# Patient Record
Sex: Female | Born: 1960 | Race: White | Hispanic: No | Marital: Single | Smoking: Former smoker
Health system: Southern US, Community
[De-identification: ages and names within clinical notes are randomized; demographics above are authoritative.]

## PROBLEM LIST (undated history)

## (undated) DIAGNOSIS — I10 Essential (primary) hypertension: Secondary | ICD-10-CM

## (undated) DIAGNOSIS — I509 Heart failure, unspecified: Secondary | ICD-10-CM

## (undated) DIAGNOSIS — J449 Chronic obstructive pulmonary disease, unspecified: Secondary | ICD-10-CM

## (undated) DIAGNOSIS — I2510 Atherosclerotic heart disease of native coronary artery without angina pectoris: Secondary | ICD-10-CM

## (undated) HISTORY — DX: Heart failure, unspecified: I50.9

## (undated) HISTORY — PX: ABDOMINAL HYSTERECTOMY: SHX81

---

## 2014-04-05 ENCOUNTER — Emergency Department: Payer: Self-pay | Admitting: Emergency Medicine

## 2014-04-05 LAB — TROPONIN I: Troponin-I: 0.76 ng/mL — ABNORMAL HIGH

## 2014-04-05 LAB — BASIC METABOLIC PANEL
Anion Gap: 5 — ABNORMAL LOW (ref 7–16)
BUN: 15 mg/dL (ref 7–18)
Calcium, Total: 8.2 mg/dL — ABNORMAL LOW (ref 8.5–10.1)
Chloride: 107 mmol/L (ref 98–107)
Co2: 29 mmol/L (ref 21–32)
Creatinine: 0.68 mg/dL (ref 0.60–1.30)
Glucose: 89 mg/dL (ref 65–99)
Osmolality: 282 (ref 275–301)
Potassium: 4.6 mmol/L (ref 3.5–5.1)
Sodium: 141 mmol/L (ref 136–145)

## 2014-04-05 LAB — CBC
HCT: 41.8 % (ref 35.0–47.0)
HGB: 13.8 g/dL (ref 12.0–16.0)
MCH: 29.4 pg (ref 26.0–34.0)
MCHC: 33 g/dL (ref 32.0–36.0)
MCV: 89 fL (ref 80–100)
Platelet: 200 10*3/uL (ref 150–440)
RBC: 4.69 10*6/uL (ref 3.80–5.20)
RDW: 13.6 % (ref 11.5–14.5)
WBC: 7.8 10*3/uL (ref 3.6–11.0)

## 2014-04-05 LAB — CK-MB: CK-MB: 5.2 ng/mL — ABNORMAL HIGH (ref 0.5–3.6)

## 2016-01-29 ENCOUNTER — Ambulatory Visit
Admission: RE | Admit: 2016-01-29 | Discharge: 2016-01-29 | Disposition: A | Discharge status: Home / Self Care | Payer: Self-pay | Source: Ambulatory Visit | Attending: Internal Medicine | Admitting: Internal Medicine

## 2016-01-29 ENCOUNTER — Ambulatory Visit: Payer: Self-pay | Attending: Internal Medicine

## 2016-01-29 VITALS — BP 113/78 | HR 87 | Temp 98.4°F | Ht 65.35 in | Wt 198.0 lb

## 2016-01-29 DIAGNOSIS — Z0000 Encounter for general adult medical examination without abnormal findings: Secondary | ICD-10-CM

## 2016-01-29 NOTE — Progress Notes (Signed)
Subjective:     Patient ID: Virginia Norman, female   DOB: 1961/03/04, 55 y.o.   MRN: [id number]  HPI   Review of Systems     Objective:   Physical Exam  Pulmonary/Chest: Right breast exhibits no inverted nipple, no mass, no nipple discharge, no skin change and no tenderness. Left breast exhibits no inverted nipple, no mass, no nipple discharge, no skin change and no tenderness. Breasts are symmetrical.       Assessment:     54 year oldpatient presents for BCCCP clinic visitPatient screened, and meets BCCCP eligibility.  Patient does not have insurance, Medicare or Medicaid.  Handout given on Affordable Care Act.  Instructed patient on breast self-exam using teach back method. CBE unremarkable.  No mass or lump palpated.  Patient moved her from [place] in 2001.  States she had mammogram at [facility] about 2005.  She had a total hysterectomy in 2012.    Plan:     Sent for bilateral screening mammogram.

## 2016-02-19 NOTE — Progress Notes (Signed)
Letter mailed from [facility] to notify of normal mammogram results.  Patient to return in one year for annual screening.  Copy to [facility]IS. 

## 2017-09-15 ENCOUNTER — Encounter (INDEPENDENT_AMBULATORY_CARE_PROVIDER_SITE_OTHER): Payer: Self-pay

## 2017-09-15 ENCOUNTER — Ambulatory Visit: Payer: Self-pay | Attending: Oncology | Admitting: *Deleted

## 2017-09-15 ENCOUNTER — Ambulatory Visit
Admission: RE | Admit: 2017-09-15 | Discharge: 2017-09-15 | Disposition: A | Discharge status: Home / Self Care | Payer: Self-pay | Source: Ambulatory Visit | Attending: Oncology | Admitting: Oncology

## 2017-09-15 VITALS — BP 155/94 | HR 80 | Temp 98.6°F | Ht 66.0 in | Wt 197.0 lb

## 2017-09-15 DIAGNOSIS — Z0000 Encounter for general adult medical examination without abnormal findings: Secondary | ICD-10-CM

## 2017-09-15 NOTE — Progress Notes (Signed)
Subjective:     Patient ID: Virginia Norman, female   DOB: 28-Jun-1961, 57 y.o.   MRN: [id number]  HPI   Review of Systems     Objective:   Physical Exam  Pulmonary/Chest: Right breast exhibits no inverted nipple, no mass, no nipple discharge, no skin change and no tenderness. Left breast exhibits no inverted nipple, no mass, no nipple discharge, no skin change and no tenderness. Breasts are asymmetrical.  Right breast larger than the left       Assessment:     [age] year White [facility]  Clinical breast exam unremarkable.  Taught self breast awareness.  Patient has a history of hysterectomy for a prolapsed uterus.  Pap smear deferred per protocol.  Patient has been screened for eligibility.  She does not have any insurance, Medicare or Medicaid.  She also meets financial eligibility.  Hand-out given on the Affordable Care Act.    Plan:     Screening mammogram ordered.  Will follow-up per BCCCP protocol.

## 2017-09-15 NOTE — Patient Instructions (Signed)
Gave patient hand-out, Women Staying Healthy, Active and Well from BCCCP, with education on breast health, pap smears, heart and colon health. 

## 2017-09-16 ENCOUNTER — Encounter: Payer: Self-pay | Admitting: *Deleted

## 2017-09-16 NOTE — Progress Notes (Signed)
Letter mailed from [facility] to inform patient of her normal mammogram results.  Patient is to follow-up with annual screening in one year.  HSIS to [place]y. 

## 2018-04-25 ENCOUNTER — Encounter: Payer: Self-pay | Admitting: *Deleted

## 2018-04-25 ENCOUNTER — Telehealth: Payer: Self-pay | Admitting: *Deleted

## 2018-04-25 DIAGNOSIS — Z122 Encounter for screening for malignant neoplasm of respiratory organs: Secondary | ICD-10-CM

## 2018-04-25 NOTE — Telephone Encounter (Signed)
Received a referral for initial lung cancer screening scan.  Contacted the patient and obtained their smoking history, current smoker trying to quit smoking 0.25 ppd at present with 33 pkyr history   as well as answering questions related to screening process.  Patient denies signs of lung cancer such as weight loss or hemoptysis at this time.  Patient denies comorbidity that would prevent curative treatment if lung cancer were found.  Patient is scheduled for the Shared Decision Making Visit and CT scan on 05-24-18@1345 .

## 2018-05-24 ENCOUNTER — Inpatient Hospital Stay: Payer: Self-pay | Attending: Nurse Practitioner | Admitting: Nurse Practitioner

## 2018-05-24 ENCOUNTER — Ambulatory Visit
Admission: RE | Admit: 2018-05-24 | Discharge: 2018-05-24 | Disposition: A | Discharge status: Home / Self Care | Payer: Self-pay | Source: Ambulatory Visit | Attending: Oncology | Admitting: Oncology

## 2018-05-24 DIAGNOSIS — Z87891 Personal history of nicotine dependence: Secondary | ICD-10-CM

## 2018-05-24 DIAGNOSIS — J438 Other emphysema: Secondary | ICD-10-CM | POA: Insufficient documentation

## 2018-05-24 DIAGNOSIS — I700 Atherosclerosis of aorta: Secondary | ICD-10-CM | POA: Insufficient documentation

## 2018-05-24 DIAGNOSIS — Z122 Encounter for screening for malignant neoplasm of respiratory organs: Secondary | ICD-10-CM

## 2018-05-24 DIAGNOSIS — I358 Other nonrheumatic aortic valve disorders: Secondary | ICD-10-CM | POA: Insufficient documentation

## 2018-05-24 DIAGNOSIS — F17210 Nicotine dependence, cigarettes, uncomplicated: Secondary | ICD-10-CM | POA: Insufficient documentation

## 2018-05-24 DIAGNOSIS — I2510 Atherosclerotic heart disease of native coronary artery without angina pectoris: Secondary | ICD-10-CM | POA: Insufficient documentation

## 2018-05-24 DIAGNOSIS — J432 Centrilobular emphysema: Secondary | ICD-10-CM | POA: Insufficient documentation

## 2018-05-24 NOTE — Progress Notes (Signed)
In accordance with CMS guidelines, patient has met eligibility criteria including age, absence of signs or symptoms of lung cancer.  Social History   Tobacco Use  . Smoking status: Current Every Day Smoker    Packs/day: 0.75    Years: 44.00    Pack years: 33.00    Types: Cigarettes  Substance Use Topics  . Alcohol use: Not on file  . Drug use: Not on file      A shared decision-making session was conducted prior to the performance of CT scan. This includes one or more decision aids, includes benefits and harms of screening, follow-up diagnostic testing, over-diagnosis, false positive rate, and total radiation exposure.   Counseling on the importance of adherence to annual lung cancer LDCT screening, impact of co-morbidities, and ability or willingness to undergo diagnosis and treatment is imperative for compliance of the program.   Counseling on the importance of continued smoking cessation for former smokers; the importance of smoking cessation for current smokers, and information about tobacco cessation interventions have been given to patient including [place] [facility] and 1800 quit [place] programs.   Written order for lung cancer screening with LDCT has been given to the patient and any and all questions have been answered to the best of my abilities.    Yearly follow up will be coordinated by Shawn Perkins, Thoracic Navigator.   , DNP, AGNP-C [facility] [phone] (work cell) [phone] (office) 05/24/18 3:40 PM   

## 2018-05-27 ENCOUNTER — Encounter: Payer: Self-pay | Admitting: *Deleted

## 2019-05-17 ENCOUNTER — Telehealth: Payer: Self-pay

## 2019-05-17 DIAGNOSIS — Z122 Encounter for screening for malignant neoplasm of respiratory organs: Secondary | ICD-10-CM

## 2019-05-17 DIAGNOSIS — Z87891 Personal history of nicotine dependence: Secondary | ICD-10-CM

## 2019-05-17 NOTE — Telephone Encounter (Signed)
Patient has been notified that lung cancer screening CT scan is due currently or will be in near future. Confirmed that patient is within the appropriate age range, and asymptomatic, (no signs or symptoms of lung cancer). Patient denies illness that would prevent curative treatment for lung cancer if found. Verified smoking history (currently 1/2 ppd) Patient is agreeable for CT scan being scheduled. She prefers after 3:00pm if possible, any day.

## 2019-05-25 ENCOUNTER — Telehealth: Payer: Self-pay | Admitting: *Deleted

## 2019-05-25 NOTE — Addendum Note (Signed)
Addended by: Lieutenant Diego on: 05/25/2019 12:20 PM   Modules accepted: Orders

## 2019-05-25 NOTE — Telephone Encounter (Signed)
Smoking hx: 33.5 pack year

## 2019-05-25 NOTE — Telephone Encounter (Signed)
Left voicemail in attempt to give apt for lung screening scan.

## 2019-05-25 NOTE — Telephone Encounter (Signed)
error 

## 2019-05-29 ENCOUNTER — Other Ambulatory Visit: Payer: Self-pay

## 2019-05-29 ENCOUNTER — Ambulatory Visit
Admission: RE | Admit: 2019-05-29 | Discharge: 2019-05-29 | Disposition: A | Discharge status: Home / Self Care | Payer: Self-pay | Source: Ambulatory Visit | Attending: Nurse Practitioner | Admitting: Nurse Practitioner

## 2019-05-29 DIAGNOSIS — Z122 Encounter for screening for malignant neoplasm of respiratory organs: Secondary | ICD-10-CM | POA: Insufficient documentation

## 2019-05-29 DIAGNOSIS — Z87891 Personal history of nicotine dependence: Secondary | ICD-10-CM | POA: Insufficient documentation

## 2019-06-05 ENCOUNTER — Encounter: Payer: Self-pay | Admitting: *Deleted

## 2019-10-02 ENCOUNTER — Emergency Department: Payer: Self-pay

## 2019-10-02 ENCOUNTER — Inpatient Hospital Stay
Admission: EM | Admit: 2019-10-02 | Discharge: 2019-10-09 | DRG: 190 | Disposition: A | Discharge status: Home / Self Care | Payer: Self-pay | Attending: Internal Medicine | Admitting: Internal Medicine

## 2019-10-02 ENCOUNTER — Other Ambulatory Visit: Payer: Self-pay

## 2019-10-02 ENCOUNTER — Encounter: Payer: Self-pay | Admitting: Emergency Medicine

## 2019-10-02 DIAGNOSIS — D72829 Elevated white blood cell count, unspecified: Secondary | ICD-10-CM | POA: Diagnosis not present

## 2019-10-02 DIAGNOSIS — I2510 Atherosclerotic heart disease of native coronary artery without angina pectoris: Secondary | ICD-10-CM | POA: Diagnosis present

## 2019-10-02 DIAGNOSIS — J441 Chronic obstructive pulmonary disease with (acute) exacerbation: Principal | ICD-10-CM | POA: Diagnosis present

## 2019-10-02 DIAGNOSIS — R0902 Hypoxemia: Secondary | ICD-10-CM

## 2019-10-02 DIAGNOSIS — R0602 Shortness of breath: Secondary | ICD-10-CM

## 2019-10-02 DIAGNOSIS — I248 Other forms of acute ischemic heart disease: Secondary | ICD-10-CM | POA: Diagnosis present

## 2019-10-02 DIAGNOSIS — J9601 Acute respiratory failure with hypoxia: Secondary | ICD-10-CM | POA: Diagnosis present

## 2019-10-02 DIAGNOSIS — T380X5A Adverse effect of glucocorticoids and synthetic analogues, initial encounter: Secondary | ICD-10-CM | POA: Diagnosis not present

## 2019-10-02 DIAGNOSIS — Z20822 Contact with and (suspected) exposure to covid-19: Secondary | ICD-10-CM | POA: Diagnosis present

## 2019-10-02 DIAGNOSIS — I5031 Acute diastolic (congestive) heart failure: Secondary | ICD-10-CM

## 2019-10-02 DIAGNOSIS — J9621 Acute and chronic respiratory failure with hypoxia: Secondary | ICD-10-CM

## 2019-10-02 DIAGNOSIS — I10 Essential (primary) hypertension: Secondary | ICD-10-CM | POA: Diagnosis present

## 2019-10-02 DIAGNOSIS — R7303 Prediabetes: Secondary | ICD-10-CM | POA: Diagnosis present

## 2019-10-02 DIAGNOSIS — F17210 Nicotine dependence, cigarettes, uncomplicated: Secondary | ICD-10-CM | POA: Diagnosis present

## 2019-10-02 DIAGNOSIS — I110 Hypertensive heart disease with heart failure: Secondary | ICD-10-CM | POA: Diagnosis present

## 2019-10-02 HISTORY — DX: Chronic obstructive pulmonary disease, unspecified: J44.9

## 2019-10-02 HISTORY — DX: Essential (primary) hypertension: I10

## 2019-10-02 HISTORY — DX: Atherosclerotic heart disease of native coronary artery without angina pectoris: I25.10

## 2019-10-02 LAB — RESPIRATORY PANEL BY RT PCR (FLU A&B, COVID)
Influenza A by PCR: NEGATIVE
Influenza B by PCR: NEGATIVE
SARS Coronavirus 2 by RT PCR: NEGATIVE

## 2019-10-02 LAB — CBC
HCT: 52.2 % — ABNORMAL HIGH (ref 36.0–46.0)
Hemoglobin: 16.1 g/dL — ABNORMAL HIGH (ref 12.0–15.0)
MCH: 29.2 pg (ref 26.0–34.0)
MCHC: 30.8 g/dL (ref 30.0–36.0)
MCV: 94.7 fL (ref 80.0–100.0)
Platelets: 212 10*3/uL (ref 150–400)
RBC: 5.51 MIL/uL — ABNORMAL HIGH (ref 3.87–5.11)
RDW: 14.6 % (ref 11.5–15.5)
WBC: 8.9 10*3/uL (ref 4.0–10.5)
nRBC: 0 % (ref 0.0–0.2)

## 2019-10-02 LAB — BASIC METABOLIC PANEL
Anion gap: 8 (ref 5–15)
BUN: 17 mg/dL (ref 6–20)
CO2: 36 mmol/L — ABNORMAL HIGH (ref 22–32)
Calcium: 8.1 mg/dL — ABNORMAL LOW (ref 8.9–10.3)
Chloride: 92 mmol/L — ABNORMAL LOW (ref 98–111)
Creatinine, Ser: 0.44 mg/dL (ref 0.44–1.00)
GFR calc Af Amer: >60 mL/min (ref >60)
GFR calc non Af Amer: >60 mL/min (ref >60)
Glucose, Bld: 119 mg/dL — ABNORMAL HIGH (ref 70–99)
Potassium: 4.1 mmol/L (ref 3.5–5.1)
Sodium: 136 mmol/L (ref 135–145)

## 2019-10-02 LAB — BRAIN NATRIURETIC PEPTIDE: B Natriuretic Peptide: 454 pg/mL — ABNORMAL HIGH (ref 0.0–100.0)

## 2019-10-02 LAB — TROPONIN I (HIGH SENSITIVITY)
Troponin I (High Sensitivity): 44 ng/L — ABNORMAL HIGH (ref <18)
Troponin I (High Sensitivity): 39 ng/L — ABNORMAL HIGH (ref <18)

## 2019-10-02 LAB — MAGNESIUM: Magnesium: 2.4 mg/dL (ref 1.7–2.4)

## 2019-10-02 MED ORDER — SODIUM CHLORIDE 0.9 % IV SOLN
1.0000 g | INTRAVENOUS | Status: DC
  Administered 2019-10-02: 1 g via INTRAVENOUS
  Filled 2019-10-02 (×2): qty 10

## 2019-10-02 MED ORDER — SODIUM CHLORIDE 0.9 % IV SOLN: INTRAVENOUS | Status: DC

## 2019-10-02 MED ORDER — IPRATROPIUM-ALBUTEROL 0.5-2.5 (3) MG/3ML IN SOLN
3.0000 mL | Freq: Once | RESPIRATORY_TRACT | Status: AC
  Administered 2019-10-02: 3 mL via RESPIRATORY_TRACT
  Filled 2019-10-02: qty 3

## 2019-10-02 MED ORDER — ENOXAPARIN SODIUM 40 MG/0.4ML ~~LOC~~ SOLN
40.0000 mg | SUBCUTANEOUS | Status: DC
  Administered 2019-10-02 – 2019-10-08 (×7): 40 mg via SUBCUTANEOUS
  Filled 2019-10-02 (×7): qty 0.4

## 2019-10-02 MED ORDER — HYDROCHLOROTHIAZIDE 25 MG PO TABS
25.0000 mg | ORAL_TABLET | Freq: Every day | ORAL | Status: DC
  Administered 2019-10-03 – 2019-10-09 (×7): 25 mg via ORAL
  Filled 2019-10-02 (×7): qty 1

## 2019-10-02 MED ORDER — IPRATROPIUM-ALBUTEROL 0.5-2.5 (3) MG/3ML IN SOLN
3.0000 mL | Freq: Four times a day (QID) | RESPIRATORY_TRACT | Status: DC
  Administered 2019-10-03 – 2019-10-04 (×6): 3 mL via RESPIRATORY_TRACT
  Filled 2019-10-02 (×7): qty 3

## 2019-10-02 MED ORDER — IPRATROPIUM-ALBUTEROL 0.5-2.5 (3) MG/3ML IN SOLN
3.0000 mL | Freq: Once | RESPIRATORY_TRACT | Status: AC
Start: 2019-10-02 — End: 2019-10-02
  Administered 2019-10-02: 3 mL via RESPIRATORY_TRACT
  Filled 2019-10-02: qty 3

## 2019-10-02 MED ORDER — PREDNISONE 20 MG PO TABS: 40.0000 mg | ORAL_TABLET | Freq: Every day | ORAL | Status: DC

## 2019-10-02 MED ORDER — PRAVASTATIN SODIUM 20 MG PO TABS
20.0000 mg | ORAL_TABLET | Freq: Every day | ORAL | Status: DC
  Administered 2019-10-03 – 2019-10-08 (×6): 20 mg via ORAL
  Filled 2019-10-02 (×6): qty 1

## 2019-10-02 MED ORDER — METHYLPREDNISOLONE SODIUM SUCC 125 MG IJ SOLR
125.0000 mg | Freq: Once | INTRAMUSCULAR | Status: AC
  Administered 2019-10-02: 125 mg via INTRAVENOUS
  Filled 2019-10-02: qty 2

## 2019-10-02 MED ORDER — LISINOPRIL 20 MG PO TABS
20.0000 mg | ORAL_TABLET | Freq: Every day | ORAL | Status: DC
  Administered 2019-10-03 – 2019-10-05 (×3): 20 mg via ORAL
  Filled 2019-10-02 (×3): qty 1

## 2019-10-02 MED ORDER — METHYLPREDNISOLONE SODIUM SUCC 125 MG IJ SOLR
60.0000 mg | Freq: Four times a day (QID) | INTRAMUSCULAR | Status: DC
  Administered 2019-10-02 – 2019-10-03 (×2): 60 mg via INTRAVENOUS
  Filled 2019-10-02 (×2): qty 2

## 2019-10-02 NOTE — H&P (Signed)
History and Physical   Virginia Norman INO:[id number] DOB: [birth year] DOA: 10/02/2019  Referring MD/NP/PA: Dr. Derrill Kay  PCP: Martie Round, NP   Outpatient Specialists: None  Patient coming from: Home  Chief Complaint: Shortness  HPI: Virginia Norman is a 59 y.o. female with medical history significant of COPD, hypertension, coronary artery disease who presented with progressive shortness of breath cough and wheezing for the last 3 days.  Patient continues to smoke until 3 days ago.  She has not smoked and feels like she is done smoking.  She was seen in the ER with significant use of accessory muscles of respiration.  She was tachypneic.  Initiated on IV steroids with nebulizer treatment and oxygen.  Patient has done much better.  She is now stable on 2 L.  No fever or chills no nausea vomiting or diarrhea.  She denied any PND orthopnea and no evidence of fluid overload.  Patient is being admitted with acute exacerbation of COPD.  ED Course: Temperature 98.3 blood pressure of 41/87 pulse 92 respirate 34 oxygen sat 61% on room air and currently 96% on 2 L.  White count is 8.9 hemoglobin 16.9 platelets 212.  Sodium 136 potassium 4.1 chloride 92 CO2 of 36 BUN 17 creatinine 0.41 calcium 8.1.  Glucose is 119.  COVID-19 screen is negative.  Chest x-ray showed no infiltrates but some bilateral small pleural effusions.  Patient being admitted with acute respiratory failure secondary to COPD exacerbation  Review of Systems: As per HPI otherwise 10 point review of systems negative.    Past Medical History:  Diagnosis Date  . COPD (chronic obstructive pulmonary disease) (HCC)   . Coronary artery disease   . Hypertension     Past Surgical History:  Procedure Laterality Date  . ABDOMINAL HYSTERECTOMY       reports that she has been smoking cigarettes. She has a 33.00 pack-year smoking history. She has never used smokeless tobacco. She reports that she does not drink alcohol or use drugs.  No  Known Allergies  No family history on file.   Prior to Admission medications   Not on File    Physical Exam: Vitals:   10/02/19 1637 10/02/19 1715 10/02/19 1730 10/02/19 1747  BP: (!) 178/85  (!) 164/99   Pulse: 78 80 78 77  Resp: (!) 25 (!) 22 16 (!) 30  Temp:      TempSrc:      SpO2: 95% 96% 92% 93%  Weight:      Height:          Constitutional: Anxious, in respiratory distress Vitals:   10/02/19 1637 10/02/19 1715 10/02/19 1730 10/02/19 1747  BP: (!) 178/85  (!) 164/99   Pulse: 78 80 78 77  Resp: (!) 25 (!) 22 16 (!) 30  Temp:      TempSrc:      SpO2: 95% 96% 92% 93%  Weight:      Height:       Eyes: PERRL, lids and conjunctivae normal ENMT: Mucous membranes are moist. Posterior pharynx clear of any exudate or lesions.Normal dentition.  Neck: normal, supple, no masses, no thyromegaly Respiratory: Decreased air entry bilaterally with marked expiratory wheezing, no crackles, use of accessory muscles of respiration Cardiovascular: Regular rate and rhythm, no murmurs / rubs / gallops. No extremity edema. 2+ pedal pulses. No carotid bruits.  Abdomen: no tenderness, no masses palpated. No hepatosplenomegaly. Bowel sounds positive.  Musculoskeletal: no clubbing / cyanosis. No joint deformity upper and lower extremities. Good  ROM, no contractures. Normal muscle tone.  Skin: no rashes, lesions, ulcers. No induration Neurologic: CN 2-12 grossly intact. Sensation intact, DTR normal. Strength 5/5 in all 4.  Psychiatric: Normal judgment and insight. Alert and oriented x 3.  Anxious mood.     Labs on Admission: I have personally reviewed following labs and imaging studies  CBC: Recent Labs  Lab 10/02/19 1551  WBC 8.9  HGB 16.1*  HCT 52.2*  MCV 94.7  PLT 212   Basic Metabolic Panel: Recent Labs  Lab 10/02/19 1551  NA 136  K 4.1  CL 92*  CO2 36*  GLUCOSE 119*  BUN 17  CREATININE 0.44  CALCIUM 8.1*   GFR: Estimated Creatinine Clearance: 87.6 mL/min (by C-G  formula based on SCr of 0.44 mg/dL). Liver Function Tests: No results for input(s): AST, ALT, ALKPHOS, BILITOT, PROT, ALBUMIN in the last 168 hours. No results for input(s): LIPASE, AMYLASE in the last 168 hours. No results for input(s): AMMONIA in the last 168 hours. Coagulation Profile: No results for input(s): INR, PROTIME in the last 168 hours. Cardiac Enzymes: No results for input(s): CKTOTAL, CKMB, CKMBINDEX, TROPONINI in the last 168 hours. BNP (last 3 results) No results for input(s): PROBNP in the last 8760 hours. HbA1C: No results for input(s): HGBA1C in the last 72 hours. CBG: No results for input(s): GLUCAP in the last 168 hours. Lipid Profile: No results for input(s): CHOL, HDL, LDLCALC, TRIG, CHOLHDL, LDLDIRECT in the last 72 hours. Thyroid Function Tests: No results for input(s): TSH, T4TOTAL, FREET4, T3FREE, THYROIDAB in the last 72 hours. Anemia Panel: No results for input(s): VITAMINB12, FOLATE, FERRITIN, TIBC, IRON, RETICCTPCT in the last 72 hours. Urine analysis: No results found for: COLORURINE, APPEARANCEUR, LABSPEC, PHURINE, GLUCOSEU, HGBUR, BILIRUBINUR, KETONESUR, PROTEINUR, UROBILINOGEN, NITRITE, LEUKOCYTESUR Sepsis Labs: @LABRCNTIP (procalcitonin:4,lacticidven:4) ) Recent Results (from the past 240 hour(s))  Respiratory Panel by RT PCR (Flu A&B, Covid) - Nasopharyngeal Swab     Status: None   Collection Time: 10/02/19  4:53 PM   Specimen: Nasopharyngeal Swab  Result Value Ref Range Status   SARS Coronavirus 2 by RT PCR NEGATIVE NEGATIVE Final    Comment: (NOTE) SARS-CoV-2 target nucleic acids are NOT DETECTED. The SARS-CoV-2 RNA is generally detectable in upper respiratoy specimens during the acute phase of infection. The lowest concentration of SARS-CoV-2 viral copies this assay can detect is 131 copies/mL. A negative result does not preclude SARS-Cov-2 infection and should not be used as the sole basis for treatment or other patient management  decisions. A negative result may occur with  improper specimen collection/handling, submission of specimen other than nasopharyngeal swab, presence of viral mutation(s) within the areas targeted by this assay, and inadequate number of viral copies (<131 copies/mL). A negative result must be combined with clinical observations, patient history, and epidemiological information. The expected result is Negative. Fact Sheet for Patients:  10/04/19 Fact Sheet for Healthcare Providers:  https://www.moore.com/ This test is not yet ap proved or cleared by the https://www.young.biz/ FDA and  has been authorized for detection and/or diagnosis of SARS-CoV-2 by FDA under an Emergency Use Authorization (EUA). This EUA will remain  in effect (meaning this test can be used) for the duration of the COVID-19 declaration under Section 564(b)(1) of the Act, 21 U.S.C. section 360bbb-3(b)(1), unless the authorization is terminated or revoked sooner.    Influenza A by PCR NEGATIVE NEGATIVE Final   Influenza B by PCR NEGATIVE NEGATIVE Final    Comment: (NOTE) The Xpert Xpress SARS-CoV-2/FLU/RSV assay  is intended as an aid in  the diagnosis of influenza from Nasopharyngeal swab specimens and  should not be used as a sole basis for treatment. Nasal washings and  aspirates are unacceptable for Xpert Xpress SARS-CoV-2/FLU/RSV  testing. Fact Sheet for Patients: PinkCheek.be Fact Sheet for Healthcare Providers: GravelBags.it This test is not yet approved or cleared by the [place] FDA and  has been authorized for detection and/or diagnosis of SARS-CoV-2 by  FDA under an Emergency Use Authorization (EUA). This EUA will remain  in effect (meaning this test can be used) for the duration of the  Covid-19 declaration under Section 564(b)(1) of the Act, 21  U.S.C. section 360bbb-3(b)(1), unless the authorization  is  terminated or revoked. Performed at [facility], [address], [place], [place] [address]      Radiological Exams on Admission: DG Chest 2 View  Result Date: 10/02/2019 CLINICAL DATA:  Hypoxia EXAM: CHEST - 2 VIEW COMPARISON:  04/05/2014 FINDINGS: Small bilateral pleural effusions. Enlarged cardiomediastinal silhouette with mild vascular congestion. No consolidation or pneumothorax. Aortic atherosclerosis. IMPRESSION: Cardiomegaly with central vascular congestion and small bilateral pleural effusions. Electronically Signed   By: Donavan Foil M.D.   On: 10/02/2019 16:30    EKG: Independently reviewed.  Normal sinus rhythm with nonspecific ST changes  Assessment/Plan Principal Problem:   COPD exacerbation (HCC) Active Problems:   Benign essential HTN   CAD (coronary artery disease)     #1 acute respiratory failure secondary to COPD exacerbation: Patient will be admitted and initiated on IV steroids nebulizer treatment and antibiotics.  Oxygen as well as mobilization with PT.  #2 tobacco abuse: Patient has decided to quit smoking.  Has not smoked in 3 days.  Offered nicotine patch but at this point declined.  Continue monitoring  #3 hypertension: Confirm and resume home regimen  #4 coronary artery disease: Appears to be stable at bedside.  No evidence of decompensation.  Her troponin is 44 and BNP 454.  Continue on telemetric   DVT prophylaxis: Lovenox Code Status: Full code Family Communication: No family at bedside Disposition Plan: Home Consults called: None Admission status: Observation  Severity of Illness: The appropriate patient status for this patient is OBSERVATION. Observation status is judged to be reasonable and necessary in order to provide the required intensity of service to ensure the patient's safety. The patient's presenting symptoms, physical exam findings, and initial radiographic and laboratory data in the context of their medical condition  is felt to place them at decreased risk for further clinical deterioration. Furthermore, it is anticipated that the patient will be medically stable for discharge from the hospital within 2 midnights of admission. The following factors support the patient status of observation.   " The patient's presenting symptoms include shortness of breath and wheeze. " The physical exam findings include hypoxemia on room air. " The initial radiographic and laboratory data are no significant finding on chest x-ray.     Barbette Merino MD [facility] Pager [phone]  If 7PM-7AM, please contact night-coverage [email or web address] Password [facility]  10/02/2019, 6:15 PM

## 2019-10-02 NOTE — Progress Notes (Addendum)
Called from nursing with concern regarding 2-3+ pitting edema on bilateral lower extremities with IV fluid order.  Also reports hypertension.  Additionally had to increase supplemental oxygen from 4 L to 5 L to obtain sats above 90%.  Bedside exam patient without shortness of breath breathing easily in upright position.  S1-S2.  Type II systolic murmur.   Patient reports called physician office today regarding edema in bilateral lower extremities that did not improve with lower extremity elevation as normally at home.  Normally she reports dependent edema throughout the days she is have a job of standing for 10 to 12 hours.  She reports edema usually subsides by morning.  She says it has not subsided all weekend.  Patient denies shortness of breath.  She denies productive cough.  She does report history of hypertension that has been treated with medications for the past 5 years.  She also reports chest pain work-up about 5 or 6 years ago in which she had a stress test done in [place] that was according to her fine.  She denies any frequent problems with chest pain.  She also reports she has a heart murmur that is not new.  (Echocardiogram: From 2015 - echo LVEF>55% and mild MR and TR  She denies any recent changes in medications.  She does admit to not smoking for the last 3 days.  She also reports low-dose CT scan for cancer screening done last year.  Review of labs troponin elevated 44 initially.  BNP elevated in the 400s.  Cardiomegaly on chest x-ray with central pulmonary vascular congestion and small bilateral pleural effusions.  Low-dose CT scan noted last year with pertinent positive of 3 vessel cardiac disease.  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 Patient reports recent work-up at primary care office with complete lab works and she is already on statin therapy.  Will not change intensity.     Patient was given a dose of IV Lasix.  Also started daily  aspirin.  An echo has been ordered.  Will continue to monitor on telemetry and continuous pulse ox.  Also will need to have cardiology consult if indicative by echo.  Continue treatments for mild COPD. -Procalcitonin added to a.m. labs  Also noted elevated blood sugars on presentation.  Will check hemoglobin A1c.  Will start sliding scale insulin as blood sugars are expected to be elevated now with steroid therapy.

## 2019-10-02 NOTE — ED Provider Notes (Signed)
[facility] Emergency Department Provider Note   ____________________________________________   I have reviewed the triage vital signs and the nursing notes.   HISTORY  Chief Complaint Shortness of Breath   History limited by: Not Limited   HPI Virginia Norman is a 59 y.o. female who presents to the emergency department today because of concern for shortness of breath. Patient states she has a history of COPD. Has been out of her spiriva for roughly 1 week. Had only been using it when needed. The patient states that starting 3 days ago she started feeling more shortness of breath. This has been associated with some discomfort in her left upper chest. The patient has noticed that she has had to sleep in her recliner and that the shortness of breath is worse with exertion. Denies any fevers.   Records reviewed. Per medical record review patient has a history of COPD, CAD, HTN.   Past Medical History:  Diagnosis Date  . COPD (chronic obstructive pulmonary disease) (HCC)   . Coronary artery disease   . Hypertension     There are no problems to display for this patient.   Past Surgical History:  Procedure Laterality Date  . ABDOMINAL HYSTERECTOMY      Prior to Admission medications   Not on File    Allergies Patient has no known allergies.  No family history on file.  Social History Social History   Tobacco Use  . Smoking status: Current Every Day Smoker    Packs/day: 0.75    Years: 44.00    Pack years: 33.00    Types: Cigarettes  . Smokeless tobacco: Never Used  Substance Use Topics  . Alcohol use: Never  . Drug use: Never    Review of Systems Constitutional: No fever/chills Eyes: No visual changes. ENT: No sore throat. Cardiovascular: Positive for chest pain. Respiratory: Positive for shortness of breath. Gastrointestinal: No abdominal pain.  No nausea, no vomiting.  No diarrhea.   Genitourinary: Negative for  dysuria. Musculoskeletal: Negative for back pain. Skin: Negative for rash. Neurological: Negative for headaches, focal weakness or numbness.  ____________________________________________   PHYSICAL EXAM:  VITAL SIGNS: ED Triage Vitals  Enc Vitals Group     BP 10/02/19 1540 (!) 207/107     Pulse Rate 10/02/19 1540 80     Resp 10/02/19 1540 19     Temp 10/02/19 1540 98.3 F (36.8 C)     Temp Source 10/02/19 1540 Oral     SpO2 10/02/19 1540 (!) 77 %     Weight 10/02/19 1541 205 lb (93 kg)     Height 10/02/19 1541 5\' 7"  (1.702 m)     Head Circumference --      Peak Flow --      Pain Score 10/02/19 1540 0   Constitutional: Alert and oriented.  Eyes: Conjunctivae are normal.  ENT      Head: Normocephalic and atraumatic.      Nose: No congestion/rhinnorhea.      Mouth/Throat: Mucous membranes are moist.      Neck: No stridor. Hematological/Lymphatic/Immunilogical: No cervical lymphadenopathy. Cardiovascular: Normal rate, regular rhythm.  No murmurs, rubs, or gallops. Respiratory: Slightly increased work of breathing. Diffuse expiratory wheezing with poor air movement.  Gastrointestinal: Soft and non tender. No rebound. No guarding.  Genitourinary: Deferred Musculoskeletal: Normal range of motion in all extremities. 1+ bilateral lower extremity edema.  Neurologic:  Normal speech and language. No gross focal neurologic deficits are appreciated.  Skin:  Skin  is warm, dry and intact. No rash noted. Psychiatric: Mood and affect are normal. Speech and behavior are normal. Patient exhibits appropriate insight and judgment.  ____________________________________________    LABS (pertinent positives/negatives)  CBC wbc 8.9, hgb 16.1, plt 212 BMP na 136, k 4.1, glu 119, cr 0.44, ca 8.1 BNP 454.0 Trop hs 44 COVID neg ____________________________________________   EKG  I, Nance Pear, attending physician, personally viewed and interpreted this EKG  EKG Time: 1601 Rate:  79 Rhythm: sinus rhythm Axis: normal Intervals: qtc 445 QRS: narrow, q waves II, III, aVF ST changes: no st elevation Impression: abnormal ekg  ____________________________________________    RADIOLOGY  CXR Cardiomegaly with central venous congestion, bilateral small pleural effusion.  ____________________________________________   PROCEDURES  Procedures  ____________________________________________   INITIAL IMPRESSION / ASSESSMENT AND PLAN / ED COURSE  Pertinent labs & imaging results that were available during my care of the patient were reviewed by me and considered in my medical decision making (see chart for details).   Patient presented to the emergency department today because of concern for shortness of breath. On exam patient does have increased work of breathing and diffuse wheezing with decreased air movement. Patient was hypoxic on room air. X-ray without pna or ptx. At this time do think likely COPD exacerbation. Doubt PE. Will give steroids and duoneb treatments. Will plan on admission.   ___________________________________________   FINAL CLINICAL IMPRESSION(S) / ED DIAGNOSES  Final diagnoses:  COPD exacerbation ([place])  Shortness of breath  Hypoxia     Note: This dictation was prepared with Dragon dictation. Any transcriptional errors that result from this process are unintentional     Nance Pear, MD 10/02/19 1759

## 2019-10-02 NOTE — ED Triage Notes (Signed)
Pt in via POV, reports worsening shortness of breath over the weekend; reports being newly dx COPD, being out of Spiriva x approximately 1 week.  Pt ambulatory to triage; hypoxic on room air in 70's.  Pt placed on 2L nasal cannula, no increased work of breathing noted at this time.    Pt also complains of new edema to BLE's.    NAD noted at this time.

## 2019-10-03 ENCOUNTER — Observation Stay
Admit: 2019-10-03 | Discharge: 2019-10-03 | Disposition: A | Discharge status: Home / Self Care | Payer: Self-pay | Attending: Acute Care | Admitting: Acute Care

## 2019-10-03 DIAGNOSIS — I2510 Atherosclerotic heart disease of native coronary artery without angina pectoris: Secondary | ICD-10-CM

## 2019-10-03 LAB — CBC
HCT: 53.3 % — ABNORMAL HIGH (ref 36.0–46.0)
Hemoglobin: 16.5 g/dL — ABNORMAL HIGH (ref 12.0–15.0)
MCH: 29.3 pg (ref 26.0–34.0)
MCHC: 31 g/dL (ref 30.0–36.0)
MCV: 94.7 fL (ref 80.0–100.0)
Platelets: 220 10*3/uL (ref 150–400)
RBC: 5.63 MIL/uL — ABNORMAL HIGH (ref 3.87–5.11)
RDW: 14.6 % (ref 11.5–15.5)
WBC: 9.5 10*3/uL (ref 4.0–10.5)
nRBC: 0.2 % (ref 0.0–0.2)

## 2019-10-03 LAB — COMPREHENSIVE METABOLIC PANEL
ALT: 27 U/L (ref 0–44)
AST: 28 U/L (ref 15–41)
Albumin: 3.5 g/dL (ref 3.5–5.0)
Alkaline Phosphatase: 95 U/L (ref 38–126)
Anion gap: 12 (ref 5–15)
BUN: 15 mg/dL (ref 6–20)
CO2: 36 mmol/L — ABNORMAL HIGH (ref 22–32)
Calcium: 8.4 mg/dL — ABNORMAL LOW (ref 8.9–10.3)
Chloride: 90 mmol/L — ABNORMAL LOW (ref 98–111)
Creatinine, Ser: 0.64 mg/dL (ref 0.44–1.00)
GFR calc Af Amer: >60 mL/min (ref >60)
GFR calc non Af Amer: >60 mL/min (ref >60)
Glucose, Bld: 201 mg/dL — ABNORMAL HIGH (ref 70–99)
Potassium: 4 mmol/L (ref 3.5–5.1)
Sodium: 138 mmol/L (ref 135–145)
Total Bilirubin: 0.5 mg/dL (ref 0.3–1.2)
Total Protein: 6.5 g/dL (ref 6.5–8.1)

## 2019-10-03 LAB — PROCALCITONIN: Procalcitonin: <0.1 ng/mL

## 2019-10-03 LAB — BASIC METABOLIC PANEL
Anion gap: 10 (ref 5–15)
BUN: 15 mg/dL (ref 6–20)
CO2: 39 mmol/L — ABNORMAL HIGH (ref 22–32)
Calcium: 8.4 mg/dL — ABNORMAL LOW (ref 8.9–10.3)
Chloride: 90 mmol/L — ABNORMAL LOW (ref 98–111)
Creatinine, Ser: 0.6 mg/dL (ref 0.44–1.00)
GFR calc Af Amer: >60 mL/min (ref >60)
GFR calc non Af Amer: >60 mL/min (ref >60)
Glucose, Bld: 129 mg/dL — ABNORMAL HIGH (ref 70–99)
Potassium: 4.1 mmol/L (ref 3.5–5.1)
Sodium: 139 mmol/L (ref 135–145)

## 2019-10-03 LAB — HEMOGLOBIN A1C
Hgb A1c MFr Bld: 6.3 % — ABNORMAL HIGH (ref 4.8–5.6)
Mean Plasma Glucose: 134.11 mg/dL

## 2019-10-03 LAB — HIV ANTIBODY (ROUTINE TESTING W REFLEX): HIV Screen 4th Generation wRfx: NONREACTIVE

## 2019-10-03 LAB — TROPONIN I (HIGH SENSITIVITY): Troponin I (High Sensitivity): 32 ng/L — ABNORMAL HIGH (ref <18)

## 2019-10-03 MED ORDER — METHYLPREDNISOLONE SODIUM SUCC 40 MG IJ SOLR
40.0000 mg | Freq: Four times a day (QID) | INTRAMUSCULAR | Status: DC
  Administered 2019-10-03 – 2019-10-06 (×12): 40 mg via INTRAVENOUS
  Filled 2019-10-03 (×12): qty 1

## 2019-10-03 MED ORDER — FUROSEMIDE 10 MG/ML IJ SOLN
40.0000 mg | Freq: Once | INTRAMUSCULAR | Status: AC
  Administered 2019-10-03: 40 mg via INTRAVENOUS
  Filled 2019-10-03: qty 4

## 2019-10-03 MED ORDER — AZITHROMYCIN 250 MG PO TABS
500.0000 mg | ORAL_TABLET | Freq: Every day | ORAL | Status: AC
  Administered 2019-10-03 – 2019-10-07 (×5): 500 mg via ORAL
  Filled 2019-10-03 (×5): qty 2

## 2019-10-03 MED ORDER — METOPROLOL TARTRATE 5 MG/5ML IV SOLN
5.0000 mg | Freq: Once | INTRAVENOUS | Status: AC
  Administered 2019-10-03: 5 mg via INTRAVENOUS
  Filled 2019-10-03: qty 5

## 2019-10-03 MED ORDER — ASPIRIN EC 81 MG PO TBEC
81.0000 mg | DELAYED_RELEASE_TABLET | Freq: Every day | ORAL | Status: DC
  Administered 2019-10-03 – 2019-10-09 (×7): 81 mg via ORAL
  Filled 2019-10-03 (×7): qty 1

## 2019-10-03 MED ORDER — SODIUM CHLORIDE 0.9 % IV SOLN
500.0000 mg | INTRAVENOUS | Status: DC
  Filled 2019-10-03: qty 500

## 2019-10-03 MED ORDER — PERFLUTREN LIPID MICROSPHERE
1.0000 mL | INTRAVENOUS | Status: AC | PRN
  Administered 2019-10-03: 2 mL via INTRAVENOUS
  Filled 2019-10-03: qty 10

## 2019-10-03 NOTE — Evaluation (Addendum)
Physical Therapy Evaluation Patient Details Name: Virginia Norman MRN: [id number] DOB: 05/20/1961 Today's Date: 10/03/2019   History of Present Illness  Virginia Norman is a 13yoF who comes to [facility] on 10/02/19 c LEE, SOB, cough. Pt admitted with CHF exacerbation. PMH: COPD, HTN, CAD. 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  Clinical Impression  Pt admitted with above diagnosis. Pt currently with functional limitations due to the deficits listed below (see "PT Problem List"). Upon entry, pt in bed, awake and agreeable to participate. The pt is alert and oriented x4, pleasant, conversational, and generally a good historian. Bed mobility and transfers performed with independence, but author assists with AMB due to lines/leads and O2 tank. Pt able to AMB on 3L and maintain sats, but on 2L AMB drops to 84%, whereas pt is not on supplemental O2 at baseline. Pt has not clear balance or strength deficits, which she confirms. Pt has minimal SOB with desaturation. Patient is near her baseline level of independence for basic mobility, all education completed, and time is given to address all questions/concerns. No additional skilled PT services needed at this time, PT signing off. PT recommends daily ambulation ad lib or with nursing staff as needed to prevent deconditioning.      Follow Up Recommendations No PT follow up    Equipment Recommendations  None recommended by PT    Recommendations for Other Services       Precautions / Restrictions Precautions Precautions: None Precaution Comments: author accidentally grabbed yellow socks by habit. Restrictions Weight Bearing Restrictions: No      Mobility  Bed Mobility Overal bed mobility: Independent                Transfers Overall transfer level: Independent Equipment used: None                Ambulation/Gait Ambulation/Gait assistance:  Supervision Gait Distance (Feet): 580 Feet Assistive device: None Gait Pattern/deviations: WFL(Within Functional Limits) Gait velocity: 0.48m/s   General Gait Details: author assists with O2 canister; 84% on 2L AMB; 93% AMB on 3L/min  Stairs            Wheelchair Mobility    Modified Rankin (Stroke Patients Only)       Balance Overall balance assessment: Independent;No apparent balance deficits (not formally assessed)                                           Pertinent Vitals/Pain Pain Assessment: No/denies pain    Home Living Family/patient expects to be discharged to:: Private residence Living Arrangements: Spouse/significant other Available Help at Discharge: Family Type of Home: Mobile home Home Access: Stairs to enter;Ramped entrance     Home Layout: One level Home Equipment: None      Prior Function Level of Independence: Independent         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        Extremity/Trunk Assessment   Upper Extremity Assessment Upper Extremity Assessment: Overall WFL for tasks assessed    Lower Extremity Assessment Lower Extremity Assessment: Overall WFL for tasks assessed    Cervical / Trunk Assessment Cervical / Trunk Assessment: Normal  Communication   Communication: No difficulties  Cognition Arousal/Alertness: Awake/alert Behavior During Therapy: WFL for tasks assessed/performed  Overall Cognitive Status: Within Functional Limits for tasks assessed                                        General Comments      Exercises     Assessment/Plan    PT Assessment Patent does not need any further PT services  PT Problem List         PT Treatment Interventions      PT Goals (Current goals can be found in the Care Plan section)  Acute Rehab PT Goals PT Goal Formulation: All assessment and education complete, DC therapy Time For Goal  Achievement: 10/17/19    Frequency     Barriers to discharge        Co-evaluation               AM-PAC PT "6 Clicks" Mobility  Outcome Measure Help needed turning from your back to your side while in a flat bed without using bedrails?: None Help needed moving from lying on your back to sitting on the side of a flat bed without using bedrails?: None Help needed moving to and from a bed to a chair (including a wheelchair)?: None Help needed standing up from a chair using your arms (e.g., wheelchair or bedside chair)?: None Help needed to walk in hospital room?: None Help needed climbing 3-5 steps with a railing? : A Little 6 Click Score: 23    End of Session Equipment Utilized During Treatment: Oxygen Activity Tolerance: Patient tolerated treatment well;No increased pain Patient left: in bed;with call bell/phone within reach Nurse Communication: Mobility status PT Visit Diagnosis: Difficulty in walking, not elsewhere classified (R26.2);Other abnormalities of gait and mobility (R26.89)    Time: 3009-2330 PT Time Calculation (min) (ACUTE ONLY): 27 min   Charges:   PT Evaluation $PT Eval Low Complexity: 1 Low PT Treatments $Therapeutic Exercise: 8-22 mins        10:21 AM, 10/03/19 Rosamaria Lints, PT, DPT Physical Therapist - [facility]  [phone] (ASCOM)    Salwa Bai C 10/03/2019, 10:19 AM

## 2019-10-03 NOTE — Progress Notes (Signed)
PHARMACIST - PHYSICIAN COMMUNICATION  CONCERNING: Antibiotic IV to Oral Route Change Policy  RECOMMENDATION: This patient is receiving azithromycin by the intravenous route.  Based on criteria approved by the Pharmacy and Therapeutics Committee, the antibiotic(s) is/are being converted to the equivalent oral dose form(s).   DESCRIPTION: These criteria include:  Patient being treated for a respiratory tract infection, urinary tract infection, cellulitis or clostridium difficile associated diarrhea if on metronidazole  The patient is not neutropenic and does not exhibit a GI malabsorption state  The patient is eating (either orally or via tube) and/or has been taking other orally administered medications for a least 24 hours  The patient is improving clinically and has a Tmax < 100.5  If you have questions about this conversion, please contact the Pharmacy Department   

## 2019-10-03 NOTE — Progress Notes (Signed)
*  PRELIMINARY RESULTS* Echocardiogram 2D Echocardiogram has been performed.  Virginia Norman 10/03/2019, 11:09 AM

## 2019-10-03 NOTE — Progress Notes (Addendum)
PROGRESS NOTE    Virginia Norman  EYC:[id number] DOB: 1960/08/02 DOA: 10/02/2019 PCP: Martie Round, NP      Assessment & Plan:   Principal Problem:   COPD exacerbation (HCC) Active Problems:   Benign essential HTN   CAD (coronary artery disease)   COPD exacerbation: continue on IV steroids, IV azithromycin, & bronchodilators. Encourage incentive spirometry. Continue on supplemental oxygen and wean as tolerated.  Acute hypoxic respiratory failure: secondary to above. Continue on supplemental oxygen and wean as tolerated. Does not use supplemental oxygen at home. Management as stated above   Tobacco abuse: not smoked in 3 days & wants to quit smoking for good.  Offered nicotine patch but pt declined.    Hypertension: continue on HCTZ, lisinopril  CAD: stable.  No evidence of decompensation.   Pre-DM: HbA1c 6.3, will discuss with pt.   Elevated troponins: likely secondary to demand ischemia. No chest pain. Trending down   DVT prophylaxis: lovenox Code Status: full  Family Communication: Disposition Plan: will likely d/c back home when medically stable   Consultants:      Procedures:   Antimicrobials: azithromycin    Subjective: Pt c/o shortness of breath   Objective: Vitals:   10/03/19 0132 10/03/19 0505 10/03/19 0720 10/03/19 0735  BP:  139/84  136/82  Pulse:  77  73  Resp:  19  18  Temp:  98.1 F (36.7 C)  98.3 F (36.8 C)  TempSrc:  Oral    SpO2: 91% 92% 90% 91%  Weight:  92.9 kg    Height:        Intake/Output Summary (Last 24 hours) at 10/03/2019 0808 Last data filed at 10/03/2019 0558 Gross per 24 hour  Intake 340 ml  Output 1550 ml  Net -1210 ml   Filed Weights   10/02/19 1541 10/02/19 2108 10/03/19 0505  Weight: 93 kg 92.9 kg 92.9 kg    Examination:  General exam: Appears calm and comfortable  Respiratory system: course breath sounds b/l Cardiovascular system: S1 & S2 +. No rubs, gallops or clicks. B/l LE  edema Gastrointestinal system: Abdomen is obese, soft and nontender.  Normal bowel sounds heard. Central nervous system: Alert and oriented. Moves all 4 extremities  Psychiatry: Judgement and insight appear normal. Mood & affect appropriate.     Data Reviewed: I have personally reviewed following labs and imaging studies  CBC: Recent Labs  Lab 10/02/19 1551 10/03/19 0013  WBC 8.9 9.5  HGB 16.1* 16.5*  HCT 52.2* 53.3*  MCV 94.7 94.7  PLT 212 220   Basic Metabolic Panel: Recent Labs  Lab 10/02/19 1551 10/02/19 2151 10/03/19 0013 10/03/19 0445  NA 136  --  138 139  K 4.1  --  4.0 4.1  CL 92*  --  90* 90*  CO2 36*  --  36* 39*  GLUCOSE 119*  --  201* 129*  BUN 17  --  15 15  CREATININE 0.44  --  0.64 0.60  CALCIUM 8.1*  --  8.4* 8.4*  MG  --  2.4  --   --    GFR: Estimated Creatinine Clearance: 87.5 mL/min (by C-G formula based on SCr of 0.6 mg/dL). Liver Function Tests: Recent Labs  Lab 10/03/19 0013  AST 28  ALT 27  ALKPHOS 95  BILITOT 0.5  PROT 6.5  ALBUMIN 3.5   No results for input(s): LIPASE, AMYLASE in the last 168 hours. No results for input(s): AMMONIA in the last 168 hours. Coagulation Profile: No results  for input(s): INR, PROTIME in the last 168 hours. Cardiac Enzymes: No results for input(s): CKTOTAL, CKMB, CKMBINDEX, TROPONINI in the last 168 hours. BNP (last 3 results) No results for input(s): PROBNP in the last 8760 hours. HbA1C: No results for input(s): HGBA1C in the last 72 hours. CBG: No results for input(s): GLUCAP in the last 168 hours. Lipid Profile: No results for input(s): CHOL, HDL, LDLCALC, TRIG, CHOLHDL, LDLDIRECT in the last 72 hours. Thyroid Function Tests: No results for input(s): TSH, T4TOTAL, FREET4, T3FREE, THYROIDAB in the last 72 hours. Anemia Panel: No results for input(s): VITAMINB12, FOLATE, FERRITIN, TIBC, IRON, RETICCTPCT in the last 72 hours. Sepsis Labs: Recent Labs  Lab 10/03/19 0445  PROCALCITON <0.10     Recent Results (from the past 240 hour(s))  Respiratory Panel by RT PCR (Flu A&B, Covid) - Nasopharyngeal Swab     Status: None   Collection Time: 10/02/19  4:53 PM   Specimen: Nasopharyngeal Swab  Result Value Ref Range Status   SARS Coronavirus 2 by RT PCR NEGATIVE NEGATIVE Final    Comment: (NOTE) SARS-CoV-2 target nucleic acids are NOT DETECTED. The SARS-CoV-2 RNA is generally detectable in upper respiratoy specimens during the acute phase of infection. The lowest concentration of SARS-CoV-2 viral copies this assay can detect is 131 copies/mL. A negative result does not preclude SARS-Cov-2 infection and should not be used as the sole basis for treatment or other patient management decisions. A negative result may occur with  improper specimen collection/handling, submission of specimen other than nasopharyngeal swab, presence of viral mutation(s) within the areas targeted by this assay, and inadequate number of viral copies (<131 copies/mL). A negative result must be combined with clinical observations, patient history, and epidemiological information. The expected result is Negative. Fact Sheet for Patients:  https://www.moore.com/ Fact Sheet for Healthcare Providers:  https://www.young.biz/ This test is not yet ap proved or cleared by the [place] FDA and  has been authorized for detection and/or diagnosis of SARS-CoV-2 by FDA under an Emergency Use Authorization (EUA). This EUA will remain  in effect (meaning this test can be used) for the duration of the COVID-19 declaration under Section 564(b)(1) of the Act, 21 U.S.C. section 360bbb-3(b)(1), unless the authorization is terminated or revoked sooner.    Influenza A by PCR NEGATIVE NEGATIVE Final   Influenza B by PCR NEGATIVE NEGATIVE Final    Comment: (NOTE) The Xpert Xpress SARS-CoV-2/FLU/RSV assay is intended as an aid in  the diagnosis of influenza from Nasopharyngeal  swab specimens and  should not be used as a sole basis for treatment. Nasal washings and  aspirates are unacceptable for Xpert Xpress SARS-CoV-2/FLU/RSV  testing. Fact Sheet for Patients: https://www.moore.com/ Fact Sheet for Healthcare Providers: https://www.young.biz/ This test is not yet approved or cleared by the [place] FDA and  has been authorized for detection and/or diagnosis of SARS-CoV-2 by  FDA under an Emergency Use Authorization (EUA). This EUA will remain  in effect (meaning this test can be used) for the duration of the  Covid-19 declaration under Section 564(b)(1) of the Act, 21  U.S.C. section 360bbb-3(b)(1), unless the authorization is  terminated or revoked. Performed at [facility], [address], [place], [place] [address]          Radiology Studies: DG Chest 2 View  Result Date: 10/02/2019 CLINICAL DATA:  Hypoxia EXAM: CHEST - 2 VIEW COMPARISON:  04/05/2014 FINDINGS: Small bilateral pleural effusions. Enlarged cardiomediastinal silhouette with mild vascular congestion. No consolidation or pneumothorax. Aortic  atherosclerosis. IMPRESSION: Cardiomegaly with central vascular congestion and small bilateral pleural effusions. Electronically Signed   By: Donavan Foil M.D.   On: 10/02/2019 16:30        Scheduled Meds: . aspirin EC  81 mg Oral Daily  . enoxaparin (LOVENOX) injection  40 mg Subcutaneous Q24H  . hydrochlorothiazide  25 mg Oral Daily  . ipratropium-albuterol  3 mL Nebulization Q6H  . lisinopril  20 mg Oral Daily  . methylPREDNISolone (SOLU-MEDROL) injection  60 mg Intravenous Q6H   Followed by  . [START ON 10/04/2019] predniSONE  40 mg Oral Q breakfast  . pravastatin  20 mg Oral q1800   Continuous Infusions: . cefTRIAXone (ROCEPHIN)  IV Stopped (10/02/19 2302)     LOS: 0 days    Time spent: 30 mins    Wyvonnia Dusky, MD [facility] Pager [phone]  If 7PM-7AM,  please contact night-coverage [email or web address] 10/03/2019, 8:08 AM

## 2019-10-03 NOTE — Evaluation (Signed)
Occupational Therapy Evaluation Patient Details Name: Virginia Norman MRN: [id number] DOB: 11-25-1960 Today's Date: 10/03/2019    History of Present Illness Virginia Norman is a 55yoF who [facility] on [facility]19 c LEE, SOB, cough. Pt admitted with CHF exacerbation. PMH: COPD, HTN, CAD. 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   Clinical Impression   Patient presents to hospital with CHF exacerbation and B LE swelling/edema.  Previously, patient I with all (B/I)ADLs and works full time.  Patient now on 3L supplemental 02 with activity.  Patient demonstrates good ability to utilize energy conservation techniques and compensatory techniques during functional tasks.  Patient has good insight in to abilities/deficits and demonstrates good safety awareness and self pacing during functional tasks.  Patient is able to complete toileting and dressing at this time with MOD I.  Recommending SPV for bathing.  All education completed at time of evaluation and patient with no further questions or concerns.  Based on today's performance, recommending no OT follow up at this time.  Signing off.  Please reconsult if needs change.  Thank you.    Follow Up Recommendations  No OT follow up    Equipment Recommendations  None recommended by OT    Recommendations for Other Services       Precautions / Restrictions Precautions Precautions: None Precaution Comments: 3L supplemental 02 at this time Restrictions Weight Bearing Restrictions: No      Mobility Bed Mobility Overal bed mobility: Independent                Transfers Overall transfer level: Independent Equipment used: None             General transfer comment: sit<>stand and SPT without use of AD.    Balance Overall balance assessment: No apparent balance deficits (not formally assessed)                                          ADL either performed or assessed with clinical judgement   ADL Overall ADL's : Needs assistance/impaired                                       General ADL Comments: Patient reports she feels close to baseline.  However recommending SPV for bathing and long distance functional mobility at this time.     Vision Patient Visual Report: No change from baseline       Perception     Praxis      Pertinent Vitals/Pain Pain Assessment: No/denies pain     Hand Dominance Right   Extremity/Trunk Assessment Upper Extremity Assessment Upper Extremity Assessment: Overall WFL for tasks assessed   Lower Extremity Assessment Lower Extremity Assessment: Defer to PT evaluation   Cervical / Trunk Assessment Cervical / Trunk Assessment: Normal   Communication Communication Communication: No difficulties   Cognition Arousal/Alertness: Awake/alert Behavior During Therapy: WFL for tasks assessed/performed Overall Cognitive Status: Within Functional Limits for tasks assessed                                     General Comments  Demonstrates good safety awareness, self pacing and use of body mechanics.  Exercises Other Exercises Other Exercises: Provided education on goals and role of OT in acute care Other Exercises: Provided education on general safety (use of telephone, call light, bed controls, etc) Other Exercises: Provided general education on energy consrevation techniques to safely perform BADLs Other Exercises: Completed functional transfers with overall I and no AD   Shoulder Instructions      Home Living Family/patient expects to be discharged to:: Private residence Living Arrangements: Spouse/significant other Available Help at Discharge: Family Type of Home: Mobile home Home Access: Stairs to enter;Ramped entrance     Home Layout: One level     Bathroom Shower/Tub: Tub/shower unit         Home Equipment: None           Prior Functioning/Environment Level of Independence: Independent        Comments: Patient works full time and is I with (B/I)ADLs and is driving.  Does not report using any AD for functional mobility.        OT Problem List:        OT Treatment/Interventions:      OT Goals(Current goals can be found in the care plan section)    OT Frequency:     Barriers to D/C:            Co-evaluation              AM-PAC OT "6 Clicks" Daily Activity     Outcome Measure Help from another person eating meals?: None Help from another person taking care of personal grooming?: None Help from another person toileting, which includes using toliet, bedpan, or urinal?: None Help from another person bathing (including washing, rinsing, drying)?: A Little Help from another person to put on and taking off regular upper body clothing?: None Help from another person to put on and taking off regular lower body clothing?: None 6 Click Score: 23   End of Session Equipment Utilized During Treatment: Oxygen  Activity Tolerance: Patient tolerated treatment well Patient left: in bed;with call bell/phone within reach;with family/visitor present                   Time: 1448-1856 OT Time Calculation (min): 23 min Charges:  OT General Charges $OT Visit: 1 Visit OT Evaluation $OT Eval Low Complexity: 1 Low OT Treatments $Therapeutic Activity: 23-37 mins  Louanne Belton, MS, OTR/L 10/03/19, 2:10 PM

## 2019-10-03 NOTE — Progress Notes (Signed)
Nutrition Brief Note  Consult received for pt with PMH of COPD, HTN, smoker, and CAD who is now admitted for acute exacerbation of COPD.  Noted elevated hgb A1C with no hx of DM.  No changes in weight or appetite recently. Pt on IV steroids, expect elevated blood sugars.   Wt Readings from Last 15 Encounters:  10/03/19 92.9 kg  05/29/19 88.5 kg  05/24/18 88.5 kg  09/15/17 89.4 kg  01/29/16 89.8 kg    Body mass index is 32.08 kg/m. Patient meets criteria for obesity class I based on current BMI.   Current diet order is Heart Healthy, patient is consuming approximately 100% of meals at this time. Labs and medications reviewed.  Lab Results  Component Value Date   HGBA1C 6.3 (H) 10/03/2019    Recommend outpatient referral after discharge for education once over acute illness.    Cammy Copa., RD, LDN, CNSC See AMiON for contact information

## 2019-10-04 LAB — BASIC METABOLIC PANEL
Anion gap: 7 (ref 5–15)
BUN: 30 mg/dL — ABNORMAL HIGH (ref 6–20)
CO2: 40 mmol/L — ABNORMAL HIGH (ref 22–32)
Calcium: 8.4 mg/dL — ABNORMAL LOW (ref 8.9–10.3)
Chloride: 91 mmol/L — ABNORMAL LOW (ref 98–111)
Creatinine, Ser: 0.69 mg/dL (ref 0.44–1.00)
GFR calc Af Amer: >60 mL/min (ref >60)
GFR calc non Af Amer: >60 mL/min (ref >60)
Glucose, Bld: 130 mg/dL — ABNORMAL HIGH (ref 70–99)
Potassium: 4.8 mmol/L (ref 3.5–5.1)
Sodium: 138 mmol/L (ref 135–145)

## 2019-10-04 LAB — CBC
HCT: 50.9 % — ABNORMAL HIGH (ref 36.0–46.0)
Hemoglobin: 15.9 g/dL — ABNORMAL HIGH (ref 12.0–15.0)
MCH: 29.1 pg (ref 26.0–34.0)
MCHC: 31.2 g/dL (ref 30.0–36.0)
MCV: 93.2 fL (ref 80.0–100.0)
Platelets: 231 10*3/uL (ref 150–400)
RBC: 5.46 MIL/uL — ABNORMAL HIGH (ref 3.87–5.11)
RDW: 14.5 % (ref 11.5–15.5)
WBC: 14.6 10*3/uL — ABNORMAL HIGH (ref 4.0–10.5)
nRBC: 0 % (ref 0.0–0.2)

## 2019-10-04 LAB — ECHOCARDIOGRAM COMPLETE
Height: 67 in
Weight: 3276.92 oz

## 2019-10-04 LAB — PROCALCITONIN: Procalcitonin: <0.1 ng/mL

## 2019-10-04 MED ORDER — IPRATROPIUM-ALBUTEROL 0.5-2.5 (3) MG/3ML IN SOLN
3.0000 mL | Freq: Three times a day (TID) | RESPIRATORY_TRACT | Status: DC
  Administered 2019-10-04 – 2019-10-09 (×15): 3 mL via RESPIRATORY_TRACT
  Filled 2019-10-04 (×15): qty 3

## 2019-10-04 MED ORDER — TIOTROPIUM BROMIDE MONOHYDRATE 18 MCG IN CAPS
18.0000 ug | ORAL_CAPSULE | Freq: Every day | RESPIRATORY_TRACT | Status: DC
  Administered 2019-10-04 – 2019-10-06 (×3): 18 ug via RESPIRATORY_TRACT
  Filled 2019-10-04: qty 5

## 2019-10-04 NOTE — Progress Notes (Signed)
PROGRESS NOTE    Virginia Norman  WUJ:[id number] DOB: [birth year] DOA: 10/02/2019 PCP: Martie RoundSpencer, Nicole, NP      Assessment & Plan:   Principal Problem:   COPD exacerbation (HCC) Active Problems:   Benign essential HTN   CAD (coronary artery disease)   COPD exacerbation: continue on IV steroids,  azithromycin, & bronchodilators. Encourage incentive spirometry. Continue on supplemental oxygen and wean as tolerated. Does not use supplemental oxygen at home.  Acute hypoxic respiratory failure: secondary to above. Continue on supplemental oxygen and wean as tolerated. Does not use supplemental oxygen at home. Management as stated above   Leukocytosis: likely secondary to steroid use. Will continue to monitor   Tobacco abuse: not smoked in 3 days & wants to quit smoking for good.  Offered nicotine patch but pt declined.    Hypertension: continue on HCTZ, lisinopril  CAD: stable.  No evidence of decompensation.   Pre-DM: HbA1c 6.3. Diet and exercise counseling. Dietitian consulted   Elevated troponins: likely secondary to demand ischemia. No chest pain. Trending down   DVT prophylaxis: lovenox Code Status: full  Family Communication: Disposition Plan: will likely d/c back home when medically stable   Consultants:      Procedures:   Antimicrobials: azithromycin    Subjective: Pt c/o shortness of breath still   Objective: Vitals:   10/03/19 2009 10/04/19 0453 10/04/19 0709 10/04/19 0817  BP: 111/72 130/77  111/70  Pulse: 87 73  68  Resp:  15  17  Temp:  98 F (36.7 C)  98.1 F (36.7 C)  TempSrc:  Oral  Oral  SpO2: 93% 91% 90% 94%  Weight:  93.6 kg    Height:        Intake/Output Summary (Last 24 hours) at 10/04/2019 0819 Last data filed at 10/03/2019 2135 Gross per 24 hour  Intake 480 ml  Output 600 ml  Net -120 ml   Filed Weights   10/02/19 2108 10/03/19 0505 10/04/19 0453  Weight: 92.9 kg 92.9 kg 93.6 kg    Examination:  General exam:  Appears calm and comfortable  Respiratory system: diminished breath sounds b/l. Scattered wheezes Cardiovascular system: S1 & S2 +. No rubs, gallops or clicks. B/l LE edema Gastrointestinal system: Abdomen is obese, soft and nontender.  Normal bowel sounds heard. Central nervous system: Alert and oriented. Moves all 4 extremities  Psychiatry: Judgement and insight appear normal. Flat mood and affect.     Data Reviewed: I have personally reviewed following labs and imaging studies  CBC: Recent Labs  Lab 10/02/19 1551 10/03/19 0013 10/04/19 0446  WBC 8.9 9.5 14.6*  HGB 16.1* 16.5* 15.9*  HCT 52.2* 53.3* 50.9*  MCV 94.7 94.7 93.2  PLT 212 220 231   Basic Metabolic Panel: Recent Labs  Lab 10/02/19 1551 10/02/19 2151 10/03/19 0013 10/03/19 0445 10/04/19 0446  NA 136  --  138 139 138  K 4.1  --  4.0 4.1 4.8  CL 92*  --  90* 90* 91*  CO2 36*  --  36* 39* 40*  GLUCOSE 119*  --  201* 129* 130*  BUN 17  --  15 15 30*  CREATININE 0.44  --  0.64 0.60 0.69  CALCIUM 8.1*  --  8.4* 8.4* 8.4*  MG  --  2.4  --   --   --    GFR: Estimated Creatinine Clearance: 87.8 mL/min (by C-G formula based on SCr of 0.69 mg/dL). Liver Function Tests: Recent Labs  Lab 10/03/19 0013  AST 28  ALT 27  ALKPHOS 95  BILITOT 0.5  PROT 6.5  ALBUMIN 3.5   No results for input(s): LIPASE, AMYLASE in the last 168 hours. No results for input(s): AMMONIA in the last 168 hours. Coagulation Profile: No results for input(s): INR, PROTIME in the last 168 hours. Cardiac Enzymes: No results for input(s): CKTOTAL, CKMB, CKMBINDEX, TROPONINI in the last 168 hours. BNP (last 3 results) No results for input(s): PROBNP in the last 8760 hours. HbA1C: Recent Labs    10/03/19 0445  HGBA1C 6.3*   CBG: No results for input(s): GLUCAP in the last 168 hours. Lipid Profile: No results for input(s): CHOL, HDL, LDLCALC, TRIG, CHOLHDL, LDLDIRECT in the last 72 hours. Thyroid Function Tests: No results for  input(s): TSH, T4TOTAL, FREET4, T3FREE, THYROIDAB in the last 72 hours. Anemia Panel: No results for input(s): VITAMINB12, FOLATE, FERRITIN, TIBC, IRON, RETICCTPCT in the last 72 hours. Sepsis Labs: Recent Labs  Lab 10/03/19 0445 10/04/19 0446  PROCALCITON <0.10 <0.10    Recent Results (from the past 240 hour(s))  Respiratory Panel by RT PCR (Flu A&B, Covid) - Nasopharyngeal Swab     Status: None   Collection Time: 10/02/19  4:53 PM   Specimen: Nasopharyngeal Swab  Result Value Ref Range Status   SARS Coronavirus 2 by RT PCR NEGATIVE NEGATIVE Final    Comment: (NOTE) SARS-CoV-2 target nucleic acids are NOT DETECTED. The SARS-CoV-2 RNA is generally detectable in upper respiratoy specimens during the acute phase of infection. The lowest concentration of SARS-CoV-2 viral copies this assay can detect is 131 copies/mL. A negative result does not preclude SARS-Cov-2 infection and should not be used as the sole basis for treatment or other patient management decisions. A negative result may occur with  improper specimen collection/handling, submission of specimen other than nasopharyngeal swab, presence of viral mutation(s) within the areas targeted by this assay, and inadequate number of viral copies (<131 copies/mL). A negative result must be combined with clinical observations, patient history, and epidemiological information. The expected result is Negative. Fact Sheet for Patients:  https://www.moore.com/ Fact Sheet for Healthcare Providers:  https://www.young.biz/ This test is not yet ap proved or cleared by the [place] FDA and  has been authorized for detection and/or diagnosis of SARS-CoV-2 by FDA under an Emergency Use Authorization (EUA). This EUA will remain  in effect (meaning this test can be used) for the duration of the COVID-19 declaration under Section 564(b)(1) of the Act, 21 U.S.C. section 360bbb-3(b)(1), unless the  authorization is terminated or revoked sooner.    Influenza A by PCR NEGATIVE NEGATIVE Final   Influenza B by PCR NEGATIVE NEGATIVE Final    Comment: (NOTE) The Xpert Xpress SARS-CoV-2/FLU/RSV assay is intended as an aid in  the diagnosis of influenza from Nasopharyngeal swab specimens and  should not be used as a sole basis for treatment. Nasal washings and  aspirates are unacceptable for Xpert Xpress SARS-CoV-2/FLU/RSV  testing. Fact Sheet for Patients: https://www.moore.com/ Fact Sheet for Healthcare Providers: https://www.young.biz/ This test is not yet approved or cleared by the [place] FDA and  has been authorized for detection and/or diagnosis of SARS-CoV-2 by  FDA under an Emergency Use Authorization (EUA). This EUA will remain  in effect (meaning this test can be used) for the duration of the  Covid-19 declaration under Section 564(b)(1) of the Act, 21  U.S.C. section 360bbb-3(b)(1), unless the authorization is  terminated or revoked. Performed at [facility], [address] [place], [place]  [address]          Radiology Studies: DG Chest 2 View  Result Date: 10/02/2019 CLINICAL DATA:  Hypoxia EXAM: CHEST - 2 VIEW COMPARISON:  04/05/2014 FINDINGS: Small bilateral pleural effusions. Enlarged cardiomediastinal silhouette with mild vascular congestion. No consolidation or pneumothorax. Aortic atherosclerosis. IMPRESSION: Cardiomegaly with central vascular congestion and small bilateral pleural effusions. Electronically Signed   By: Jasmine Pang M.D.   On: 10/02/2019 16:30   ECHOCARDIOGRAM COMPLETE  Result Date: 10/04/2019    ECHOCARDIOGRAM REPORT   Patient Name:   Lameisha Mcmichael Date of Exam: 10/03/2019 Medical Rec #:  [id number]      Height:       67.0 in Accession #:    [id number]     Weight:       204.8 lb Date of Birth:  07-23-1960     BSA:          2.043 m Patient Age:    58 years       BP:           136/82 mmHg  Patient Gender: F              HR:           80 bpm. Exam Location:  [facility] Procedure: 2D Echo, Color Doppler, Cardiac Doppler and Intracardiac            Opacification Agent Indications:     I25.10 CAD Native Vessel  History:         Patient has no prior history of Echocardiogram examinations.                  CAD, COPD, Signs/Symptoms:Edema; Risk Factors:Hypertension and                  Current Smoker.  Sonographer:     Humphrey Rolls RDCS (AE) Referring Phys:  [id number] BRENDA MORRISON Diagnosing Phys: Alwyn Pea MD  Sonographer Comments: Technically difficult study due to poor echo windows. Image acquisition challenging due to COPD. IMPRESSIONS  1. Left ventricular ejection fraction, by estimation, is 60 to 65%. The left ventricle has normal function. The left ventricle has no regional wall motion abnormalities. Left ventricular diastolic parameters are consistent with Grade I diastolic dysfunction (impaired relaxation).  2. Right ventricular systolic function is normal. The right ventricular size is normal.  3. The mitral valve is normal in structure. Mild mitral valve regurgitation.  4. The aortic valve is normal in structure. Aortic valve regurgitation is not visualized. FINDINGS  Left Ventricle: Left ventricular ejection fraction, by estimation, is 60 to 65%. The left ventricle has normal function. The left ventricle has no regional wall motion abnormalities. Definity contrast agent was given IV to delineate the left ventricular  endocardial borders. The left ventricular internal cavity size was normal in size. There is no left ventricular hypertrophy. Left ventricular diastolic parameters are consistent with Grade I diastolic dysfunction (impaired relaxation). Right Ventricle: The right ventricular size is normal. No increase in right ventricular wall thickness. Right ventricular systolic function is normal. Left Atrium: Left atrial size was normal in size. Right Atrium: Right atrial size was normal in  size. Pericardium: There is no evidence of pericardial effusion. Mitral Valve: The mitral valve is normal in structure. Mild mitral valve regurgitation. MV peak gradient, 6.1 mmHg. The mean mitral valve gradient is 3.0 mmHg. Tricuspid Valve: The tricuspid valve is normal in structure. Tricuspid valve regurgitation is trivial. Aortic Valve: The aortic valve is normal in  structure. Aortic valve regurgitation is not visualized. Aortic valve mean gradient measures 12.5 mmHg. Aortic valve peak gradient measures 22.1 mmHg. Aortic valve area, by VTI measures 3.40 cm. Pulmonic Valve: The pulmonic valve was normal in structure. Pulmonic valve regurgitation is not visualized. Aorta: The aortic root is normal in size and structure. IAS/Shunts: No atrial level shunt detected by color flow Doppler.  LEFT VENTRICLE PLAX 2D LVIDd:         4.37 cm  Diastology LVIDs:         2.87 cm  LV e' lateral:   6.64 cm/s LV PW:         1.08 cm  LV E/e' lateral: 14.5 LV IVS:        1.09 cm  LV e' medial:    4.24 cm/s LVOT diam:     2.20 cm  LV E/e' medial:  22.6 LV SV:         166 LV SV Index:   82 LVOT Area:     3.80 cm  LEFT ATRIUM            Index LA diam:      5.70 cm  2.79 cm/m LA Vol (A4C): 117.0 ml 57.28 ml/m  AORTIC VALVE                    PULMONIC VALVE AV Area (Vmax):    3.04 cm     PV Vmax:       1.19 m/s AV Area (Vmean):   3.07 cm     PV Vmean:      69.200 cm/s AV Area (VTI):     3.40 cm     PV VTI:        0.204 m AV Vmax:           235.00 cm/s  PV Peak grad:  5.7 mmHg AV Vmean:          168.500 cm/s PV Mean grad:  2.0 mmHg AV VTI:            0.490 m AV Peak Grad:      22.1 mmHg AV Mean Grad:      12.5 mmHg LVOT Vmax:         188.00 cm/s LVOT Vmean:        136.000 cm/s LVOT VTI:          0.438 m LVOT/AV VTI ratio: 0.89  AORTA Ao Root diam: 2.90 cm MITRAL VALVE MV Area (PHT): 3.12 cm     SHUNTS MV Peak grad:  6.1 mmHg     Systemic VTI:  0.44 m MV Mean grad:  3.0 mmHg     Systemic Diam: 2.20 cm MV Vmax:       1.23 m/s MV  Vmean:      81.6 cm/s MV Decel Time: 243 msec MV E velocity: 96.00 cm/s MV A velocity: 116.00 cm/s MV E/A ratio:  0.83 Dwayne D Callwood MD Electronically signed by Alwyn Pea MD Signature Date/Time: 10/04/2019/8:08:23 AM    Final         Scheduled Meds: . aspirin EC  81 mg Oral Daily  . azithromycin  500 mg Oral Daily  . enoxaparin (LOVENOX) injection  40 mg Subcutaneous Q24H  . hydrochlorothiazide  25 mg Oral Daily  . ipratropium-albuterol  3 mL Nebulization Q6H  . lisinopril  20 mg Oral Daily  . methylPREDNISolone (SOLU-MEDROL) injection  40 mg Intravenous Q6H  . pravastatin  20 mg Oral  q1800   Continuous Infusions:    LOS: 1 day    Time spent: 30 mins    Wyvonnia Dusky, MD [facility] Pager [phone]  If 7PM-7AM, please contact night-coverage [email or web address] 10/04/2019, 8:19 AM

## 2019-10-04 NOTE — TOC Progression Note (Signed)
Transition of Care [facility]) - Progression Note    Patient Details  Name: Virginia Norman MRN: [id number] Date of Birth: [birth year]  Transition of [facility]) CM/SW Contact  Shawn Route, RN Phone Number: 10/04/2019, 9:08 AM  Clinical Narrative:     Monitoring for oxygen need.  Patient is placed on oxygen to maintain oxygen level.  Team in unable to wean oxygen off.  Decreased to 4LNC over night.  Patient does have a PCP listed in chart.         Expected Discharge Plan and Services                                                 Social Determinants of Health (SDOH) Interventions    Readmission Risk Interventions No flowsheet data found.

## 2019-10-05 LAB — CBC
HCT: 53.9 % — ABNORMAL HIGH (ref 36.0–46.0)
Hemoglobin: 16.4 g/dL — ABNORMAL HIGH (ref 12.0–15.0)
MCH: 28.7 pg (ref 26.0–34.0)
MCHC: 30.4 g/dL (ref 30.0–36.0)
MCV: 94.4 fL (ref 80.0–100.0)
Platelets: 247 10*3/uL (ref 150–400)
RBC: 5.71 MIL/uL — ABNORMAL HIGH (ref 3.87–5.11)
RDW: 14.5 % (ref 11.5–15.5)
WBC: 12.2 10*3/uL — ABNORMAL HIGH (ref 4.0–10.5)
nRBC: 0 % (ref 0.0–0.2)

## 2019-10-05 LAB — BASIC METABOLIC PANEL
Anion gap: 9 (ref 5–15)
BUN: 23 mg/dL — ABNORMAL HIGH (ref 6–20)
CO2: 41 mmol/L — ABNORMAL HIGH (ref 22–32)
Calcium: 8.6 mg/dL — ABNORMAL LOW (ref 8.9–10.3)
Chloride: 88 mmol/L — ABNORMAL LOW (ref 98–111)
Creatinine, Ser: 0.62 mg/dL (ref 0.44–1.00)
GFR calc Af Amer: >60 mL/min (ref >60)
GFR calc non Af Amer: >60 mL/min (ref >60)
Glucose, Bld: 117 mg/dL — ABNORMAL HIGH (ref 70–99)
Potassium: 4.8 mmol/L (ref 3.5–5.1)
Sodium: 138 mmol/L (ref 135–145)

## 2019-10-05 LAB — GLUCOSE, CAPILLARY: Glucose-Capillary: 134 mg/dL — ABNORMAL HIGH (ref 70–99)

## 2019-10-05 MED ORDER — SODIUM CHLORIDE 0.9% FLUSH
3.0000 mL | Freq: Two times a day (BID) | INTRAVENOUS | Status: DC
  Administered 2019-10-05 – 2019-10-09 (×11): 3 mL via INTRAVENOUS

## 2019-10-05 NOTE — Plan of Care (Signed)
Nutrition Education Note   RD consulted for nutrition education regarding diabetes.   59 y/o F who comes to [facility] on 10/02/19 c/o LEE, SOB, cough. Pt admitted with CHF exacerbation. PMH: COPD, HTN, CAD.  Lab Results  Component Value Date   HGBA1C 6.3 (H) 10/03/2019    RD provided "Nutrition and Type II Diabetes" handout from the Academy of Nutrition and Dietetics. Discussed different food groups and their effects on blood sugar, emphasizing carbohydrate-containing foods. Provided list of carbohydrates and recommended serving sizes of common foods.  Discussed importance of controlled and consistent carbohydrate intake throughout the day. Provided examples of ways to balance meals/snacks and encouraged intake of high-fiber, whole grain complex carbohydrates.   RD also provided low sodium diet education. Provided examples on ways to decrease sodium intake in diet. Discouraged intake of processed foods and use of salt shaker. Encouraged fresh fruits and vegetables as well as whole grain sources of carbohydrates to maximize fiber intake.   RD discussed why it is important for patient to adhere to diet recommendations, and emphasized the role of fluids, foods to avoid, and importance of weighing self daily.   Teach back method used.  Expect good compliance.  Body mass index is 32.15 kg/m. Pt meets criteria for obesity based on current BMI.  Current diet order is HH, patient is consuming approximately 100% of meals at this time. Labs and medications reviewed. No further nutrition interventions warranted at this time. RD contact information provided. If additional nutrition issues arise, please re-consult RD.   Wenda Low MS, RD, LDN Please refer to [facility] for RD and/or RD on-call/weekend/after hours pager

## 2019-10-05 NOTE — Progress Notes (Signed)
PROGRESS NOTE    Virginia Norman  KGM:[id number] DOB: 21-Dec-1960 DOA: 10/02/2019 PCP: Martie Round, NP      Assessment & Plan:   Principal Problem:   COPD exacerbation (HCC) Active Problems:   Benign essential HTN   CAD (coronary artery disease)   COPD exacerbation: continue on IV steroids,  azithromycin x 5 days, & bronchodilators. Consider steroid taper if pt's oxygen demand decreases. Encourage incentive spirometry. Continue on supplemental oxygen and wean as tolerated. Does not use supplemental oxygen at home.  Acute hypoxic respiratory failure: secondary to above. Continue on supplemental oxygen and wean as tolerated. Does not use supplemental oxygen at home. Pt also has no insurance to possibly pay for oxygen if needed at d/c. Management as stated above   Leukocytosis: likely secondary to steroid use. Will continue to monitor   Tobacco abuse: not smoked in 3 days & wants to quit smoking for good.  Offered nicotine patch but pt declined.    Hypertension: continue on HCTZ, lisinopril  CAD: stable.  No evidence of decompensation.   Pre-DM: HbA1c 6.3. Diet and exercise counseling. Dietitian consulted   Elevated troponins: likely secondary to demand ischemia. No chest pain. Trending down   DVT prophylaxis: lovenox Code Status: full  Family Communication: Disposition Plan: will likely d/c back home when medically stable   Consultants:      Procedures:   Antimicrobials: azithromycin    Subjective: Pt c/o shortness of breath.  Objective: Vitals:   10/05/19 0058 10/05/19 0419 10/05/19 0722 10/05/19 0809  BP:  130/78 132/78   Pulse:  71 72   Resp: 20  16   Temp:  98.1 F (36.7 C) 97.8 F (36.6 C)   TempSrc:  Oral Oral   SpO2:  98% 99% 97%  Weight:  93.1 kg    Height:        Intake/Output Summary (Last 24 hours) at 10/05/2019 0838 Last data filed at 10/05/2019 0421 Gross per 24 hour  Intake 720 ml  Output 2100 ml  Net -1380 ml   Filed Weights     10/03/19 0505 10/04/19 0453 10/05/19 0419  Weight: 92.9 kg 93.6 kg 93.1 kg    Examination:  General exam: Appears calm and comfortable  Respiratory system: decreased breath sounds b/l. Intermittent wheezes, no rales Cardiovascular system: S1 & S2 +. No rubs, gallops or clicks. B/l LE edema Gastrointestinal system: Abdomen is obese, soft and nontender.  Hypoactive bowel sounds heard. Central nervous system: Alert and oriented. Moves all 4 extremities  Psychiatry: Judgement and insight appear normal. Flat mood and affect.     Data Reviewed: I have personally reviewed following labs and imaging studies  CBC: Recent Labs  Lab 10/02/19 1551 10/03/19 0013 10/04/19 0446 10/05/19 0514  WBC 8.9 9.5 14.6* 12.2*  HGB 16.1* 16.5* 15.9* 16.4*  HCT 52.2* 53.3* 50.9* 53.9*  MCV 94.7 94.7 93.2 94.4  PLT 212 220 231 247   Basic Metabolic Panel: Recent Labs  Lab 10/02/19 1551 10/02/19 2151 10/03/19 0013 10/03/19 0445 10/04/19 0446 10/05/19 0514  NA 136  --  138 139 138 138  K 4.1  --  4.0 4.1 4.8 4.8  CL 92*  --  90* 90* 91* 88*  CO2 36*  --  36* 39* 40* 41*  GLUCOSE 119*  --  201* 129* 130* 117*  BUN 17  --  15 15 30* 23*  CREATININE 0.44  --  0.64 0.60 0.69 0.62  CALCIUM 8.1*  --  8.4* 8.4* 8.4*  8.6*  MG  --  2.4  --   --   --   --    GFR: Estimated Creatinine Clearance: 87.6 mL/min (by C-G formula based on SCr of 0.62 mg/dL). Liver Function Tests: Recent Labs  Lab 10/03/19 0013  AST 28  ALT 27  ALKPHOS 95  BILITOT 0.5  PROT 6.5  ALBUMIN 3.5   No results for input(s): LIPASE, AMYLASE in the last 168 hours. No results for input(s): AMMONIA in the last 168 hours. Coagulation Profile: No results for input(s): INR, PROTIME in the last 168 hours. Cardiac Enzymes: No results for input(s): CKTOTAL, CKMB, CKMBINDEX, TROPONINI in the last 168 hours. BNP (last 3 results) No results for input(s): PROBNP in the last 8760 hours. HbA1C: Recent Labs    10/03/19 0445   HGBA1C 6.3*   CBG: No results for input(s): GLUCAP in the last 168 hours. Lipid Profile: No results for input(s): CHOL, HDL, LDLCALC, TRIG, CHOLHDL, LDLDIRECT in the last 72 hours. Thyroid Function Tests: No results for input(s): TSH, T4TOTAL, FREET4, T3FREE, THYROIDAB in the last 72 hours. Anemia Panel: No results for input(s): VITAMINB12, FOLATE, FERRITIN, TIBC, IRON, RETICCTPCT in the last 72 hours. Sepsis Labs: Recent Labs  Lab 10/03/19 0445 10/04/19 0446  PROCALCITON <0.10 <0.10    Recent Results (from the past 240 hour(s))  Respiratory Panel by RT PCR (Flu A&B, Covid) - Nasopharyngeal Swab     Status: None   Collection Time: 10/02/19  4:53 PM   Specimen: Nasopharyngeal Swab  Result Value Ref Range Status   SARS Coronavirus 2 by RT PCR NEGATIVE NEGATIVE Final    Comment: (NOTE) SARS-CoV-2 target nucleic acids are NOT DETECTED. The SARS-CoV-2 RNA is generally detectable in upper respiratoy specimens during the acute phase of infection. The lowest concentration of SARS-CoV-2 viral copies this assay can detect is 131 copies/mL. A negative result does not preclude SARS-Cov-2 infection and should not be used as the sole basis for treatment or other patient management decisions. A negative result may occur with  improper specimen collection/handling, submission of specimen other than nasopharyngeal swab, presence of viral mutation(s) within the areas targeted by this assay, and inadequate number of viral copies (<131 copies/mL). A negative result must be combined with clinical observations, patient history, and epidemiological information. The expected result is Negative. Fact Sheet for Patients:  https://www.moore.com/ Fact Sheet for Healthcare Providers:  https://www.young.biz/ This test is not yet ap proved or cleared by the [place] FDA and  has been authorized for detection and/or diagnosis of SARS-CoV-2 by FDA under an  Emergency Use Authorization (EUA). This EUA will remain  in effect (meaning this test can be used) for the duration of the COVID-19 declaration under Section 564(b)(1) of the Act, 21 U.S.C. section 360bbb-3(b)(1), unless the authorization is terminated or revoked sooner.    Influenza A by PCR NEGATIVE NEGATIVE Final   Influenza B by PCR NEGATIVE NEGATIVE Final    Comment: (NOTE) The Xpert Xpress SARS-CoV-2/FLU/RSV assay is intended as an aid in  the diagnosis of influenza from Nasopharyngeal swab specimens and  should not be used as a sole basis for treatment. Nasal washings and  aspirates are unacceptable for Xpert Xpress SARS-CoV-2/FLU/RSV  testing. Fact Sheet for Patients: https://www.moore.com/ Fact Sheet for Healthcare Providers: https://www.young.biz/ This test is not yet approved or cleared by the [place] FDA and  has been authorized for detection and/or diagnosis of SARS-CoV-2 by  FDA under an Emergency Use Authorization (EUA). This EUA will remain  in effect (meaning this test can be used) for the duration of the  Covid-19 declaration under Section 564(b)(1) of the Act, 21  U.S.C. section 360bbb-3(b)(1), unless the authorization is  terminated or revoked. Performed at [facility], [address] [place], [place] [address]          Radiology Studies: ECHOCARDIOGRAM COMPLETE  Result Date: 10/04/2019    ECHOCARDIOGRAM REPORT   Patient Name:   SYDNY SCHNITZLER Date of Exam: 10/03/2019 Medical Rec #:  [id number]      Height:       67.0 in Accession #:    [id number]     Weight:       204.8 lb Date of Birth:  August 01, 1960     BSA:          2.043 m Patient Age:    58 years       BP:           136/82 mmHg Patient Gender: F              HR:           80 bpm. Exam Location:  [facility] Procedure: 2D Echo, Color Doppler, Cardiac Doppler and Intracardiac            Opacification Agent Indications:     I25.10 CAD Native Vessel  History:          Patient has no prior history of Echocardiogram examinations.                  CAD, COPD, Signs/Symptoms:Edema; Risk Factors:Hypertension and                  Current Smoker.  Sonographer:     Humphrey Rolls RDCS (AE) Referring Phys:  [id number] BRENDA MORRISON Diagnosing Phys: Alwyn Pea MD  Sonographer Comments: Technically difficult study due to poor echo windows. Image acquisition challenging due to COPD. IMPRESSIONS  1. Left ventricular ejection fraction, by estimation, is 60 to 65%. The left ventricle has normal function. The left ventricle has no regional wall motion abnormalities. Left ventricular diastolic parameters are consistent with Grade I diastolic dysfunction (impaired relaxation).  2. Right ventricular systolic function is normal. The right ventricular size is normal.  3. The mitral valve is normal in structure. Mild mitral valve regurgitation.  4. The aortic valve is normal in structure. Aortic valve regurgitation is not visualized. FINDINGS  Left Ventricle: Left ventricular ejection fraction, by estimation, is 60 to 65%. The left ventricle has normal function. The left ventricle has no regional wall motion abnormalities. Definity contrast agent was given IV to delineate the left ventricular  endocardial borders. The left ventricular internal cavity size was normal in size. There is no left ventricular hypertrophy. Left ventricular diastolic parameters are consistent with Grade I diastolic dysfunction (impaired relaxation). Right Ventricle: The right ventricular size is normal. No increase in right ventricular wall thickness. Right ventricular systolic function is normal. Left Atrium: Left atrial size was normal in size. Right Atrium: Right atrial size was normal in size. Pericardium: There is no evidence of pericardial effusion. Mitral Valve: The mitral valve is normal in structure. Mild mitral valve regurgitation. MV peak gradient, 6.1 mmHg. The mean mitral valve gradient is 3.0 mmHg.  Tricuspid Valve: The tricuspid valve is normal in structure. Tricuspid valve regurgitation is trivial. Aortic Valve: The aortic valve is normal in structure. Aortic valve regurgitation is not visualized. Aortic valve mean gradient measures 12.5 mmHg. Aortic valve peak gradient measures  22.1 mmHg. Aortic valve area, by VTI measures 3.40 cm. Pulmonic Valve: The pulmonic valve was normal in structure. Pulmonic valve regurgitation is not visualized. Aorta: The aortic root is normal in size and structure. IAS/Shunts: No atrial level shunt detected by color flow Doppler.  LEFT VENTRICLE PLAX 2D LVIDd:         4.37 cm  Diastology LVIDs:         2.87 cm  LV e' lateral:   6.64 cm/s LV PW:         1.08 cm  LV E/e' lateral: 14.5 LV IVS:        1.09 cm  LV e' medial:    4.24 cm/s LVOT diam:     2.20 cm  LV E/e' medial:  22.6 LV SV:         166 LV SV Index:   82 LVOT Area:     3.80 cm  LEFT ATRIUM            Index LA diam:      5.70 cm  2.79 cm/m LA Vol (A4C): 117.0 ml 57.28 ml/m  AORTIC VALVE                    PULMONIC VALVE AV Area (Vmax):    3.04 cm     PV Vmax:       1.19 m/s AV Area (Vmean):   3.07 cm     PV Vmean:      69.200 cm/s AV Area (VTI):     3.40 cm     PV VTI:        0.204 m AV Vmax:           235.00 cm/s  PV Peak grad:  5.7 mmHg AV Vmean:          168.500 cm/s PV Mean grad:  2.0 mmHg AV VTI:            0.490 m AV Peak Grad:      22.1 mmHg AV Mean Grad:      12.5 mmHg LVOT Vmax:         188.00 cm/s LVOT Vmean:        136.000 cm/s LVOT VTI:          0.438 m LVOT/AV VTI ratio: 0.89  AORTA Ao Root diam: 2.90 cm MITRAL VALVE MV Area (PHT): 3.12 cm     SHUNTS MV Peak grad:  6.1 mmHg     Systemic VTI:  0.44 m MV Mean grad:  3.0 mmHg     Systemic Diam: 2.20 cm MV Vmax:       1.23 m/s MV Vmean:      81.6 cm/s MV Decel Time: 243 msec MV E velocity: 96.00 cm/s MV A velocity: 116.00 cm/s MV E/A ratio:  0.83 Dwayne D Callwood MD Electronically signed by Yolonda Kida MD Signature Date/Time: 10/04/2019/8:08:23 AM     Final         Scheduled Meds: . aspirin EC  81 mg Oral Daily  . azithromycin  500 mg Oral Daily  . enoxaparin (LOVENOX) injection  40 mg Subcutaneous Q24H  . hydrochlorothiazide  25 mg Oral Daily  . ipratropium-albuterol  3 mL Nebulization TID  . lisinopril  20 mg Oral Daily  . methylPREDNISolone (SOLU-MEDROL) injection  40 mg Intravenous Q6H  . pravastatin  20 mg Oral q1800  . tiotropium  18 mcg Inhalation Daily   Continuous Infusions:    LOS: 2 days  Time spent: 30 mins    Charise KillianJamiese M Albert Devaul, MD [facility] Pager [phone]  If 7PM-7AM, please contact night-coverage [email or web address] 10/05/2019, 8:38 AM

## 2019-10-06 LAB — BASIC METABOLIC PANEL
Anion gap: 10 (ref 5–15)
BUN: 21 mg/dL — ABNORMAL HIGH (ref 6–20)
CO2: 43 mmol/L — ABNORMAL HIGH (ref 22–32)
Calcium: 8.5 mg/dL — ABNORMAL LOW (ref 8.9–10.3)
Chloride: 88 mmol/L — ABNORMAL LOW (ref 98–111)
Creatinine, Ser: 0.54 mg/dL (ref 0.44–1.00)
GFR calc Af Amer: >60 mL/min (ref >60)
GFR calc non Af Amer: >60 mL/min (ref >60)
Glucose, Bld: 125 mg/dL — ABNORMAL HIGH (ref 70–99)
Potassium: 5.1 mmol/L (ref 3.5–5.1)
Sodium: 141 mmol/L (ref 135–145)

## 2019-10-06 LAB — CBC
HCT: 53.2 % — ABNORMAL HIGH (ref 36.0–46.0)
Hemoglobin: 16.1 g/dL — ABNORMAL HIGH (ref 12.0–15.0)
MCH: 28.9 pg (ref 26.0–34.0)
MCHC: 30.3 g/dL (ref 30.0–36.0)
MCV: 95.5 fL (ref 80.0–100.0)
Platelets: 226 10*3/uL (ref 150–400)
RBC: 5.57 MIL/uL — ABNORMAL HIGH (ref 3.87–5.11)
RDW: 14.4 % (ref 11.5–15.5)
WBC: 8.8 10*3/uL (ref 4.0–10.5)
nRBC: 0 % (ref 0.0–0.2)

## 2019-10-06 LAB — GLUCOSE, CAPILLARY: Glucose-Capillary: 105 mg/dL — ABNORMAL HIGH (ref 70–99)

## 2019-10-06 MED ORDER — METHYLPREDNISOLONE SODIUM SUCC 40 MG IJ SOLR
40.0000 mg | Freq: Two times a day (BID) | INTRAMUSCULAR | Status: DC
  Administered 2019-10-06 – 2019-10-08 (×4): 40 mg via INTRAVENOUS
  Filled 2019-10-06 (×4): qty 1

## 2019-10-06 MED ORDER — BUDESONIDE 0.25 MG/2ML IN SUSP
0.2500 mg | Freq: Two times a day (BID) | RESPIRATORY_TRACT | Status: DC
  Administered 2019-10-06 – 2019-10-09 (×6): 0.25 mg via RESPIRATORY_TRACT
  Filled 2019-10-06 (×7): qty 2

## 2019-10-06 MED ORDER — ARFORMOTEROL TARTRATE 15 MCG/2ML IN NEBU
15.0000 ug | INHALATION_SOLUTION | Freq: Two times a day (BID) | RESPIRATORY_TRACT | Status: DC
  Administered 2019-10-06 – 2019-10-09 (×6): 15 ug via RESPIRATORY_TRACT
  Filled 2019-10-06 (×8): qty 2

## 2019-10-06 NOTE — Progress Notes (Signed)
SATURATION QUALIFICATIONS: (This note is used to comply with regulatory documentation for home oxygen)  Patient Saturations on Room Air at Rest = 86%  Patient Saturations on Room Air while Ambulating = n/a%  Patient Saturations on 4 Liters of oxygen while Ambulating = 90%  Please briefly explain why patient needs home oxygen:  Currently desating on room air to 86%.

## 2019-10-06 NOTE — Plan of Care (Signed)
Oxygen saturation decreases to 80s with ambulation to bathroom.

## 2019-10-06 NOTE — Progress Notes (Signed)
PROGRESS NOTE    Virginia Norman  OXB:[id number] DOB: Jan 20, 1961 DOA: 10/02/2019 PCP: Elisabeth Cara, NP      Assessment & Plan:   Principal Problem:   COPD exacerbation ([place]) Active Problems:   Benign essential HTN   CAD (coronary artery disease)   COPD exacerbation:  Improving over interval Air movement still poor but wheezing improved Plan: Decrease IV steroids, 40 mg every 12, plan to continue taper aggressively if oxygen status improved Stress incentive spirometry use DuoNeb scheduled Add schedule Brovana Add scheduled Pulmicort Ambulate, continue to taper oxygen as tolerated   Acute hypoxic respiratory failure:  secondary to above.  Continue on supplemental oxygen and wean as tolerated.  Does not use supplemental oxygen at home.  Pt also has no insurance to possibly pay for oxygen if needed at d/c.  Management as stated above  Home oxygen ordered however given the fact the patient has no insurance this may be difficult.  We will make efforts to liberate from supplemental oxygen entirely prior to discharge  Leukocytosis:  likely secondary to steroid use.  Will continue to monitor   Tobacco abuse:  not smoked in 3 days & wants to quit smoking for good.   Offered nicotine patch but pt declined.    Hypertension: continue on HCTZ, lisinopril  CAD: stable.  No evidence of decompensation.   Pre-DM: HbA1c 6.3. Diet and exercise counseling. Dietitian consulted   Elevated troponins: likely secondary to demand ischemia. No chest pain. Trending down   DVT prophylaxis: lovenox Code Status: full  Family Communication: Spouse Silvano Rusk  [phone] via phone on 10/06/2019 Disposition Plan: Return back to previous home environment.  Patient still with some supplemental oxygen requirement though improving over interval.  Will attempt to liberate entirely from supplemental oxygen prior to discharge.  Home oxygen is ordered in case we are not able to wean off  oxygen entirely however patient is uninsured so requiring home oxygen in a difficult   Consultants:   None   Procedures: None   Antimicrobials: azithromycin    Subjective: Shortness of breath improving Patient titrated down to 3 L  Objective: Vitals:   10/06/19 0806 10/06/19 1015 10/06/19 1156 10/06/19 1335  BP: 125/64  (!) 142/81   Pulse: 71  85   Resp: 18  18   Temp: 97.6 F (36.4 C)  97.8 F (36.6 C)   TempSrc: Oral  Oral   SpO2: 97% 93% 94% 95%  Weight:      Height:        Intake/Output Summary (Last 24 hours) at 10/06/2019 1415 Last data filed at 10/06/2019 1354 Gross per 24 hour  Intake 1080 ml  Output 2600 ml  Net -1520 ml   Filed Weights   10/04/19 0453 10/05/19 0419 10/06/19 0500  Weight: 93.6 kg 93.1 kg 92.9 kg    Examination:  General exam: Appears calm and comfortable  Respiratory system: decreased breath sounds b/l. Intermittent wheezes, no rales Cardiovascular system: S1 & S2 +. No rubs, gallops or clicks. B/l LE edema Gastrointestinal system: Abdomen is obese, soft and nontender.  Hypoactive bowel sounds heard. Central nervous system: Alert and oriented. Moves all 4 extremities  Psychiatry: Judgement and insight appear normal. Flat mood and affect.     Data Reviewed: I have personally reviewed following labs and imaging studies  CBC: Recent Labs  Lab 10/02/19 1551 10/03/19 0013 10/04/19 0446 10/05/19 0514 10/06/19 0428  WBC 8.9 9.5 14.6* 12.2* 8.8  HGB 16.1* 16.5* 15.9* 16.4* 16.1*  HCT 52.2* 53.3* 50.9* 53.9* 53.2*  MCV 94.7 94.7 93.2 94.4 95.5  PLT 212 220 231 247 226   Basic Metabolic Panel: Recent Labs  Lab 10/02/19 1551 10/02/19 2151 10/03/19 0013 10/03/19 0445 10/04/19 0446 10/05/19 0514 10/06/19 0428  NA   < >  --  138 139 138 138 141  K   < >  --  4.0 4.1 4.8 4.8 5.1  CL   < >  --  90* 90* 91* 88* 88*  CO2   < >  --  36* 39* 40* 41* 43*  GLUCOSE   < >  --  201* 129* 130* 117* 125*  BUN   < >  --  15 15 30* 23*  21*  CREATININE   < >  --  0.64 0.60 0.69 0.62 0.54  CALCIUM   < >  --  8.4* 8.4* 8.4* 8.6* 8.5*  MG  --  2.4  --   --   --   --   --    < > = values in this interval not displayed.   GFR: Estimated Creatinine Clearance: 87.5 mL/min (by C-G formula based on SCr of 0.54 mg/dL). Liver Function Tests: Recent Labs  Lab 10/03/19 0013  AST 28  ALT 27  ALKPHOS 95  BILITOT 0.5  PROT 6.5  ALBUMIN 3.5   No results for input(s): LIPASE, AMYLASE in the last 168 hours. No results for input(s): AMMONIA in the last 168 hours. Coagulation Profile: No results for input(s): INR, PROTIME in the last 168 hours. Cardiac Enzymes: No results for input(s): CKTOTAL, CKMB, CKMBINDEX, TROPONINI in the last 168 hours. BNP (last 3 results) No results for input(s): PROBNP in the last 8760 hours. HbA1C: No results for input(s): HGBA1C in the last 72 hours. CBG: Recent Labs  Lab 10/05/19 2106  GLUCAP 134*   Lipid Profile: No results for input(s): CHOL, HDL, LDLCALC, TRIG, CHOLHDL, LDLDIRECT in the last 72 hours. Thyroid Function Tests: No results for input(s): TSH, T4TOTAL, FREET4, T3FREE, THYROIDAB in the last 72 hours. Anemia Panel: No results for input(s): VITAMINB12, FOLATE, FERRITIN, TIBC, IRON, RETICCTPCT in the last 72 hours. Sepsis Labs: Recent Labs  Lab 10/03/19 0445 10/04/19 0446  PROCALCITON <0.10 <0.10    Recent Results (from the past 240 hour(s))  Respiratory Panel by RT PCR (Flu A&B, Covid) - Nasopharyngeal Swab     Status: None   Collection Time: 10/02/19  4:53 PM   Specimen: Nasopharyngeal Swab  Result Value Ref Range Status   SARS Coronavirus 2 by RT PCR NEGATIVE NEGATIVE Final    Comment: (NOTE) SARS-CoV-2 target nucleic acids are NOT DETECTED. The SARS-CoV-2 RNA is generally detectable in upper respiratoy specimens during the acute phase of infection. The lowest concentration of SARS-CoV-2 viral copies this assay can detect is 131 copies/mL. A negative result does not  preclude SARS-Cov-2 infection and should not be used as the sole basis for treatment or other patient management decisions. A negative result may occur with  improper specimen collection/handling, submission of specimen other than nasopharyngeal swab, presence of viral mutation(s) within the areas targeted by this assay, and inadequate number of viral copies (<131 copies/mL). A negative result must be combined with clinical observations, patient history, and epidemiological information. The expected result is Negative. Fact Sheet for Patients:  https://www.moore.com/ Fact Sheet for Healthcare Providers:  https://www.young.biz/ This test is not yet ap proved or cleared by the [place] FDA and  has been authorized for detection and/or  diagnosis of SARS-CoV-2 by FDA under an Emergency Use Authorization (EUA). This EUA will remain  in effect (meaning this test can be used) for the duration of the COVID-19 declaration under Section 564(b)(1) of the Act, 21 U.S.C. section 360bbb-3(b)(1), unless the authorization is terminated or revoked sooner.    Influenza A by PCR NEGATIVE NEGATIVE Final   Influenza B by PCR NEGATIVE NEGATIVE Final    Comment: (NOTE) The Xpert Xpress SARS-CoV-2/FLU/RSV assay is intended as an aid in  the diagnosis of influenza from Nasopharyngeal swab specimens and  should not be used as a sole basis for treatment. Nasal washings and  aspirates are unacceptable for Xpert Xpress SARS-CoV-2/FLU/RSV  testing. Fact Sheet for Patients: https://www.moore.com/ Fact Sheet for Healthcare Providers: https://www.young.biz/ This test is not yet approved or cleared by the [place] FDA and  has been authorized for detection and/or diagnosis of SARS-CoV-2 by  FDA under an Emergency Use Authorization (EUA). This EUA will remain  in effect (meaning this test can be used) for the duration of the    Covid-19 declaration under Section 564(b)(1) of the Act, 21  U.S.C. section 360bbb-3(b)(1), unless the authorization is  terminated or revoked. Performed at [facility], [address], [place], [place] [address]          Radiology Studies: No results found.      Scheduled Meds: . aspirin EC  81 mg Oral Daily  . azithromycin  500 mg Oral Daily  . enoxaparin (LOVENOX) injection  40 mg Subcutaneous Q24H  . hydrochlorothiazide  25 mg Oral Daily  . ipratropium-albuterol  3 mL Nebulization TID  . methylPREDNISolone (SOLU-MEDROL) injection  40 mg Intravenous Q12H  . pravastatin  20 mg Oral q1800  . sodium chloride flush  3 mL Intravenous Q12H  . tiotropium  18 mcg Inhalation Daily   Continuous Infusions:    LOS: 3 days    Time spent: 30 mins    Tresa Moore, MD [facility] Pager [phone]  If 7PM-7AM, please contact night-coverage [email or web address] 10/06/2019, 2:15 PM

## 2019-10-07 MED ORDER — FUROSEMIDE 10 MG/ML IJ SOLN
40.0000 mg | Freq: Two times a day (BID) | INTRAMUSCULAR | Status: DC
  Administered 2019-10-07 – 2019-10-08 (×3): 40 mg via INTRAVENOUS
  Filled 2019-10-07 (×3): qty 4

## 2019-10-07 MED ORDER — MAGNESIUM CITRATE PO SOLN
1.0000 | Freq: Once | ORAL | Status: DC
  Filled 2019-10-07: qty 296

## 2019-10-07 NOTE — Progress Notes (Signed)
Patient has maintained 02 sat low to mid 90s throughout the night.

## 2019-10-07 NOTE — Progress Notes (Signed)
PROGRESS NOTE    Virginia Norman  XBD:[id number] DOB: 13-Feb-1961 DOA: 10/02/2019 PCP: Martie Round, NP      Assessment & Plan:   Principal Problem:   COPD exacerbation (HCC) Active Problems:   Benign essential HTN   CAD (coronary artery disease)   COPD exacerbation:  Improving over interval Air movement still poor but wheezing improved Plan: Decrease IV steroids, 40 mg every 12, plan to continue taper aggressively if oxygen status improved Stress incentive spirometry use DuoNeb scheduled Add schedule Brovana Add scheduled Pulmicort Ambulate, continue to taper oxygen as tolerated   Acute hypoxic respiratory failure:  secondary to above.  Continue on supplemental oxygen and wean as tolerated.  Does not use supplemental oxygen at home.  Pt also has no insurance to possibly pay for oxygen if needed at d/c.  Management as stated above  Home oxygen ordered however given the fact the patient has no insurance this may be difficult.  We will make efforts to liberate from supplemental oxygen entirely prior to discharge  Leukocytosis:  likely secondary to steroid use.  Will continue to monitor   Tobacco abuse:  not smoked in 3 days & wants to quit smoking for good.   Offered nicotine patch but pt declined.    Hypertension: continue on HCTZ, lisinopril  CAD: stable.  No evidence of decompensation.   Pre-DM: HbA1c 6.3. Diet and exercise counseling. Dietitian consulted   Elevated troponins: likely secondary to demand ischemia. No chest pain. Trending down   Bilateral lower extremity edema, patient feels improvement Continue hydrochlorothiazide PTA dose Started Lasix 40 mg IV every 12 hourly TTE shows LVEF 65%, grade 1 diastolic dysfunction BNP 454     DVT prophylaxis: lovenox Code Status: full  Family Communication: Spouse Coy Saunas  [phone] via phone on 10/06/2019 Disposition Plan: Return back to previous home environment.  Patient still with some  supplemental oxygen requirement though improving over interval.  Will attempt to liberate entirely from supplemental oxygen prior to discharge.  Home oxygen is ordered in case we are not able to wean off oxygen entirely however patient is uninsured so requiring home oxygen in a difficult   Consultants:   None   Procedures: None   Antimicrobials: azithromycin    Subjective: Shortness of breath improving Patient titrated down to 3 L  Objective: Vitals:   10/07/19 0756 10/07/19 0806 10/07/19 1152 10/07/19 1614  BP:  (!) 160/96 133/73 140/86  Pulse:  73 84 82  Resp:  18 18 18   Temp:  97.9 F (36.6 C) 98.6 F (37 C) 98.3 F (36.8 C)  TempSrc:   Oral Oral  SpO2: 90% 96% 93% 93%  Weight:      Height:        Intake/Output Summary (Last 24 hours) at 10/07/2019 1632 Last data filed at 10/07/2019 1616 Gross per 24 hour  Intake 483 ml  Output 5000 ml  Net -4517 ml   Filed Weights   10/05/19 0419 10/06/19 0500 10/07/19 0444  Weight: 93.1 kg 92.9 kg 90.5 kg    Examination:  General exam: Appears calm and comfortable  Respiratory system: decreased breath sounds b/l. Intermittent wheezes, no rales Cardiovascular system: S1 & S2 +. No rubs, gallops or clicks. B/l LE edema 2+ Gastrointestinal system: Abdomen is obese, soft and nontender.  Hypoactive bowel sounds heard. Central nervous system: Alert and oriented. Moves all 4 extremities  Psychiatry: Judgement and insight appear normal. Flat mood and affect.     Data Reviewed: I have personally  reviewed following labs and imaging studies  CBC: Recent Labs  Lab 10/02/19 1551 10/03/19 0013 10/04/19 0446 10/05/19 0514 10/06/19 0428  WBC 8.9 9.5 14.6* 12.2* 8.8  HGB 16.1* 16.5* 15.9* 16.4* 16.1*  HCT 52.2* 53.3* 50.9* 53.9* 53.2*  MCV 94.7 94.7 93.2 94.4 95.5  PLT 212 220 231 247 376   Basic Metabolic Panel: Recent Labs  Lab 10/02/19 1551 10/02/19 2151 10/03/19 0013 10/03/19 0445 10/04/19 0446 10/05/19 0514  10/06/19 0428  NA   < >  --  138 139 138 138 141  K   < >  --  4.0 4.1 4.8 4.8 5.1  CL   < >  --  90* 90* 91* 88* 88*  CO2   < >  --  36* 39* 40* 41* 43*  GLUCOSE   < >  --  201* 129* 130* 117* 125*  BUN   < >  --  15 15 30* 23* 21*  CREATININE   < >  --  0.64 0.60 0.69 0.62 0.54  CALCIUM   < >  --  8.4* 8.4* 8.4* 8.6* 8.5*  MG  --  2.4  --   --   --   --   --    < > = values in this interval not displayed.   GFR: Estimated Creatinine Clearance: 86.4 mL/min (by C-G formula based on SCr of 0.54 mg/dL). Liver Function Tests: Recent Labs  Lab 10/03/19 0013  AST 28  ALT 27  ALKPHOS 95  BILITOT 0.5  PROT 6.5  ALBUMIN 3.5   No results for input(s): LIPASE, AMYLASE in the last 168 hours. No results for input(s): AMMONIA in the last 168 hours. Coagulation Profile: No results for input(s): INR, PROTIME in the last 168 hours. Cardiac Enzymes: No results for input(s): CKTOTAL, CKMB, CKMBINDEX, TROPONINI in the last 168 hours. BNP (last 3 results) No results for input(s): PROBNP in the last 8760 hours. HbA1C: No results for input(s): HGBA1C in the last 72 hours. CBG: Recent Labs  Lab 10/05/19 2106 10/06/19 1649  GLUCAP 134* 105*   Lipid Profile: No results for input(s): CHOL, HDL, LDLCALC, TRIG, CHOLHDL, LDLDIRECT in the last 72 hours. Thyroid Function Tests: No results for input(s): TSH, T4TOTAL, FREET4, T3FREE, THYROIDAB in the last 72 hours. Anemia Panel: No results for input(s): VITAMINB12, FOLATE, FERRITIN, TIBC, IRON, RETICCTPCT in the last 72 hours. Sepsis Labs: Recent Labs  Lab 10/03/19 0445 10/04/19 0446  PROCALCITON <0.10 <0.10    Recent Results (from the past 240 hour(s))  Respiratory Panel by RT PCR (Flu A&B, Covid) - Nasopharyngeal Swab     Status: None   Collection Time: 10/02/19  4:53 PM   Specimen: Nasopharyngeal Swab  Result Value Ref Range Status   SARS Coronavirus 2 by RT PCR NEGATIVE NEGATIVE Final    Comment: (NOTE) SARS-CoV-2 target nucleic  acids are NOT DETECTED. The SARS-CoV-2 RNA is generally detectable in upper respiratoy specimens during the acute phase of infection. The lowest concentration of SARS-CoV-2 viral copies this assay can detect is 131 copies/mL. A negative result does not preclude SARS-Cov-2 infection and should not be used as the sole basis for treatment or other patient management decisions. A negative result may occur with  improper specimen collection/handling, submission of specimen other than nasopharyngeal swab, presence of viral mutation(s) within the areas targeted by this assay, and inadequate number of viral copies (<131 copies/mL). A negative result must be combined with clinical observations, patient history, and epidemiological information.  The expected result is Negative. Fact Sheet for Patients:  https://www.moore.com/ Fact Sheet for Healthcare Providers:  https://www.young.biz/ This test is not yet ap proved or cleared by the [place] FDA and  has been authorized for detection and/or diagnosis of SARS-CoV-2 by FDA under an Emergency Use Authorization (EUA). This EUA will remain  in effect (meaning this test can be used) for the duration of the COVID-19 declaration under Section 564(b)(1) of the Act, 21 U.S.C. section 360bbb-3(b)(1), unless the authorization is terminated or revoked sooner.    Influenza A by PCR NEGATIVE NEGATIVE Final   Influenza B by PCR NEGATIVE NEGATIVE Final    Comment: (NOTE) The Xpert Xpress SARS-CoV-2/FLU/RSV assay is intended as an aid in  the diagnosis of influenza from Nasopharyngeal swab specimens and  should not be used as a sole basis for treatment. Nasal washings and  aspirates are unacceptable for Xpert Xpress SARS-CoV-2/FLU/RSV  testing. Fact Sheet for Patients: https://www.moore.com/ Fact Sheet for Healthcare Providers: https://www.young.biz/ This test is not yet  approved or cleared by the [place] FDA and  has been authorized for detection and/or diagnosis of SARS-CoV-2 by  FDA under an Emergency Use Authorization (EUA). This EUA will remain  in effect (meaning this test can be used) for the duration of the  Covid-19 declaration under Section 564(b)(1) of the Act, 21  U.S.C. section 360bbb-3(b)(1), unless the authorization is  terminated or revoked. Performed at [facility], [address], [place], [place] [address]          Radiology Studies: No results found.      Scheduled Meds: . arformoterol  15 mcg Nebulization BID  . aspirin EC  81 mg Oral Daily  . budesonide (PULMICORT) nebulizer solution  0.25 mg Nebulization BID  . enoxaparin (LOVENOX) injection  40 mg Subcutaneous Q24H  . furosemide  40 mg Intravenous BID  . hydrochlorothiazide  25 mg Oral Daily  . ipratropium-albuterol  3 mL Nebulization TID  . methylPREDNISolone (SOLU-MEDROL) injection  40 mg Intravenous Q12H  . pravastatin  20 mg Oral q1800  . sodium chloride flush  3 mL Intravenous Q12H   Continuous Infusions:    LOS: 4 days    Time spent: 30 mins    Gillis Santa, MD [facility] Pager [phone]  If 7PM-7AM, please contact night-coverage [email or web address] 10/07/2019, 4:32 PM

## 2019-10-08 MED ORDER — PREDNISONE 20 MG PO TABS: 30.0000 mg | ORAL_TABLET | Freq: Every day | ORAL | Status: DC

## 2019-10-08 MED ORDER — FUROSEMIDE 20 MG PO TABS
20.0000 mg | ORAL_TABLET | Freq: Two times a day (BID) | ORAL | Status: DC
  Administered 2019-10-08 – 2019-10-09 (×2): 20 mg via ORAL
  Filled 2019-10-08 (×2): qty 1

## 2019-10-08 MED ORDER — PREDNISONE 20 MG PO TABS
40.0000 mg | ORAL_TABLET | Freq: Every day | ORAL | Status: DC
  Administered 2019-10-09: 40 mg via ORAL
  Filled 2019-10-08: qty 2

## 2019-10-08 MED ORDER — PREDNISONE 20 MG PO TABS: 20.0000 mg | ORAL_TABLET | Freq: Every day | ORAL | Status: DC

## 2019-10-08 MED ORDER — PREDNISONE 10 MG PO TABS: 10.0000 mg | ORAL_TABLET | Freq: Every day | ORAL | Status: DC

## 2019-10-08 NOTE — Progress Notes (Signed)
PROGRESS NOTE    Virginia Norman  GYF:[id number] DOB: 08/18/1960 DOA: 10/02/2019 PCP: Martie Round, NP      Assessment & Plan:   Principal Problem:   COPD exacerbation (HCC) Active Problems:   Benign essential HTN   CAD (coronary artery disease)   COPD exacerbation:  Improving over interval Air movement still poor but wheezing improved Plan: S/p IV steroids, 4/5 started oral prednisone tapering dose Stress incentive spirometry use DuoNeb scheduled Add schedule Brovana Add scheduled Pulmicort Ambulate, continue to taper oxygen as tolerated   Acute hypoxic respiratory failure:  secondary to above.  Continue on supplemental oxygen and wean as tolerated.  Does not use supplemental oxygen at home.  Pt also has no insurance to possibly pay for oxygen if needed at d/c.  Management as stated above  Home oxygen ordered however given the fact the patient has no insurance this may be difficult.  We will make efforts to liberate from supplemental oxygen entirely prior to discharge 4/4 patient was saturating 85% on room air, needed for letter nasal cannula to maintain O2 saturation 90-92%    Leukocytosis:  likely secondary to steroid use.  Will continue to monitor   Tobacco abuse:  not smoked in 3 days & wants to quit smoking for good.   Offered nicotine patch but pt declined.    Hypertension: continue on HCTZ, lisinopril  CAD: stable.  No evidence of decompensation.   Pre-DM: HbA1c 6.3. Diet and exercise counseling. Dietitian consulted   Elevated troponins: likely secondary to demand ischemia. No chest pain. Trending down   Bilateral lower extremity edema, resolved after IV Lasix Continue hydrochlorothiazide PTA dose S/p Lasix 40 mg IV every 12 hourly,  4/4 switch to oral Lasix 20 mg p.o. twice daily TTE shows LVEF 65%, grade 1 diastolic dysfunction BNP 454     DVT prophylaxis: lovenox Code Status: full  Family Communication: Spouse Coy Saunas   [phone] via phone on 10/06/2019 Disposition Plan: Return back to previous home environment.  Patient still with some supplemental oxygen requirement though improving over interval.  Will attempt to liberate entirely from supplemental oxygen prior to discharge.  Home oxygen is ordered in case we are not able to wean off oxygen entirely however patient is uninsured so requiring home oxygen in a difficult Follow social worker regarding cost of oxygen for home use   Consultants:   None   Procedures: None   Antimicrobials: azithromycin    Subjective: Patient was seen and examined at bedside, feels improvement in the shortness of breath, lower extremity edema has been resolved. Patient is saturating 85% on room air, requires 4 L via nasal cannula to maintain above 90% and patient will definitely require more well ambulation   Objective: Vitals:   10/08/19 0740 10/08/19 0759 10/08/19 1105 10/08/19 1548  BP:  122/79 128/79 133/89  Pulse:  69 81 83  Resp:  17 16 16   Temp:  (!) 97.5 F (36.4 C) 98 F (36.7 C) 97.6 F (36.4 C)  TempSrc:  Oral    SpO2: 91% 94% 91% 92%  Weight:      Height:        Intake/Output Summary (Last 24 hours) at 10/08/2019 1603 Last data filed at 10/08/2019 1547 Gross per 24 hour  Intake 690 ml  Output 6500 ml  Net -5810 ml   Filed Weights   10/06/19 0500 10/07/19 0444 10/08/19 0349  Weight: 92.9 kg 90.5 kg 87.5 kg    Examination:  General exam: Appears calm  and comfortable  Respiratory system: decreased breath sounds b/l. Intermittent wheezes, no rales Cardiovascular system: S1 & S2 +. No rubs, gallops or clicks. B/l LE edema resolved Gastrointestinal system: Abdomen is obese, soft and nontender.  Hypoactive bowel sounds heard. Central nervous system: Alert and oriented. Moves all 4 extremities  Psychiatry: Judgement and insight appear normal. Flat mood and affect.     Data Reviewed: I have personally reviewed following labs and imaging  studies  CBC: Recent Labs  Lab 10/02/19 1551 10/03/19 0013 10/04/19 0446 10/05/19 0514 10/06/19 0428  WBC 8.9 9.5 14.6* 12.2* 8.8  HGB 16.1* 16.5* 15.9* 16.4* 16.1*  HCT 52.2* 53.3* 50.9* 53.9* 53.2*  MCV 94.7 94.7 93.2 94.4 95.5  PLT 212 220 231 247 226   Basic Metabolic Panel: Recent Labs  Lab 10/02/19 1551 10/02/19 2151 10/03/19 0013 10/03/19 0445 10/04/19 0446 10/05/19 0514 10/06/19 0428  NA   < >  --  138 139 138 138 141  K   < >  --  4.0 4.1 4.8 4.8 5.1  CL   < >  --  90* 90* 91* 88* 88*  CO2   < >  --  36* 39* 40* 41* 43*  GLUCOSE   < >  --  201* 129* 130* 117* 125*  BUN   < >  --  15 15 30* 23* 21*  CREATININE   < >  --  0.64 0.60 0.69 0.62 0.54  CALCIUM   < >  --  8.4* 8.4* 8.4* 8.6* 8.5*  MG  --  2.4  --   --   --   --   --    < > = values in this interval not displayed.   GFR: Estimated Creatinine Clearance: 85 mL/min (by C-G formula based on SCr of 0.54 mg/dL). Liver Function Tests: Recent Labs  Lab 10/03/19 0013  AST 28  ALT 27  ALKPHOS 95  BILITOT 0.5  PROT 6.5  ALBUMIN 3.5   No results for input(s): LIPASE, AMYLASE in the last 168 hours. No results for input(s): AMMONIA in the last 168 hours. Coagulation Profile: No results for input(s): INR, PROTIME in the last 168 hours. Cardiac Enzymes: No results for input(s): CKTOTAL, CKMB, CKMBINDEX, TROPONINI in the last 168 hours. BNP (last 3 results) No results for input(s): PROBNP in the last 8760 hours. HbA1C: No results for input(s): HGBA1C in the last 72 hours. CBG: Recent Labs  Lab 10/05/19 2106 10/06/19 1649  GLUCAP 134* 105*   Lipid Profile: No results for input(s): CHOL, HDL, LDLCALC, TRIG, CHOLHDL, LDLDIRECT in the last 72 hours. Thyroid Function Tests: No results for input(s): TSH, T4TOTAL, FREET4, T3FREE, THYROIDAB in the last 72 hours. Anemia Panel: No results for input(s): VITAMINB12, FOLATE, FERRITIN, TIBC, IRON, RETICCTPCT in the last 72 hours. Sepsis Labs: Recent Labs   Lab 10/03/19 0445 10/04/19 0446  PROCALCITON <0.10 <0.10    Recent Results (from the past 240 hour(s))  Respiratory Panel by RT PCR (Flu A&B, Covid) - Nasopharyngeal Swab     Status: None   Collection Time: 10/02/19  4:53 PM   Specimen: Nasopharyngeal Swab  Result Value Ref Range Status   SARS Coronavirus 2 by RT PCR NEGATIVE NEGATIVE Final    Comment: (NOTE) SARS-CoV-2 target nucleic acids are NOT DETECTED. The SARS-CoV-2 RNA is generally detectable in upper respiratoy specimens during the acute phase of infection. The lowest concentration of SARS-CoV-2 viral copies this assay can detect is 131 copies/mL. A negative result  does not preclude SARS-Cov-2 infection and should not be used as the sole basis for treatment or other patient management decisions. A negative result may occur with  improper specimen collection/handling, submission of specimen other than nasopharyngeal swab, presence of viral mutation(s) within the areas targeted by this assay, and inadequate number of viral copies (<131 copies/mL). A negative result must be combined with clinical observations, patient history, and epidemiological information. The expected result is Negative. Fact Sheet for Patients:  PinkCheek.be Fact Sheet for Healthcare Providers:  GravelBags.it This test is not yet ap proved or cleared by the [place] FDA and  has been authorized for detection and/or diagnosis of SARS-CoV-2 by FDA under an Emergency Use Authorization (EUA). This EUA will remain  in effect (meaning this test can be used) for the duration of the COVID-19 declaration under Section 564(b)(1) of the Act, 21 U.S.C. section 360bbb-3(b)(1), unless the authorization is terminated or revoked sooner.    Influenza A by PCR NEGATIVE NEGATIVE Final   Influenza B by PCR NEGATIVE NEGATIVE Final    Comment: (NOTE) The Xpert Xpress SARS-CoV-2/FLU/RSV assay is intended as  an aid in  the diagnosis of influenza from Nasopharyngeal swab specimens and  should not be used as a sole basis for treatment. Nasal washings and  aspirates are unacceptable for Xpert Xpress SARS-CoV-2/FLU/RSV  testing. Fact Sheet for Patients: PinkCheek.be Fact Sheet for Healthcare Providers: GravelBags.it This test is not yet approved or cleared by the [place] FDA and  has been authorized for detection and/or diagnosis of SARS-CoV-2 by  FDA under an Emergency Use Authorization (EUA). This EUA will remain  in effect (meaning this test can be used) for the duration of the  Covid-19 declaration under Section 564(b)(1) of the Act, 21  U.S.C. section 360bbb-3(b)(1), unless the authorization is  terminated or revoked. Performed at [facility], [address], [place], [place] [address]          Radiology Studies: No results found.      Scheduled Meds: . arformoterol  15 mcg Nebulization BID  . aspirin EC  81 mg Oral Daily  . budesonide (PULMICORT) nebulizer solution  0.25 mg Nebulization BID  . enoxaparin (LOVENOX) injection  40 mg Subcutaneous Q24H  . furosemide  40 mg Intravenous BID  . hydrochlorothiazide  25 mg Oral Daily  . ipratropium-albuterol  3 mL Nebulization TID  . methylPREDNISolone (SOLU-MEDROL) injection  40 mg Intravenous Q12H  . pravastatin  20 mg Oral q1800  . sodium chloride flush  3 mL Intravenous Q12H   Continuous Infusions:    LOS: 5 days    Time spent: 30 mins    Val Riles, MD [facility] Pager [phone]  If 7PM-7AM, please contact night-coverage [email or web address] 10/08/2019, 4:03 PM

## 2019-10-09 DIAGNOSIS — I5031 Acute diastolic (congestive) heart failure: Secondary | ICD-10-CM

## 2019-10-09 DIAGNOSIS — J9621 Acute and chronic respiratory failure with hypoxia: Secondary | ICD-10-CM

## 2019-10-09 LAB — BASIC METABOLIC PANEL
Anion gap: 11 (ref 5–15)
BUN: 25 mg/dL — ABNORMAL HIGH (ref 6–20)
CO2: 42 mmol/L — ABNORMAL HIGH (ref 22–32)
Calcium: 8.8 mg/dL — ABNORMAL LOW (ref 8.9–10.3)
Chloride: 85 mmol/L — ABNORMAL LOW (ref 98–111)
Creatinine, Ser: 0.79 mg/dL (ref 0.44–1.00)
GFR calc Af Amer: >60 mL/min (ref >60)
GFR calc non Af Amer: >60 mL/min (ref >60)
Glucose, Bld: 87 mg/dL (ref 70–99)
Potassium: 4.5 mmol/L (ref 3.5–5.1)
Sodium: 138 mmol/L (ref 135–145)

## 2019-10-09 LAB — PHOSPHORUS: Phosphorus: 5.1 mg/dL — ABNORMAL HIGH (ref 2.5–4.6)

## 2019-10-09 LAB — CBC
HCT: 53.6 % — ABNORMAL HIGH (ref 36.0–46.0)
Hemoglobin: 17.3 g/dL — ABNORMAL HIGH (ref 12.0–15.0)
MCH: 29 pg (ref 26.0–34.0)
MCHC: 32.3 g/dL (ref 30.0–36.0)
MCV: 89.8 fL (ref 80.0–100.0)
Platelets: 214 10*3/uL (ref 150–400)
RBC: 5.97 MIL/uL — ABNORMAL HIGH (ref 3.87–5.11)
RDW: 14.3 % (ref 11.5–15.5)
WBC: 8.5 10*3/uL (ref 4.0–10.5)
nRBC: 0 % (ref 0.0–0.2)

## 2019-10-09 LAB — MAGNESIUM: Magnesium: 2.8 mg/dL — ABNORMAL HIGH (ref 1.7–2.4)

## 2019-10-09 MED ORDER — ASPIRIN 81 MG PO TBEC: 81.0000 mg | DELAYED_RELEASE_TABLET | Freq: Every day | ORAL | 0 refills | Status: DC

## 2019-10-09 MED ORDER — FUROSEMIDE 20 MG PO TABS: 20.0000 mg | ORAL_TABLET | Freq: Every day | ORAL | Status: DC

## 2019-10-09 MED ORDER — PREDNISONE 10 MG PO TABS: ORAL_TABLET | ORAL | 0 refills | Status: DC

## 2019-10-09 MED ORDER — ALBUTEROL SULFATE HFA 108 (90 BASE) MCG/ACT IN AERS: 2.0000 | INHALATION_SPRAY | Freq: Four times a day (QID) | RESPIRATORY_TRACT | 1 refills | Status: DC | PRN

## 2019-10-09 NOTE — Plan of Care (Signed)

## 2019-10-09 NOTE — TOC Progression Note (Signed)
Transition of Care [facility]) - Progression Note    Patient Details  Name: Virginia Norman MRN: [id number] Date of Birth: 1961-02-14  Transition of Care [facility]) CM/SW Contact  Shawn Route, RN Phone Number: 10/09/2019, 2:10 PM  Clinical Narrative:    Contacted for oxygen needs.  Patient has no insurance or payer source.  Asked Brad with [employer] to check with patient and see if she is eligible for [facility] or/payment plan for oxygen.         Expected Discharge Plan and Services                                                 Social Determinants of Health (SDOH) Interventions    Readmission Risk Interventions No flowsheet data found.

## 2019-10-09 NOTE — Progress Notes (Signed)
Patient given discharge instructions. IV taken out and tele monitor off. Pt verbalized understanding without any questions or concerns.

## 2019-10-09 NOTE — Discharge Instructions (Signed)
Use your oxygen as instructed. Use your inhalers 3 to 4 times a day as needed follow-up with her primary care smoking cessation advised

## 2019-10-09 NOTE — Discharge Summary (Signed)
[facility] [place] at [facility]   PATIENT NAME: Virginia Norman    MR#:  [id number]  DATE OF BIRTH:  Jan 05, 1961  DATE OF ADMISSION:  10/02/2019 ADMITTING PHYSICIAN: Charise Killian, MD  DATE OF DISCHARGE: 10/09/2019  PRIMARY CARE PHYSICIAN: Martie Round, NP    ADMISSION DIAGNOSIS:  Shortness of breath [R06.02] Hypoxia [R09.02] COPD exacerbation (HCC) [J44.1]  DISCHARGE DIAGNOSIS:  Acute hypoxic respiratory secondary to COPD exacerbation Acute diastolic congestive heart failure/Cor [place]mon[place]-- improved severe tobacco abuse-- now quit  SECONDARY DIAGNOSIS:   Past Medical History:  Diagnosis Date  . COPD (chronic obstructive pulmonary disease) (HCC)   . Coronary artery disease   . Hypertension     HOSPITAL COURSE:  [name] is a 59 y.o. female with medical history significant of COPD, hypertension, coronary artery disease who presented with progressive shortness of breath cough and wheezing for the last 3 days.  Patient continues to smoke until 3 days ago.  Acute on chronic COPD exacerbation: Improving over interval Air movement still poor but wheezing improved-- switch to oral steroid taper Stress incentive spirometry use DuoNeb scheduled in-house. proventil as outpatient. Patient unable to afford nebulizer. Ambulate, continue to taper oxygen as tolerated-- patient is getting oxygen set up through charity. on 3.5 L nasal cannula oxygen  Acute hypoxic respiratory failure due to COPD exacerbation and congestive heart failure acute diastolic  (new) in the setting of severe emphysema -Continue on supplemental oxygen and wean as tolerated.  -Does not use supplemental oxygen at home.  -Pt also has no insurance to possibly pay for oxygen-- charity oxygen has been set up -sliding 94 on 3.5 L. -Oral inhalers as outpatient -steroid taper -received IV Lasix. Patient diuresis about 20 L UOP-- leg edema resolved. Resume hydrochlorothiazide. -TTE  showed EF of 60 to 65%. No valvular abnormality. -BNP was 454 with bilateral venous congestion noted on chest x-ray on admission.  Leukocytosis:  likely secondary to steroid use.  Will continue to monitor   Tobacco abuse:  not smoked in 3 days & wants to quit smoking for good.  Offered nicotine patch but pt declined.   Hypertension:continue on HCTZ, lisinopril  ZJQ:BHALPF. No evidence of decompensation.   Pre-DM: HbA1c 6.3. Diet and exercise counseling. Dietitian consulted   Elevated troponins: likely secondary to demand ischemia. No chest pain. Trending down   Bilateral lower extremity edema, resolved after IV Lasix Continue hydrochlorothiazide PTA dose S/p Lasix 40 mg IV every 12 hourly  4/4 switch to oral Lasix 20 mg p.o. twice daily-- now discontinued TTE shows LVEF 65%, grade 1 diastolic dysfunction BNP 454  patient appears best at baseline. She will discharge to home. She is recommended to follow-up closely with PCP as outpatient and see if she is able to be weaned off oxygen in the next few weeks  DVT prophylaxis: lovenox Code Status: full  Family Communication: Spouse Coy Saunas  [phone] via phone on 10/06/2019 by dr Lucianne Muss Disposition Plan: Return back to previous home environment with charity oxygen CONSULTS OBTAINED:    DRUG ALLERGIES:  No Known Allergies  DISCHARGE MEDICATIONS:   Allergies as of 10/09/2019   No Known Allergies     Medication List    TAKE these medications   albuterol 108 (90 Base) MCG/ACT inhaler Commonly known as: VENTOLIN HFA Inhale 2 puffs into the lungs every 6 (six) hours as needed for wheezing or shortness of breath.   aspirin 81 MG EC tablet Take 1 tablet (81 mg total) by mouth  daily. Start taking on: October 10, 2019   hydrochlorothiazide 25 MG tablet Commonly known as: HYDRODIURIL Take 25 mg by mouth daily.   lisinopril 5 MG tablet Commonly known as: ZESTRIL Take 5 mg by mouth daily. What changed:  Another medication with the same name was removed. Continue taking this medication, and follow the directions you see here.   lovastatin 20 MG tablet Commonly known as: MEVACOR Take 20 mg by mouth at bedtime.   predniSONE 10 MG tablet Commonly known as: DELTASONE Take 50 mg daily, taper by 10 mg daily then stop            Durable Medical Equipment  (From admission, onward)         Start     Ordered   10/09/19 1100  For home use only DME oxygen  Once    Question Answer Comment  Length of Need 6 Months   Mode or (Route) Nasal cannula   Liters per Minute 3.5   Frequency Continuous (stationary and portable oxygen unit needed)   Oxygen conserving device Yes   Oxygen delivery system Gas      10/09/19 1059   10/06/19 1415  For home use only DME oxygen  Once    Question Answer Comment  Length of Need 6 Months   Liters per Minute 2   Oxygen delivery system Gas      10/06/19 1414          If you experience worsening of your admission symptoms, develop Shortness of Breath, life threatening emergency, suicidal or homicidal thoughts you must seek medical attention immediately by calling 911 or calling your MD immediately  if symptoms less severe.  You Must read complete instructions/literature along with all the possible adverse reactions/side effects for all the Medicines you take and that have been prescribed to you. Take any new Medicines after you have completely understood and accept all the possible adverse reactions/side effects.   Please note  You were cared for by a hospitalist during your hospital stay. If you have any questions about your discharge medications or the care you received while you were in the hospital after you are discharged, you can call the unit and asked to speak with the hospitalist on call if the hospitalist that took care of you is not available. Once you are discharged, your primary care physician will handle any further medical issues. Please note  that NO REFILLS for any discharge medications will be authorized once you are discharged, as it is imperative that you return to your primary care physician (or establish a relationship with a primary care physician if you do not have one) for your aftercare needs so that they can reassess your need for medications and monitor your lab values. Today   SUBJECTIVE   Feels better. Some sob at baseline however no wheezing. Leg swelling improved.  VITAL SIGNS:  Blood pressure 125/89, pulse 85, temperature 97.8 F (36.6 C), temperature source Oral, resp. rate 18, height 5\' 7"  (1.702 m), weight 86.6 kg, SpO2 92 %.  I/O:    Intake/Output Summary (Last 24 hours) at 10/09/2019 1531 Last data filed at 10/09/2019 1330 Gross per 24 hour  Intake 953 ml  Output 1350 ml  Net -397 ml    PHYSICAL EXAMINATION:  GENERAL:  59 y.o.-year-old patient lying in the bed with no acute distress.  EYES: Pupils equal, round, reactive to light and accommodation. No scleral icterus.  HEENT: Head atraumatic, normocephalic. Oropharynx and nasopharynx clear.  NECK:  Supple, no jugular venous distention. No thyroid enlargement, no tenderness.  LUNGS: Normal breath sounds bilaterally, no wheezing, rales,scattered rhonchi or crepitation. No use of accessory muscles of respiration.  CARDIOVASCULAR: S1, S2 normal. No murmurs, rubs, or gallops.  ABDOMEN: Soft, non-tender, non-distended. Bowel sounds present. No organomegaly or mass.  EXTREMITIES: No pedal edema, cyanosis, or clubbing.  NEUROLOGIC: Cranial nerves II through XII are intact. Muscle strength 5/5 in all extremities. Sensation intact. Gait not checked.  PSYCHIATRIC: The patient is alert and oriented x 3.  SKIN: No obvious rash, lesion, or ulcer.   DATA REVIEW:   CBC  Recent Labs  Lab 10/09/19 0515  WBC 8.5  HGB 17.3*  HCT 53.6*  PLT 214    Chemistries  Recent Labs  Lab 10/03/19 0013 10/03/19 0445 10/09/19 0515  NA 138   < > 138  K 4.0   < > 4.5   CL 90*   < > 85*  CO2 36*   < > 42*  GLUCOSE 201*   < > 87  BUN 15   < > 25*  CREATININE 0.64   < > 0.79  CALCIUM 8.4*   < > 8.8*  MG  --   --  2.8*  AST 28  --   --   ALT 27  --   --   ALKPHOS 95  --   --   BILITOT 0.5  --   --    < > = values in this interval not displayed.    Microbiology Results   Recent Results (from the past 240 hour(s))  Respiratory Panel by RT PCR (Flu A&B, Covid) - Nasopharyngeal Swab     Status: None   Collection Time: 10/02/19  4:53 PM   Specimen: Nasopharyngeal Swab  Result Value Ref Range Status   SARS Coronavirus 2 by RT PCR NEGATIVE NEGATIVE Final    Comment: (NOTE) SARS-CoV-2 target nucleic acids are NOT DETECTED. The SARS-CoV-2 RNA is generally detectable in upper respiratoy specimens during the acute phase of infection. The lowest concentration of SARS-CoV-2 viral copies this assay can detect is 131 copies/mL. A negative result does not preclude SARS-Cov-2 infection and should not be used as the sole basis for treatment or other patient management decisions. A negative result may occur with  improper specimen collection/handling, submission of specimen other than nasopharyngeal swab, presence of viral mutation(s) within the areas targeted by this assay, and inadequate number of viral copies (<131 copies/mL). A negative result must be combined with clinical observations, patient history, and epidemiological information. The expected result is Negative. Fact Sheet for Patients:  https://www.moore.com/ Fact Sheet for Healthcare Providers:  https://www.young.biz/ This test is not yet ap proved or cleared by the [place] FDA and  has been authorized for detection and/or diagnosis of SARS-CoV-2 by FDA under an Emergency Use Authorization (EUA). This EUA will remain  in effect (meaning this test can be used) for the duration of the COVID-19 declaration under Section 564(b)(1) of the Act, 21  U.S.C. section 360bbb-3(b)(1), unless the authorization is terminated or revoked sooner.    Influenza A by PCR NEGATIVE NEGATIVE Final   Influenza B by PCR NEGATIVE NEGATIVE Final    Comment: (NOTE) The Xpert Xpress SARS-CoV-2/FLU/RSV assay is intended as an aid in  the diagnosis of influenza from Nasopharyngeal swab specimens and  should not be used as a sole basis for treatment. Nasal washings and  aspirates are unacceptable for Xpert Xpress SARS-CoV-2/FLU/RSV  testing. Fact Sheet  for Patients: https://www.moore.com/ Fact Sheet for Healthcare Providers: https://www.young.biz/ This test is not yet approved or cleared by the [place] FDA and  has been authorized for detection and/or diagnosis of SARS-CoV-2 by  FDA under an Emergency Use Authorization (EUA). This EUA will remain  in effect (meaning this test can be used) for the duration of the  Covid-19 declaration under Section 564(b)(1) of the Act, 21  U.S.C. section 360bbb-3(b)(1), unless the authorization is  terminated or revoked. Performed at [facility], [address] [place], [place] [address]     RADIOLOGY:  No results found.   CODE STATUS:     Code Status Orders  (From admission, onward)         Start     Ordered   10/02/19 2057  Full code  Continuous     10/02/19 2056        Code Status History    This patient has a current code status but no historical code status.   Advance Care Planning Activity       TOTAL TIME TAKING CARE OF THIS PATIENT: *40* minutes.    Enedina Finner M.D  [facility]    CC: Primary care physician; Martie Round, NP

## 2019-10-09 NOTE — Progress Notes (Signed)
SATURATION QUALIFICATIONS: (This note is used to comply with regulatory documentation for home oxygen)  Patient Saturations on Room Air at Rest = 86%  Patient Saturations on Room Air while Ambulating = n/a%  Patient Saturations on 3.5 Liters of oxygen while Ambulating = 91%  Please briefly explain why patient needs home oxygen:Patient's oxygen saturation below 87% on room air. O2 89-92 on 3.5 L at rest.

## 2020-05-21 ENCOUNTER — Other Ambulatory Visit: Payer: Self-pay | Admitting: *Deleted

## 2020-05-21 DIAGNOSIS — Z87891 Personal history of nicotine dependence: Secondary | ICD-10-CM

## 2020-05-21 DIAGNOSIS — Z122 Encounter for screening for malignant neoplasm of respiratory organs: Secondary | ICD-10-CM

## 2020-05-21 NOTE — Progress Notes (Signed)
Former smoker, quit 11/04/19, 33.5 pack year history.

## 2020-06-13 ENCOUNTER — Ambulatory Visit
Admission: RE | Admit: 2020-06-13 | Discharge: 2020-06-13 | Disposition: A | Discharge status: Home / Self Care | Payer: 59 | Source: Ambulatory Visit | Attending: Nurse Practitioner | Admitting: Nurse Practitioner

## 2020-06-13 ENCOUNTER — Other Ambulatory Visit: Payer: Self-pay

## 2020-06-13 DIAGNOSIS — Z87891 Personal history of nicotine dependence: Secondary | ICD-10-CM | POA: Diagnosis not present

## 2020-06-13 DIAGNOSIS — Z122 Encounter for screening for malignant neoplasm of respiratory organs: Secondary | ICD-10-CM

## 2020-06-18 ENCOUNTER — Encounter: Payer: Self-pay | Admitting: *Deleted

## 2021-07-16 ENCOUNTER — Inpatient Hospital Stay
Admission: EM | Admit: 2021-07-16 | Discharge: 2021-07-22 | DRG: 871 | Disposition: A | Discharge status: Home / Self Care | Payer: Self-pay | Attending: Internal Medicine | Admitting: Internal Medicine

## 2021-07-16 ENCOUNTER — Emergency Department: Payer: Self-pay

## 2021-07-16 ENCOUNTER — Inpatient Hospital Stay: Payer: Self-pay

## 2021-07-16 ENCOUNTER — Encounter: Payer: Self-pay | Admitting: Medical Oncology

## 2021-07-16 DIAGNOSIS — I110 Hypertensive heart disease with heart failure: Secondary | ICD-10-CM | POA: Diagnosis present

## 2021-07-16 DIAGNOSIS — I959 Hypotension, unspecified: Secondary | ICD-10-CM | POA: Diagnosis present

## 2021-07-16 DIAGNOSIS — J9601 Acute respiratory failure with hypoxia: Secondary | ICD-10-CM | POA: Diagnosis present

## 2021-07-16 DIAGNOSIS — A419 Sepsis, unspecified organism: Principal | ICD-10-CM | POA: Diagnosis present

## 2021-07-16 DIAGNOSIS — Z8249 Family history of ischemic heart disease and other diseases of the circulatory system: Secondary | ICD-10-CM

## 2021-07-16 DIAGNOSIS — Z87891 Personal history of nicotine dependence: Secondary | ICD-10-CM

## 2021-07-16 DIAGNOSIS — Z7982 Long term (current) use of aspirin: Secondary | ICD-10-CM

## 2021-07-16 DIAGNOSIS — E860 Dehydration: Secondary | ICD-10-CM | POA: Diagnosis present

## 2021-07-16 DIAGNOSIS — I5033 Acute on chronic diastolic (congestive) heart failure: Secondary | ICD-10-CM | POA: Diagnosis present

## 2021-07-16 DIAGNOSIS — E871 Hypo-osmolality and hyponatremia: Secondary | ICD-10-CM | POA: Diagnosis present

## 2021-07-16 DIAGNOSIS — I248 Other forms of acute ischemic heart disease: Secondary | ICD-10-CM | POA: Diagnosis present

## 2021-07-16 DIAGNOSIS — J9621 Acute and chronic respiratory failure with hypoxia: Secondary | ICD-10-CM | POA: Diagnosis present

## 2021-07-16 DIAGNOSIS — I509 Heart failure, unspecified: Secondary | ICD-10-CM

## 2021-07-16 DIAGNOSIS — J9602 Acute respiratory failure with hypercapnia: Secondary | ICD-10-CM

## 2021-07-16 DIAGNOSIS — I2510 Atherosclerotic heart disease of native coronary artery without angina pectoris: Secondary | ICD-10-CM | POA: Diagnosis present

## 2021-07-16 DIAGNOSIS — J9622 Acute and chronic respiratory failure with hypercapnia: Secondary | ICD-10-CM | POA: Diagnosis present

## 2021-07-16 DIAGNOSIS — J189 Pneumonia, unspecified organism: Secondary | ICD-10-CM | POA: Diagnosis present

## 2021-07-16 DIAGNOSIS — Z79899 Other long term (current) drug therapy: Secondary | ICD-10-CM

## 2021-07-16 DIAGNOSIS — R7989 Other specified abnormal findings of blood chemistry: Secondary | ICD-10-CM | POA: Diagnosis present

## 2021-07-16 DIAGNOSIS — J441 Chronic obstructive pulmonary disease with (acute) exacerbation: Secondary | ICD-10-CM | POA: Diagnosis present

## 2021-07-16 DIAGNOSIS — Z20822 Contact with and (suspected) exposure to covid-19: Secondary | ICD-10-CM | POA: Diagnosis present

## 2021-07-16 DIAGNOSIS — R778 Other specified abnormalities of plasma proteins: Secondary | ICD-10-CM | POA: Diagnosis present

## 2021-07-16 DIAGNOSIS — I10 Essential (primary) hypertension: Secondary | ICD-10-CM | POA: Diagnosis present

## 2021-07-16 LAB — PROTIME-INR
INR: 1.1 (ref 0.8–1.2)
Prothrombin Time: 14.4 seconds (ref 11.4–15.2)

## 2021-07-16 LAB — BLOOD GAS, ARTERIAL
Acid-Base Excess: 13 mmol/L — ABNORMAL HIGH (ref 0.0–2.0)
Bicarbonate: 44.7 mmol/L — ABNORMAL HIGH (ref 20.0–28.0)
FIO2: 60
O2 Saturation: 92.9 %
Patient temperature: 37
pCO2 arterial: 93 mmHg (ref 32.0–48.0)
pH, Arterial: 7.29 — ABNORMAL LOW (ref 7.350–7.450)
pO2, Arterial: 74 mmHg — ABNORMAL LOW (ref 83.0–108.0)

## 2021-07-16 LAB — COMPREHENSIVE METABOLIC PANEL
ALT: 18 U/L (ref 0–44)
AST: 19 U/L (ref 15–41)
Albumin: 3.6 g/dL (ref 3.5–5.0)
Alkaline Phosphatase: 87 U/L (ref 38–126)
Anion gap: 9 (ref 5–15)
BUN: 22 mg/dL (ref 8–23)
CO2: 33 mmol/L — ABNORMAL HIGH (ref 22–32)
Calcium: 8.7 mg/dL — ABNORMAL LOW (ref 8.9–10.3)
Chloride: 87 mmol/L — ABNORMAL LOW (ref 98–111)
Creatinine, Ser: 0.65 mg/dL (ref 0.44–1.00)
GFR, Estimated: >60 mL/min (ref >60)
Glucose, Bld: 129 mg/dL — ABNORMAL HIGH (ref 70–99)
Potassium: 4.1 mmol/L (ref 3.5–5.1)
Sodium: 129 mmol/L — ABNORMAL LOW (ref 135–145)
Total Bilirubin: 0.7 mg/dL (ref 0.3–1.2)
Total Protein: 7.2 g/dL (ref 6.5–8.1)

## 2021-07-16 LAB — PROCALCITONIN: Procalcitonin: 0.14 ng/mL

## 2021-07-16 LAB — TROPONIN I (HIGH SENSITIVITY)
Troponin I (High Sensitivity): 89 ng/L — ABNORMAL HIGH (ref <18)
Troponin I (High Sensitivity): 93 ng/L — ABNORMAL HIGH (ref <18)

## 2021-07-16 LAB — CBC WITH DIFFERENTIAL/PLATELET
Abs Immature Granulocytes: 0.1 10*3/uL — ABNORMAL HIGH (ref 0.00–0.07)
Basophils Absolute: 0.1 10*3/uL (ref 0.0–0.1)
Basophils Relative: 0 %
Eosinophils Absolute: 0 10*3/uL (ref 0.0–0.5)
Eosinophils Relative: 0 %
HCT: 48.5 % — ABNORMAL HIGH (ref 36.0–46.0)
Hemoglobin: 16 g/dL — ABNORMAL HIGH (ref 12.0–15.0)
Immature Granulocytes: 1 %
Lymphocytes Relative: 7 %
Lymphs Abs: 1.2 10*3/uL (ref 0.7–4.0)
MCH: 29.9 pg (ref 26.0–34.0)
MCHC: 33 g/dL (ref 30.0–36.0)
MCV: 90.7 fL (ref 80.0–100.0)
Monocytes Absolute: 1.5 10*3/uL — ABNORMAL HIGH (ref 0.1–1.0)
Monocytes Relative: 9 %
Neutro Abs: 14.3 10*3/uL — ABNORMAL HIGH (ref 1.7–7.7)
Neutrophils Relative %: 83 %
Platelets: 263 10*3/uL (ref 150–400)
RBC: 5.35 MIL/uL — ABNORMAL HIGH (ref 3.87–5.11)
RDW: 14.1 % (ref 11.5–15.5)
WBC: 17.2 10*3/uL — ABNORMAL HIGH (ref 4.0–10.5)
nRBC: 0 % (ref 0.0–0.2)

## 2021-07-16 LAB — RESP PANEL BY RT-PCR (FLU A&B, COVID) ARPGX2
Influenza A by PCR: NEGATIVE
Influenza B by PCR: NEGATIVE
SARS Coronavirus 2 by RT PCR: NEGATIVE

## 2021-07-16 LAB — APTT: aPTT: 35 seconds (ref 24–36)

## 2021-07-16 LAB — LACTIC ACID, PLASMA: Lactic Acid, Venous: 1 mmol/L (ref 0.5–1.9)

## 2021-07-16 LAB — BLOOD GAS, VENOUS
Acid-Base Excess: 12.8 mmol/L — ABNORMAL HIGH (ref 0.0–2.0)
Bicarbonate: 42.4 mmol/L — ABNORMAL HIGH (ref 20.0–28.0)
O2 Saturation: 94.5 %
Patient temperature: 37
pCO2, Ven: 75 mmHg (ref 44.0–60.0)
pH, Ven: 7.36 (ref 7.250–7.430)
pO2, Ven: 76 mmHg — ABNORMAL HIGH (ref 32.0–45.0)

## 2021-07-16 LAB — BRAIN NATRIURETIC PEPTIDE: B Natriuretic Peptide: 517.7 pg/mL — ABNORMAL HIGH (ref 0.0–100.0)

## 2021-07-16 MED ORDER — LACTATED RINGERS IV SOLN: INTRAVENOUS | Status: DC

## 2021-07-16 MED ORDER — ASPIRIN EC 81 MG PO TBEC
81.0000 mg | DELAYED_RELEASE_TABLET | Freq: Every day | ORAL | Status: DC
  Administered 2021-07-16 – 2021-07-22 (×7): 81 mg via ORAL
  Filled 2021-07-16 (×7): qty 1

## 2021-07-16 MED ORDER — ASCORBIC ACID 500 MG PO TABS
500.0000 mg | ORAL_TABLET | Freq: Every day | ORAL | Status: DC
  Administered 2021-07-16 – 2021-07-22 (×7): 500 mg via ORAL
  Filled 2021-07-16 (×7): qty 1

## 2021-07-16 MED ORDER — SODIUM CHLORIDE 0.9 % IV SOLN
500.0000 mg | Freq: Once | INTRAVENOUS | Status: AC
  Administered 2021-07-16: 500 mg via INTRAVENOUS
  Filled 2021-07-16: qty 5

## 2021-07-16 MED ORDER — IOHEXOL 350 MG/ML SOLN
75.0000 mL | Freq: Once | INTRAVENOUS | Status: AC | PRN
  Administered 2021-07-17: 75 mL via INTRAVENOUS

## 2021-07-16 MED ORDER — HEPARIN BOLUS VIA INFUSION
4000.0000 [IU] | Freq: Once | INTRAVENOUS | Status: AC
  Administered 2021-07-16: 4000 [IU] via INTRAVENOUS
  Filled 2021-07-16: qty 4000

## 2021-07-16 MED ORDER — FUROSEMIDE 10 MG/ML IJ SOLN
60.0000 mg | Freq: Once | INTRAMUSCULAR | Status: AC
  Administered 2021-07-16: 60 mg via INTRAVENOUS
  Filled 2021-07-16: qty 8

## 2021-07-16 MED ORDER — ALBUTEROL SULFATE (2.5 MG/3ML) 0.083% IN NEBU: 2.5000 mg | INHALATION_SOLUTION | RESPIRATORY_TRACT | Status: DC | PRN

## 2021-07-16 MED ORDER — SODIUM CHLORIDE 0.9 % IV BOLUS
250.0000 mL | Freq: Once | INTRAVENOUS | Status: AC
  Administered 2021-07-16: 250 mL via INTRAVENOUS

## 2021-07-16 MED ORDER — MIDODRINE HCL 5 MG PO TABS
10.0000 mg | ORAL_TABLET | Freq: Once | ORAL | Status: AC
  Administered 2021-07-16: 10 mg via ORAL
  Filled 2021-07-16: qty 2

## 2021-07-16 MED ORDER — ADULT MULTIVITAMIN W/MINERALS CH
1.0000 | ORAL_TABLET | Freq: Every day | ORAL | Status: DC
  Administered 2021-07-16 – 2021-07-22 (×7): 1 via ORAL
  Filled 2021-07-16 (×7): qty 1

## 2021-07-16 MED ORDER — AZITHROMYCIN 250 MG PO TABS
250.0000 mg | ORAL_TABLET | Freq: Every day | ORAL | Status: DC
  Administered 2021-07-17 – 2021-07-20 (×4): 250 mg via ORAL
  Filled 2021-07-16 (×4): qty 1

## 2021-07-16 MED ORDER — ACETAMINOPHEN 325 MG PO TABS: 650.0000 mg | ORAL_TABLET | Freq: Four times a day (QID) | ORAL | Status: DC | PRN

## 2021-07-16 MED ORDER — METHYLPREDNISOLONE SODIUM SUCC 125 MG IJ SOLR
80.0000 mg | Freq: Every day | INTRAMUSCULAR | Status: DC
  Administered 2021-07-17 – 2021-07-20 (×4): 80 mg via INTRAVENOUS
  Filled 2021-07-16 (×4): qty 2

## 2021-07-16 MED ORDER — SODIUM CHLORIDE 0.9 % IV SOLN
2.0000 g | Freq: Once | INTRAVENOUS | Status: AC
  Administered 2021-07-16: 2 g via INTRAVENOUS
  Filled 2021-07-16: qty 2

## 2021-07-16 MED ORDER — DM-GUAIFENESIN ER 30-600 MG PO TB12: 1.0000 | ORAL_TABLET | Freq: Two times a day (BID) | ORAL | Status: DC | PRN

## 2021-07-16 MED ORDER — HEPARIN (PORCINE) 25000 UT/250ML-% IV SOLN
1200.0000 [IU]/h | INTRAVENOUS | Status: DC
  Administered 2021-07-16: 950 [IU]/h via INTRAVENOUS
  Filled 2021-07-16: qty 250

## 2021-07-16 MED ORDER — IPRATROPIUM-ALBUTEROL 0.5-2.5 (3) MG/3ML IN SOLN
3.0000 mL | Freq: Once | RESPIRATORY_TRACT | Status: AC
  Administered 2021-07-16: 3 mL via RESPIRATORY_TRACT
  Filled 2021-07-16: qty 3

## 2021-07-16 MED ORDER — METHYLPREDNISOLONE SODIUM SUCC 125 MG IJ SOLR
125.0000 mg | Freq: Once | INTRAMUSCULAR | Status: AC
Start: 2021-07-16 — End: 2021-07-16
  Administered 2021-07-16: 125 mg via INTRAVENOUS
  Filled 2021-07-16: qty 2

## 2021-07-16 MED ORDER — MIDODRINE HCL 5 MG PO TABS
10.0000 mg | ORAL_TABLET | Freq: Three times a day (TID) | ORAL | Status: DC
  Administered 2021-07-16 – 2021-07-18 (×5): 10 mg via ORAL
  Filled 2021-07-16 (×5): qty 2

## 2021-07-16 MED ORDER — ONDANSETRON HCL 4 MG/2ML IJ SOLN: 4.0000 mg | Freq: Three times a day (TID) | INTRAMUSCULAR | Status: DC | PRN

## 2021-07-16 MED ORDER — IPRATROPIUM-ALBUTEROL 0.5-2.5 (3) MG/3ML IN SOLN
3.0000 mL | RESPIRATORY_TRACT | Status: DC
  Administered 2021-07-16 – 2021-07-20 (×22): 3 mL via RESPIRATORY_TRACT
  Filled 2021-07-16 (×21): qty 3

## 2021-07-16 NOTE — ED Notes (Signed)
This RN asked charge RN to look in on pt. PT. Pressure 66/53, Charge RN placed pt. In trendelenburg.

## 2021-07-16 NOTE — ED Notes (Signed)
Dr. Clyde Lundborg notified pt's BP is 82/62, pt. Head lowered. Dr. Clyde Lundborg instructs this RN to removed bipap and see how pt's BP reacts. Pt. Removed from bipap. Placed on 4L [place]. Pt's  BP is 107/72.

## 2021-07-16 NOTE — Consult Note (Signed)
PHARMACY -  BRIEF ANTIBIOTIC NOTE   Pharmacy has received consult(s) for cefepime from an ED provider. Patient is also ordered azithromycin.  The patient's profile has been reviewed for ht/wt/allergies/indication/available labs.    One time order(s) placed for  --Cefepime 2 g IV  Further antibiotics/pharmacy consults should be ordered by admitting physician if indicated.                       Thank you, Tressie Ellis 07/16/2021  3:09 PM

## 2021-07-16 NOTE — Progress Notes (Signed)
ANTICOAGULATION CONSULT NOTE  Pharmacy Consult for IV heparin Indication: ACS/STEMI  No Known Allergies  Patient Measurements: Height: 5\' 7"  (170.2 cm) Weight: 79.4 kg (175 lb) IBW/kg (Calculated) : 61.6 Heparin Dosing Weight: 77.7 kg  Vital Signs: Temp: 98.1 F (36.7 C) (01/11 1240) Temp Source: Oral (01/11 1240) BP: 106/74 (01/11 1545) Pulse Rate: 87 (01/11 1545)  Labs: Recent Labs    07/16/21 1301  HGB 16.0*  HCT 48.5*  PLT 263  CREATININE 0.65  TROPONINIHS 89*    Estimated Creatinine Clearance: 79.1 mL/min (by C-G formula based on SCr of 0.65 mg/dL).   Medical History: Past Medical History:  Diagnosis Date   COPD (chronic obstructive pulmonary disease) (HCC)    Coronary artery disease    Hypertension     Medications:  (Not in a hospital admission)  Scheduled:  Infusions:   azithromycin     ceFEPime (MAXIPIME) IV 2 g (07/16/21 1544)   lactated ringers 150 mL/hr at 07/16/21 1510   PRN:  Anti-infectives (From admission, onward)    Start     Dose/Rate Route Frequency Ordered Stop   07/16/21 1515  ceFEPIme (MAXIPIME) 2 g in sodium chloride 0.9 % 100 mL IVPB        2 g 200 mL/hr over 30 Minutes Intravenous  Once 07/16/21 1505     07/16/21 1515  azithromycin (ZITHROMAX) 500 mg in sodium chloride 0.9 % 250 mL IVPB        500 mg 250 mL/hr over 60 Minutes Intravenous  Once 07/16/21 1505         No PTA anticoagulation per my chart review.  Assessment: 60YOF presenting with shortness of breath. PMH includes COPD and CHF. Troponins 89 (x1), BNP also elevated and on 4LO2.   Hgb and plts stable and WNL. aPTT and INR WNL. No mention of bleeding in notes.  Will proceed with heparin.  Goal of Therapy:  Heparin level 0.3-0.7 units/ml Monitor platelets by anticoagulation protocol: Yes   Plan:  Give 4,000 units bolus x 1 Start heparin infusion at 950 units/hr Anti-Xa level in 6 hours from start of infusion. (Ordered for 1/12 @0100 ) Daily CBC. Monitor  for s/sx bleeding.   3/12, PharmD Pharmacy Resident  07/16/2021 4:06 PM

## 2021-07-16 NOTE — ED Notes (Addendum)
Dr. Roxan Hockey at bedside, notified of pt's previous BP of 86/55. Will continue to monitor.

## 2021-07-16 NOTE — ED Notes (Signed)
[employer]

## 2021-07-16 NOTE — ED Provider Notes (Signed)
[facility] Provider Note    Event Date/Time   First MD Initiated Contact with Patient 07/16/21 1302     (approximate)   History   Shortness of Breath   HPI  Virginia Norman is a 61 y.o. female with a history of COPD as well as CHF presents to the ER for evaluation of worsening shortness of breath cough.  Has had some chills but no measured fevers.  Does not wear home oxygen arrival today found to be hypoxic to the 80s.  States that she was checking her pulse oximeter at home and was noting that was in the 80s in the morning.  She denies any pleuritic chest pain.  Has had some nausea no vomiting.  No diarrhea.  No recent antibiotics.  She does not currently smoke.     Physical Exam   Triage Vital Signs: ED Triage Vitals  Enc Vitals Group     BP 07/16/21 1240 105/67     Pulse Rate 07/16/21 1240 (!) 105     Resp 07/16/21 1240 (!) 24     Temp 07/16/21 1240 98.1 F (36.7 C)     Temp Source 07/16/21 1240 Oral     SpO2 07/16/21 1240 (!) 58 %     Weight 07/16/21 1249 175 lb (79.4 kg)     Height 07/16/21 1249 5\' 7"  (1.702 m)     Head Circumference --      Peak Flow --      Pain Score 07/16/21 1248 0     Pain Loc --      Pain Edu? --      Excl. in [place]? --     Most recent vital signs: Vitals:   07/16/21 1535 07/16/21 1545  BP: (!) 86/55 106/74  Pulse: 83 87  Resp: (!) 23 17  Temp:    SpO2: 93% 95%     Constitutional: Alert  Eyes: Conjunctivae are normal.  Head: Atraumatic. Nose: No congestion/rhinnorhea. Mouth/Throat: Mucous membranes are moist.   Neck: Painless ROM.  Cardiovascular:   Good peripheral circulation. Respiratory:  mild tachypnea with scattered expiratory wheeze Gastrointestinal: Soft and nontender.  Musculoskeletal:  no deformity Neurologic:  MAE spontaneously. No gross focal neurologic deficits are appreciated.  Skin:  Skin is warm, dry and intact. No rash noted. Psychiatric: Mood and affect are normal. Speech and behavior  are normal.    ED Results / Procedures / Treatments   Labs (all labs ordered are listed, but only abnormal results are displayed) Labs Reviewed  CBC WITH DIFFERENTIAL/PLATELET - Abnormal; Notable for the following components:      Result Value   WBC 17.2 (*)    RBC 5.35 (*)    Hemoglobin 16.0 (*)    HCT 48.5 (*)    Neutro Abs 14.3 (*)    Monocytes Absolute 1.5 (*)    Abs Immature Granulocytes 0.10 (*)    All other components within normal limits  COMPREHENSIVE METABOLIC PANEL - Abnormal; Notable for the following components:   Sodium 129 (*)    Chloride 87 (*)    CO2 33 (*)    Glucose, Bld 129 (*)    Calcium 8.7 (*)    All other components within normal limits  BLOOD GAS, VENOUS - Abnormal; Notable for the following components:   pCO2, Ven 75 (*)    pO2, Ven 76.0 (*)    Bicarbonate 42.4 (*)    Acid-Base Excess 12.8 (*)    All other components within  normal limits  BRAIN NATRIURETIC PEPTIDE - Abnormal; Notable for the following components:   B Natriuretic Peptide 517.7 (*)    All other components within normal limits  TROPONIN I (HIGH SENSITIVITY) - Abnormal; Notable for the following components:   Troponin I (High Sensitivity) 89 (*)    All other components within normal limits  RESP PANEL BY RT-PCR (FLU A&B, COVID) ARPGX2  CULTURE, BLOOD (ROUTINE X 2)  CULTURE, BLOOD (ROUTINE X 2)  LACTIC ACID, PLASMA  PROCALCITONIN  TROPONIN I (HIGH SENSITIVITY)     EKG  ED ECG REPORT I, Merlyn Lot, the attending physician, personally viewed and interpreted this ECG.   Date: 07/16/2021  EKG Time: 12:51  Rate: 100  Rhythm: sinus  Axis: normal  Intervals:normal qt  ST&T Change: nonspecific st abn, no stemi    RADIOLOGY Please see ED Course for my review and interpretation.  I personally reviewed all radiographic images ordered to evaluate for the above acute complaints and reviewed radiology reports and findings.  These findings were personally discussed with the  patient.  Please see medical record for radiology report.    PROCEDURES:  Critical Care performed: Yes, see critical care procedure note(s)  .1-3 Lead EKG Interpretation Performed by: Merlyn Lot, MD Authorized by: Merlyn Lot, MD     Interpretation: normal     ECG rate:  95   ECG rate assessment: tachycardic     Rhythm: sinus rhythm   .Critical Care Performed by: Merlyn Lot, MD Authorized by: Merlyn Lot, MD   Critical care provider statement:    Critical care time (minutes):  33   Critical care was necessary to treat or prevent imminent or life-threatening deterioration of the following conditions:  Respiratory failure   Critical care was time spent personally by me on the following activities:  Ordering and performing treatments and interventions, ordering and review of laboratory studies, ordering and review of radiographic studies, pulse oximetry, re-evaluation of patient's condition, review of old charts, obtaining history from patient or surrogate, examination of patient, evaluation of patient's response to treatment, discussions with primary provider, discussions with consultants and development of treatment plan with patient or surrogate   MEDICATIONS ORDERED IN ED: Medications  lactated ringers infusion ( Intravenous New Bag/Given 07/16/21 1510)  ceFEPIme (MAXIPIME) 2 g in sodium chloride 0.9 % 100 mL IVPB (2 g Intravenous New Bag/Given 07/16/21 1544)  azithromycin (ZITHROMAX) 500 mg in sodium chloride 0.9 % 250 mL IVPB (has no administration in time range)  ipratropium-albuterol (DUONEB) 0.5-2.5 (3) MG/3ML nebulizer solution 3 mL (3 mLs Nebulization Given 07/16/21 1402)  ipratropium-albuterol (DUONEB) 0.5-2.5 (3) MG/3ML nebulizer solution 3 mL (3 mLs Nebulization Given 07/16/21 1402)  furosemide (LASIX) injection 60 mg (60 mg Intravenous Given 07/16/21 1514)  methylPREDNISolone sodium succinate (SOLU-MEDROL) 125 mg/2 mL injection 125 mg (125 mg  Intravenous Given 07/16/21 1511)     IMPRESSION / MDM / [place] / ED COURSE  I reviewed the triage vital signs and the nursing notes.                              Differential diagnosis includes, but is not limited to, Asthma, copd, CHF, pna, ptx, malignancy, Pe, anemia  Presenting with shortness of breath clinically is wheezing throughout chest x-ray with ordered for the but differential.  No fever no significant tachycardia otherwise well perfused she is tachypneic however and with acute hypoxia requiring supplemental oxygen.  Does not  wear home oxygen.  Patient given nebulizers.  Also concern for possible CHF.  She not complaining of any pleuritic chest pain at this time I have a lower suspicion for PE.  Possible pneumonia.  Clinical Course as of 07/16/21 1606  Wed Jul 16, 2021  1337 Chest x-ray on my review appears consistent with pneumonia and pneumothorax.  radiologist review pending. [PR]  [id number] Lactate is normal.  This is more likely COPD versus CHF we will give IV Lasix. [PR]  1502 Went to reassess patient and she is somewhat drowsy she is able to be awoken but also feels like she is becoming more short of breath therefore will place on BiPAP.  Will require admission. [PR]  1552 Troponin is elevated.  Will heparinize as she does have findings of CHF the may be more likely demand ischemia in the setting of her hypoxia and CHF.  Hospitalist consulted for admission [PR]    Clinical Course User Index [PR] Merlyn Lot, MD     FINAL CLINICAL IMPRESSION(S) / ED DIAGNOSES   Final diagnoses:  Acute respiratory failure with hypoxia and hypercapnia (HCC)  Congestive heart failure, unspecified HF chronicity, unspecified heart failure type ([place])  Chronic obstructive pulmonary disease with acute exacerbation ([place])     Rx / DC Orders   ED Discharge Orders     None        Note:  This document was prepared using Dragon voice recognition software and may include  unintentional dictation errors.    Merlyn Lot, MD 07/16/21 1606

## 2021-07-16 NOTE — ED Notes (Signed)
[place], NP at bedside.  

## 2021-07-16 NOTE — ED Notes (Signed)
Pt's granddaughter at bedside.

## 2021-07-16 NOTE — ED Notes (Signed)
Pt. States she has diagnosis of pre-COPD from last year, but has been getting better in recent months. Pt. States she has been SOB, fatigue, and low appetite x6 days, and cough x3 days.

## 2021-07-16 NOTE — ED Notes (Signed)
This RN notified Orpha Bur, NP that pt's BP is still low, even with pt. In trendelenburg. Awaiting orders/instructions.

## 2021-07-16 NOTE — ED Notes (Signed)
RT at bedside to apply bipap. This RN updated pt. On POC, need for bipap, and lasix. Purewick in place.

## 2021-07-16 NOTE — H&P (Signed)
History and Physical    Virginia Norman [id number] DOB: July 04, 1961 DOA: 07/16/2021  Referring MD/NP/PA:   PCP: [facility]   Patient coming from:  The patient is coming from home.   Chief Complaint: SOB  HPI: Virginia Norman is a 61 y.o. female with medical history significant of COPD not on oxygen normally, hypertension, CAD, dCHF, former smoker, who presents with shortness breath.  Patient states that she has SOB for more than 3 days, which has been progressively worsening.  Patient has productive cough with dark-colored sputum production.  She also has generalized weakness, poor appetite, decreased oral intake.  Denies fever or chills.  Patient states she had some mild left-sided sharp chest pain earlier, which has resolved.  Currently no active chest pain.  No nausea, vomiting, diarrhea or abdominal pain.  No symptoms of UTI.  No recent fall or head injury.  No dark stool or rectal bleeding.  Per report, patient had oxygen desaturating to 80s in the morning. Patient was found to have severe respiratory distress and cannot speak in full sentence.  BiPAP was started.  Patient had 1 episode of hypotension with blood pressure 86/55, which might not be accurate measurement per ED physician.  Later on, nurse reported that patient had another episode of hypotension with blood pressure 82/65, which improved to 107/72 after removing BiPAP.   ED Course: pt was found to have WBC 17.2, troponin level 89 --> 93, BNP 517, negative COVID PCR, procalcitonin 0.14, lactic acid 1.0, sodium 129, renal function okay, temperature 98.1, heart rate 105, RR 44.  Chest x-ray showed mild pulmonary edema and cardiomegaly.  VBG with pH 7.36, CO2 75, O2 76.  Patient is admitted to progressive bed as inpatient.  Review of Systems:   General: no fevers, chills, no body weight gain, has poor appetite, has fatigue HEENT: no blurry vision, hearing changes or sore throat Respiratory: has dyspnea,  coughing, wheezing CV: no chest pain, no palpitations GI: no nausea, vomiting, abdominal pain, diarrhea, constipation GU: no dysuria, burning on urination, increased urinary frequency, hematuria  Ext: has trace leg edema Neuro: no unilateral weakness, numbness, or tingling, no vision change or hearing loss Skin: no rash, no skin tear. MSK: No muscle spasm, no deformity, no limitation of range of movement in spin Heme: No easy bruising.  Travel history: No recent long distant travel.  Allergy: No Known Allergies  Past Medical History:  Diagnosis Date   COPD (chronic obstructive pulmonary disease) (HCC)    Coronary artery disease    Hypertension     Past Surgical History:  Procedure Laterality Date   ABDOMINAL HYSTERECTOMY      Social History:  reports that she has quit smoking. Her smoking use included cigarettes. She has a 33.00 pack-year smoking history. She has never used smokeless tobacco. She reports that she does not drink alcohol and does not use drugs.  Family History:  Family History  Problem Relation Age of Onset   Hypertension Mother    Heart disease Mother    Stomach cancer Father    Lupus Sister      Prior to Admission medications   Medication Sig Start Date End Date Taking? Authorizing Provider  albuterol (VENTOLIN HFA) 108 (90 Base) MCG/ACT inhaler Inhale 1-2 puffs into the lungs every 6 (six) hours as needed for wheezing or shortness of breath.   Yes [provider]  aspirin EC 81 MG tablet Take 81 mg by mouth daily.   Yes [provider]  dextromethorphan-guaiFENesin (MUCINEX DM) 30-600 MG 12hr tablet Take 1 tablet by mouth 2 (two) times daily as needed for cough.   Yes [provider]  hydrochlorothiazide (HYDRODIURIL) 25 MG tablet Take 25 mg by mouth daily.   Yes [provider]  lisinopril (ZESTRIL) 5 MG tablet Take 5 mg by mouth daily.   Yes [provider]  Multiple Vitamins-Minerals (MULTIVITAMIN WITH  MINERALS) tablet Take 1 tablet by mouth daily.   Yes [provider]  tiotropium (SPIRIVA) 18 MCG inhalation capsule Place 18 mcg into inhaler and inhale daily.   Yes [provider]  vitamin C (ASCORBIC ACID) 500 MG tablet Take 500 mg by mouth daily.   Yes [provider]    Physical Exam: Vitals:   07/16/21 1730 07/16/21 1745 07/16/21 1800 07/16/21 1815  BP: 107/74 98/67 115/70 97/60  Pulse: 79 90 90 83  Resp: (!) 35 17 (!) 27 18  Temp:      TempSrc:      SpO2: 95% 95% 91% (!) 87%  Weight:      Height:       General: Not in acute distress HEENT:       Eyes: PERRL, EOMI, no scleral icterus.       ENT: No discharge from the ears and nose, no pharynx injection, no tonsillar enlargement.        Neck: has positive JVD, no bruit, no mass felt. Heme: No neck lymph node enlargement. Cardiac: S1/S2, RRR, No murmurs, No gallops or rubs. Respiratory: has mild wheezing bilaterally GI: Soft, nondistended, nontender, no rebound pain, no organomegaly, BS present. GU: No hematuria Ext: has trace pitting leg edema bilaterally. 1+DP/PT pulse bilaterally. Musculoskeletal: No joint deformities, No joint redness or warmth, no limitation of ROM in spin. Skin: No rashes.  Neuro: Alert, oriented X3, cranial nerves II-XII grossly intact, moves all extremities normally. Psych: Patient is not psychotic, no suicidal or hemocidal ideation.  Labs on Admission: I have personally reviewed following labs and imaging studies  CBC: Recent Labs  Lab 07/16/21 1301  WBC 17.2*  NEUTROABS 14.3*  HGB 16.0*  HCT 48.5*  MCV 90.7  PLT 99991111   Basic Metabolic Panel: Recent Labs  Lab 07/16/21 1301  NA 129*  K 4.1  CL 87*  CO2 33*  GLUCOSE 129*  BUN 22  CREATININE 0.65  CALCIUM 8.7*   GFR: Estimated Creatinine Clearance: 79.1 mL/min (by C-G formula based on SCr of 0.65 mg/dL). Liver Function Tests: Recent Labs  Lab 07/16/21 1301  AST 19  ALT 18  ALKPHOS 87  BILITOT 0.7   PROT 7.2  ALBUMIN 3.6   No results for input(s): LIPASE, AMYLASE in the last 168 hours. No results for input(s): AMMONIA in the last 168 hours. Coagulation Profile: Recent Labs  Lab 07/16/21 1301  INR 1.1   Cardiac Enzymes: No results for input(s): CKTOTAL, CKMB, CKMBINDEX, TROPONINI in the last 168 hours. BNP (last 3 results) No results for input(s): PROBNP in the last 8760 hours. HbA1C: No results for input(s): HGBA1C in the last 72 hours. CBG: No results for input(s): GLUCAP in the last 168 hours. Lipid Profile: No results for input(s): CHOL, HDL, LDLCALC, TRIG, CHOLHDL, LDLDIRECT in the last 72 hours. Thyroid Function Tests: No results for input(s): TSH, T4TOTAL, FREET4, T3FREE, THYROIDAB in the last 72 hours. Anemia Panel: No results for input(s): VITAMINB12, FOLATE, FERRITIN, TIBC, IRON, RETICCTPCT in the last 72 hours. Urine analysis: No results found for: COLORURINE, APPEARANCEUR, [place],  PHURINE, GLUCOSEU, HGBUR, BILIRUBINUR, KETONESUR, PROTEINUR, UROBILINOGEN, NITRITE, LEUKOCYTESUR Sepsis Labs: @LABRCNTIP (procalcitonin:4,lacticidven:4) ) Recent Results (from the past 240 hour(s))  Resp Panel by RT-PCR (Flu A&B, Covid) Nasopharyngeal Swab     Status: None   Collection Time: 07/16/21  2:05 PM   Specimen: Nasopharyngeal Swab; Nasopharyngeal(NP) swabs in vial transport medium  Result Value Ref Range Status   SARS Coronavirus 2 by RT PCR NEGATIVE NEGATIVE Final    Comment: (NOTE) SARS-CoV-2 target nucleic acids are NOT DETECTED.  The SARS-CoV-2 RNA is generally detectable in upper respiratory specimens during the acute phase of infection. The lowest concentration of SARS-CoV-2 viral copies this assay can detect is 138 copies/mL. A negative result does not preclude SARS-Cov-2 infection and should not be used as the sole basis for treatment or other patient management decisions. A negative result may occur with  improper specimen collection/handling, submission of  specimen other than nasopharyngeal swab, presence of viral mutation(s) within the areas targeted by this assay, and inadequate number of viral copies(<138 copies/mL). A negative result must be combined with clinical observations, patient history, and epidemiological information. The expected result is Negative.  Fact Sheet for Patients:  EntrepreneurPulse.com.au  Fact Sheet for Healthcare Providers:  IncredibleEmployment.be  This test is no t yet approved or cleared by the [place] FDA and  has been authorized for detection and/or diagnosis of SARS-CoV-2 by FDA under an Emergency Use Authorization (EUA). This EUA will remain  in effect (meaning this test can be used) for the duration of the COVID-19 declaration under Section 564(b)(1) of the Act, 21 U.S.C.section 360bbb-3(b)(1), unless the authorization is terminated  or revoked sooner.       Influenza A by PCR NEGATIVE NEGATIVE Final   Influenza B by PCR NEGATIVE NEGATIVE Final    Comment: (NOTE) The Xpert Xpress SARS-CoV-2/FLU/RSV plus assay is intended as an aid in the diagnosis of influenza from Nasopharyngeal swab specimens and should not be used as a sole basis for treatment. Nasal washings and aspirates are unacceptable for Xpert Xpress SARS-CoV-2/FLU/RSV testing.  Fact Sheet for Patients: EntrepreneurPulse.com.au  Fact Sheet for Healthcare Providers: IncredibleEmployment.be  This test is not yet approved or cleared by the [place] FDA and has been authorized for detection and/or diagnosis of SARS-CoV-2 by FDA under an Emergency Use Authorization (EUA). This EUA will remain in effect (meaning this test can be used) for the duration of the COVID-19 declaration under Section 564(b)(1) of the Act, 21 U.S.C. section 360bbb-3(b)(1), unless the authorization is terminated or revoked.  Performed at [facility], [address], [place], [place] [address]      Radiological Exams on Admission: DG Chest 2 View  Result Date: 07/16/2021 CLINICAL DATA:  61 year old female with shortness of breath. EXAM: CHEST - 2 VIEW COMPARISON:  10/02/2019 FINDINGS: The mediastinal contours are within normal limits. Similar appearing moderate cardiomegaly. Atherosclerotic calcifications of the aortic arch. Hazy, peripheral and lower lobe predominant pulmonary opacities. Trace bilateral pleural effusions. No pneumothorax. No acute osseous abnormality. Diffuse osteopenia. IMPRESSION: 1. Mild pulmonary edema and trace bilateral pleural effusions. 2. Similar appearing moderate cardiomegaly. 3.  Aortic Atherosclerosis (ICD10-I70.0). Electronically Signed   By: Ruthann Cancer M.D.   On: 07/16/2021 13:47     EKG: I have personally reviewed.  Sinus rhythm, QTC 440, bilateral atrial enlargement  Assessment/Plan Principal Problem:   Acute respiratory failure with hypoxia and hypercapnia (HCC) Active Problems:   Benign essential HTN   CAD (coronary artery disease)   COPD exacerbation (HCC)  Acute on chronic diastolic CHF (congestive heart failure) (HCC)   Elevated troponin   Hyponatremia   Sepsis ([place])   Acute respiratory failure with hypoxia and hypercapnia ([place]): Likely due to combination of COPD and CHF exacerbation.  Patient has wheezing on auscultation, indicating COPD exacerbation.  Patient has trace leg edema, elevated BNP 517, positive JVD, pulm edema chest x-ray, clinically consistent with CHF exacerbation.  Patient had chest pain, will need to rule out PE.  -Admitted to progressive bed as inpatient -Wean off of BiPAP -Bronchodilators -IV diuretics, Lasix for CHF exacerbation -Follow-up CT angiogram to rule out PE  Acute on chronic diastolic CHF (congestive heart failure): 2D echo on 10/03/2019 showed EF seated at 65% with grade 1 diastolic dysfunction. -Lasix 60 mg was given in ED. Will hold off further diuresis due to  hypotension and hyponatremia -2d echo -Daily weights -strict I/O's -Fluid restriction -Obtain REDs Vest reading  Sepsis due to COPD exacerbation: She meets criteria for sepsis with WBC 17.2, tachypnea with RR 44, tachycardia with HR 105.  Lactic acid is normal 1.0.  Patient had 2 episode of hypotension, after removal of BiPAP, blood pressure improved to 107/72 -Bronchodilators -Solu-Medrol 40 mg IV bid -Oral azithromycin to 50 mg daily (patient received 1 dose of IV cefepime and azithromycin in ED) -Mucinex for cough  -Incentive spirometry -Follow up blood culture x2 -sputum culture -Nasal cannula oxygen as needed to maintain O2 saturation 93% or greater when pt is off BiPAP -Midodrine 10 mg 3 times daily  Benign essential HTN -Hold home Bp meds due to hypotensio  CAD (coronary artery disease) and elevated troponin: Troponin level 89 --> 93.  Possibly due to demand ischemia.  IV heparin was started in ED.  Cannot completely rule out non-STEMI.  Since we also need to rule out PE, will continue IV heparin now. -Continue IV heparin -Trend troponin -Check A1c, FLP -Aspirin  Hyponatremia: Likely due to poor oral intake and dehydration and continuation of HCTZ - Will check urine sodium, urine osmolality, serum osmolality. - Fluid restriction - IVF: Will not give IV fluid due to CHF exacerbation - f/u by BMP q8h - avoid over correction too fast due to risk of central pontine myelinolys     DVT ppx: on IV Heparin    Code Status: Full code Family Communication:    Yes, patient's daughter   at bed side.    Disposition Plan:  Anticipate discharge back to previous environment Consults called:  none Admission status and Level of care: Progressive:  as inpt      Status is: Inpatient  Remains inpatient appropriate because: Patient has multiple comorbidities, now presents with acute on chronic respiratory failure with hypoxia and hypercapnia, due to [facility] and COPD exacerbation.   Patient also has sepsis, elevated troponin and hyponatremia.  Her presentation is highly complicated.  Patient is at high risk of deteriorating.  Will need to be treated in hospital for at least 2 days.          Date of Service 07/16/2021    Ivor Costa [facility]   If 7PM-7AM, please contact night-coverage [email or web address] 07/16/2021, 6:48 PM

## 2021-07-16 NOTE — ED Notes (Signed)
Verified Heparin with Zollie Scale RN

## 2021-07-16 NOTE — ED Notes (Signed)
ED Provider at bedside. 

## 2021-07-16 NOTE — ED Notes (Signed)
Notified dr. Roney Marion of pt's BP, and O2 sats. Dr. Roney Marion states put pt. On HFNC and she will order something for pt's BP. This RN called RT to place pt. On HFNC. Will continue to monitor.

## 2021-07-16 NOTE — ED Notes (Signed)
Bishop Limbo, NP at bedside. Aware of pt's VS. Will notify this RN of the plan.

## 2021-07-16 NOTE — ED Notes (Signed)
Pt. Sats 87% on RA, pt. Placed back on bipap to achieve O2 sat of >92%

## 2021-07-16 NOTE — Progress Notes (Signed)
CODE SEPSIS - PHARMACY COMMUNICATION  **Broad Spectrum Antibiotics should be administered within 1 hour of Sepsis diagnosis**  Time Code Sepsis Called/Page Received: 1505  Antibiotics Ordered: Cefepime + azithromycin  Time of 1st antibiotic administration: 1544  Additional action taken by pharmacy: N/A  Tressie Ellis 07/16/2021  3:10 PM

## 2021-07-16 NOTE — ED Notes (Addendum)
Pt's BP 87/72, bipap removed. Paged dr. Otilio Miu.

## 2021-07-16 NOTE — ED Triage Notes (Signed)
PT from PCP office with reports of sob. Pt reports she has COPD and has been feeling more short of breath for the past couple days. PT denies pain.

## 2021-07-16 NOTE — ED Notes (Signed)
Critical VBG  results called by RT CO2 75 Bicarb 42 pH 7.36  Dr. Roxan Hockey notified by this RN at [phone]

## 2021-07-16 NOTE — Progress Notes (Signed)
Name: Virginia Norman MRN: [id number] DOB: 06-28-1961     Subjective: Called to bedside for hypotension and labile blood pressures, nursing reports they were told to take patient off biPAP when she becomes hypotensive.    Objective:  Patient admitted with acute respiratory failure with hypoxia and hypercapnia secondary to COPD exacerbation and CHF exacerbation.  Echo in March 2021 showed EF of 65% with grade 1 diastolic dysfunction.  In the ED patient met criteria for sepsis with leukocytosis of 17.2 tachypneic with a rate of 44 and tachycardic to heart rate of 105.  At that time lactic acid was normal at 1.0.   Today's Vitals   07/17/21 0220 07/17/21 0300 07/17/21 0415 07/17/21 0515  BP: 104/66 (!) 88/65 (!) 90/47 (!) 97/58  Pulse: 75 75 70 72  Resp: (!) 22 18 (!) 36 (!) 26  Temp:      TempSrc:      SpO2: 94% 93% 90% 92%  Weight:      Height:      PainSc:         Results for orders placed or performed during the hospital encounter of 07/16/21 (from the past 24 hour(s))  CBC with Differential     Status: Abnormal   Collection Time: 07/16/21  1:01 PM  Result Value Ref Range   WBC 17.2 (H) 4.0 - 10.5 K/uL   RBC 5.35 (H) 3.87 - 5.11 MIL/uL   Hemoglobin 16.0 (H) 12.0 - 15.0 g/dL   HCT 48.5 (H) 36.0 - 46.0 %   MCV 90.7 80.0 - 100.0 fL   MCH 29.9 26.0 - 34.0 pg   MCHC 33.0 30.0 - 36.0 g/dL   RDW 14.1 11.5 - 15.5 %   Platelets 263 150 - 400 K/uL   nRBC 0.0 0.0 - 0.2 %   Neutrophils Relative % 83 %   Neutro Abs 14.3 (H) 1.7 - 7.7 K/uL   Lymphocytes Relative 7 %   Lymphs Abs 1.2 0.7 - 4.0 K/uL   Monocytes Relative 9 %   Monocytes Absolute 1.5 (H) 0.1 - 1.0 K/uL   Eosinophils Relative 0 %   Eosinophils Absolute 0.0 0.0 - 0.5 K/uL   Basophils Relative 0 %   Basophils Absolute 0.1 0.0 - 0.1 K/uL   Immature Granulocytes 1 %   Abs Immature Granulocytes 0.10 (H) 0.00 - 0.07 K/uL  Comprehensive metabolic panel     Status: Abnormal   Collection Time: 07/16/21  1:01 PM  Result  Value Ref Range   Sodium 129 (L) 135 - 145 mmol/L   Potassium 4.1 3.5 - 5.1 mmol/L   Chloride 87 (L) 98 - 111 mmol/L   CO2 33 (H) 22 - 32 mmol/L   Glucose, Bld 129 (H) 70 - 99 mg/dL   BUN 22 8 - 23 mg/dL   Creatinine, Ser 0.65 0.44 - 1.00 mg/dL   Calcium 8.7 (L) 8.9 - 10.3 mg/dL   Total Protein 7.2 6.5 - 8.1 g/dL   Albumin 3.6 3.5 - 5.0 g/dL   AST 19 15 - 41 U/L   ALT 18 0 - 44 U/L   Alkaline Phosphatase 87 38 - 126 U/L   Total Bilirubin 0.7 0.3 - 1.2 mg/dL   GFR, Estimated >60 >60 mL/min   Anion gap 9 5 - 15  Brain natriuretic peptide     Status: Abnormal   Collection Time: 07/16/21  1:01 PM  Result Value Ref Range   B Natriuretic Peptide 517.7 (H) 0.0 - 100.0  pg/mL  Troponin I (High Sensitivity)     Status: Abnormal   Collection Time: 07/16/21  1:01 PM  Result Value Ref Range   Troponin I (High Sensitivity) 89 (H) <18 ng/L  APTT     Status: None   Collection Time: 07/16/21  1:01 PM  Result Value Ref Range   aPTT 35 24 - 36 seconds  Protime-INR     Status: None   Collection Time: 07/16/21  1:01 PM  Result Value Ref Range   Prothrombin Time 14.4 11.4 - 15.2 seconds   INR 1.1 0.8 - 1.2  Blood culture (routine x 2)     Status: None (Preliminary result)   Collection Time: 07/16/21  1:33 PM   Specimen: BLOOD  Result Value Ref Range   Specimen Description BLOOD BLOOD RIGHT FOREARM    Special Requests      BOTTLES DRAWN AEROBIC AND ANAEROBIC Blood Culture adequate volume   Culture      NO GROWTH < 24 HOURS Performed at [facility], [address] [place], [place] [address]    Report Status PENDING   Blood gas, venous     Status: Abnormal   Collection Time: 07/16/21  1:37 PM  Result Value Ref Range   pH, Ven 7.36 7.250 - 7.430   pCO2, Ven 75 (HH) 44.0 - 60.0 mmHg   pO2, Ven 76.0 (H) 32.0 - 45.0 mmHg   Bicarbonate 42.4 (H) 20.0 - 28.0 mmol/L   Acid-Base Excess 12.8 (H) 0.0 - 2.0 mmol/L   O2 Saturation 94.5 %   Patient temperature 37.0    Collection site  VEIN    Sample type VENIPUNCTURE   Blood culture (routine x 2)     Status: None (Preliminary result)   Collection Time: 07/16/21  1:38 PM   Specimen: BLOOD  Result Value Ref Range   Specimen Description BLOOD BLOOD LEFT FOREARM    Special Requests      BOTTLES DRAWN AEROBIC AND ANAEROBIC Blood Culture adequate volume   Culture      NO GROWTH < 24 HOURS Performed at [facility], [address] [place],  [address]    Report Status PENDING   Resp Panel by RT-PCR (Flu A&B, Covid) Nasopharyngeal Swab     Status: None   Collection Time: 07/16/21  2:05 PM   Specimen: Nasopharyngeal Swab; Nasopharyngeal(NP) swabs in vial transport medium  Result Value Ref Range   SARS Coronavirus 2 by RT PCR NEGATIVE NEGATIVE   Influenza A by PCR NEGATIVE NEGATIVE   Influenza B by PCR NEGATIVE NEGATIVE  Lactic acid, plasma     Status: None   Collection Time: 07/16/21  2:05 PM  Result Value Ref Range   Lactic Acid, Venous 1.0 0.5 - 1.9 mmol/L  Procalcitonin - Baseline     Status: None   Collection Time: 07/16/21  3:52 PM  Result Value Ref Range   Procalcitonin 0.14 ng/mL  Troponin I (High Sensitivity)     Status: Abnormal   Collection Time: 07/16/21  3:52 PM  Result Value Ref Range   Troponin I (High Sensitivity) 93 (H) <18 ng/L  Blood gas, arterial     Status: Abnormal   Collection Time: 07/16/21  9:49 PM  Result Value Ref Range   FIO2 60.00    pH, Arterial 7.29 (L) 7.350 - 7.450   pCO2 arterial 93 (HH) 32.0 - 48.0 mmHg   pO2, Arterial 74 (L) 83.0 - 108.0 mmHg   Bicarbonate 44.7 (H) 20.0 -  28.0 mmol/L   Acid-Base Excess 13.0 (H) 0.0 - 2.0 mmol/L   O2 Saturation 92.9 %   Patient temperature 37.0    Collection site RIGHT RADIAL    Sample type ARTERIAL DRAW    Allens test (pass/fail) PASS PASS  Basic metabolic panel     Status: Abnormal   Collection Time: 07/17/21  1:03 AM  Result Value Ref Range   Sodium 127 (L) 135 - 145 mmol/L   Potassium 4.1 3.5 - 5.1 mmol/L   Chloride  86 (L) 98 - 111 mmol/L   CO2 35 (H) 22 - 32 mmol/L   Glucose, Bld 131 (H) 70 - 99 mg/dL   BUN 36 (H) 8 - 23 mg/dL   Creatinine, Ser 0.98 0.44 - 1.00 mg/dL   Calcium 8.2 (L) 8.9 - 10.3 mg/dL   GFR, Estimated >60 >60 mL/min   Anion gap 6 5 - 15  Heparin level (unfractionated)     Status: Abnormal   Collection Time: 07/17/21  1:03 AM  Result Value Ref Range   Heparin Unfractionated <0.10 (L) 0.30 - 0.70 IU/mL  Blood gas, arterial     Status: Abnormal   Collection Time: 07/17/21  1:34 AM  Result Value Ref Range   FIO2 45.00    Delivery systems HI FLOW NASAL CANNULA    pH, Arterial 7.30 (L) 7.350 - 7.450   pCO2 arterial 86 (HH) 32.0 - 48.0 mmHg   pO2, Arterial 62 (L) 83.0 - 108.0 mmHg   Bicarbonate 42.3 (H) 20.0 - 28.0 mmol/L   Acid-Base Excess 11.6 (H) 0.0 - 2.0 mmol/L   O2 Saturation 88.8 %   Patient temperature 37.0    Collection site RIGHT RADIAL    Sample type ARTERIAL DRAW    Allens test (pass/fail) PASS PASS  Blood gas, venous     Status: Abnormal   Collection Time: 07/17/21  5:00 AM  Result Value Ref Range   FIO2 45.00    Delivery systems HI FLOW NASAL CANNULA    pH, Ven 7.32 7.250 - 7.430   pCO2, Ven 81 (HH) 44.0 - 60.0 mmHg   pO2, Ven 62.0 (H) 32.0 - 45.0 mmHg   Bicarbonate 41.7 (H) 20.0 - 28.0 mmol/L   Acid-Base Excess 11.7 (H) 0.0 - 2.0 mmol/L   O2 Saturation 89.3 %   Patient temperature 37.0    Collection site VEIN    Sample type VENOUS     DG Chest 2 View  Result Date: 07/16/2021 CLINICAL DATA:  61 year old female with shortness of breath. EXAM: CHEST - 2 VIEW COMPARISON:  10/02/2019 FINDINGS: The mediastinal contours are within normal limits. Similar appearing moderate cardiomegaly. Atherosclerotic calcifications of the aortic arch. Hazy, peripheral and lower lobe predominant pulmonary opacities. Trace bilateral pleural effusions. No pneumothorax. No acute osseous abnormality. Diffuse osteopenia. IMPRESSION: 1. Mild pulmonary edema and trace bilateral pleural  effusions. 2. Similar appearing moderate cardiomegaly. 3.  Aortic Atherosclerosis (ICD10-I70.0). Electronically Signed   By: Ruthann Cancer M.D.   On: 07/16/2021 13:47   CT Angio Chest Pulmonary Embolism (PE) W or WO Contrast  Result Date: 07/17/2021 CLINICAL DATA:  Pulmonary embolism (PE) suspected, high prob. Shortness of breath, cough EXAM: CT ANGIOGRAPHY CHEST WITH CONTRAST TECHNIQUE: Multidetector CT imaging of the chest was performed using the standard protocol during bolus administration of intravenous contrast. Multiplanar CT image reconstructions and MIPs were obtained to evaluate the vascular anatomy. RADIATION DOSE REDUCTION: This exam was performed according to the departmental dose-optimization program which includes automated exposure  control, adjustment of the mA and/or kV according to patient size and/or use of iterative reconstruction technique. CONTRAST:  60m OMNIPAQUE IOHEXOL 350 MG/ML SOLN COMPARISON:  06/13/2020 FINDINGS: Cardiovascular: Heart is normal size. Aorta normal caliber. Scattered coronary artery and aortic calcifications. No filling defects in the pulmonary arteries to suggest pulmonary emboli. Mediastinum/Nodes: No mediastinal, hilar, or axillary adenopathy. Trachea and esophagus are unremarkable. Thyroid unremarkable. Lungs/Pleura: Areas of consolidation in the lower lobes bilaterally concerning for pneumonia. Small right pleural effusion and trace left pleural effusion. Clustered nodular densities in both upper lobes, also likely infectious. Upper Abdomen: Imaging into the upper abdomen demonstrates no acute findings. Musculoskeletal: Chest wall soft tissues are unremarkable. No acute bony abnormality. Review of the MIP images confirms the above findings. IMPRESSION: No evidence of pulmonary embolus. Airspace opacities in both lower lobes with scattered clustered nodular densities in both upper lobes concerning for pneumonia. Small right pleural effusion and trace left pleural  effusion. Coronary artery disease. Aortic Atherosclerosis (ICD10-I70.0). Electronically Signed   By: KRolm BaptiseM.D.   On: 07/17/2021 02:34      Physical Exam:   General: Adult female, acutely ill, lying in bed in NAD. Patient is able to speak in short sentences without respiratory distress HEENT: MM pink/moist, anicteric sclera, atraumatic, neck supple Neuro: A&O x 4, follows commands, PERRL,  CV: s1s2, RRR, NSR on monitor, no r/m/g Pulm: Regular, dyspneic at rest, coughing, breath sounds clear-BUL & clear-BLL GI: soft, non-distended, non-tender, (+) bs x 4 GU: external female catheter in use Skin: warm/dry, no rashes/lesions noted, face is red and flushed Extremities: warm/dry, pulses + 1 R/P, trace edema noted in bilateral lower extremities    Assessment/Plan:   Acute respiratory failure with hypercapnia and hypoxia Patient unable to tolerate BiPAP secondary to coughing and hypotension, placed on high flow nasal cannula at 40 L/45% FiO2.  -Initially placed on 45%/40L HFNC once BiPAP removed - ABG on 40L/45% HFNC pH 7.29  pCO2 93 pO2 74 93 Bicarb 44.7--> HFNC flow rate increased for CO2 washout 60L/ 45% - Repeat ABG on 60L/45% pH 7.30 pCO2 86 pO2 62 Bicarb 42.3 - VBG at 0500 60L/45% ph 7.32 pCO2 pO2 62 Bicarb 41.7   2. Hypotension Tenuous volume status..Marland Kitcheniuresed earlier in the day with 12085moutput + 1 unmeasured urine output per nursing , BP 79/52 on arrival to bedside  -250 mL fluid bolus given slowly over 1 hour - BP remained low at  79/50, patient had intermittent blood pressures with systolics in the 9060Yadditional 250 mL fluid bolus and extra dose of Midodrine 10 mg PO ordered - ECHO pending..March 2021 ECHO EF 65%   Patient upgraded to stepdown status.  KaNeomia GlassHA, MSN, FNP-BC Nurse Practitioner [facility] [place] [phone]

## 2021-07-17 ENCOUNTER — Inpatient Hospital Stay
Admit: 2021-07-17 | Discharge: 2021-07-17 | Disposition: A | Discharge status: Home / Self Care | Payer: Self-pay | Attending: Internal Medicine | Admitting: Internal Medicine

## 2021-07-17 ENCOUNTER — Inpatient Hospital Stay: Payer: Self-pay

## 2021-07-17 LAB — OSMOLALITY: Osmolality: 288 mOsm/kg (ref 275–295)

## 2021-07-17 LAB — BLOOD GAS, ARTERIAL
Acid-Base Excess: 11.6 mmol/L — ABNORMAL HIGH (ref 0.0–2.0)
Bicarbonate: 42.3 mmol/L — ABNORMAL HIGH (ref 20.0–28.0)
FIO2: 45
O2 Saturation: 88.8 %
Patient temperature: 37
pCO2 arterial: 86 mmHg (ref 32.0–48.0)
pH, Arterial: 7.3 — ABNORMAL LOW (ref 7.350–7.450)
pO2, Arterial: 62 mmHg — ABNORMAL LOW (ref 83.0–108.0)

## 2021-07-17 LAB — CBC
HCT: 46.2 % — ABNORMAL HIGH (ref 36.0–46.0)
Hemoglobin: 14.8 g/dL (ref 12.0–15.0)
MCH: 29.2 pg (ref 26.0–34.0)
MCHC: 32 g/dL (ref 30.0–36.0)
MCV: 91.3 fL (ref 80.0–100.0)
Platelets: 292 10*3/uL (ref 150–400)
RBC: 5.06 MIL/uL (ref 3.87–5.11)
RDW: 14.2 % (ref 11.5–15.5)
WBC: 20.4 10*3/uL — ABNORMAL HIGH (ref 4.0–10.5)
nRBC: 0 % (ref 0.0–0.2)

## 2021-07-17 LAB — LIPID PANEL
Cholesterol: 141 mg/dL (ref 0–200)
HDL: 31 mg/dL — ABNORMAL LOW (ref >40)
LDL Cholesterol: 91 mg/dL (ref 0–99)
Total CHOL/HDL Ratio: 4.5 RATIO
Triglycerides: 93 mg/dL (ref <150)
VLDL: 19 mg/dL (ref 0–40)

## 2021-07-17 LAB — BASIC METABOLIC PANEL
Anion gap: 6 (ref 5–15)
Anion gap: 9 (ref 5–15)
BUN: 36 mg/dL — ABNORMAL HIGH (ref 8–23)
BUN: 42 mg/dL — ABNORMAL HIGH (ref 8–23)
CO2: 35 mmol/L — ABNORMAL HIGH (ref 22–32)
CO2: 38 mmol/L — ABNORMAL HIGH (ref 22–32)
Calcium: 8.2 mg/dL — ABNORMAL LOW (ref 8.9–10.3)
Calcium: 8.6 mg/dL — ABNORMAL LOW (ref 8.9–10.3)
Chloride: 86 mmol/L — ABNORMAL LOW (ref 98–111)
Chloride: 85 mmol/L — ABNORMAL LOW (ref 98–111)
Creatinine, Ser: 0.98 mg/dL (ref 0.44–1.00)
Creatinine, Ser: 0.8 mg/dL (ref 0.44–1.00)
GFR, Estimated: >60 mL/min (ref >60)
GFR, Estimated: >60 mL/min (ref >60)
Glucose, Bld: 131 mg/dL — ABNORMAL HIGH (ref 70–99)
Glucose, Bld: 109 mg/dL — ABNORMAL HIGH (ref 70–99)
Potassium: 4.1 mmol/L (ref 3.5–5.1)
Potassium: 4 mmol/L (ref 3.5–5.1)
Sodium: 127 mmol/L — ABNORMAL LOW (ref 135–145)
Sodium: 132 mmol/L — ABNORMAL LOW (ref 135–145)

## 2021-07-17 LAB — ECHOCARDIOGRAM COMPLETE
AR max vel: 3.75 cm2
AV Area VTI: 3.48 cm2
AV Area mean vel: 3.64 cm2
AV Mean grad: 14 mmHg
AV Peak grad: 22.1 mmHg
Ao pk vel: 2.35 m/s
Area-P 1/2: 2.68 cm2
Calc EF: 74.6 %
Height: 67 in
MV VTI: 4.59 cm2
Single Plane A2C EF: 71.8 %
Single Plane A4C EF: 78 %
Weight: 2800 oz

## 2021-07-17 LAB — BLOOD GAS, VENOUS
Acid-Base Excess: 11.7 mmol/L — ABNORMAL HIGH (ref 0.0–2.0)
Bicarbonate: 41.7 mmol/L — ABNORMAL HIGH (ref 20.0–28.0)
FIO2: 45
O2 Saturation: 89.3 %
Patient temperature: 37
pCO2, Ven: 81 mmHg (ref 44.0–60.0)
pH, Ven: 7.32 (ref 7.250–7.430)
pO2, Ven: 62 mmHg — ABNORMAL HIGH (ref 32.0–45.0)

## 2021-07-17 LAB — HEMOGLOBIN A1C
Hgb A1c MFr Bld: 5.8 % — ABNORMAL HIGH (ref 4.8–5.6)
Mean Plasma Glucose: 119.76 mg/dL

## 2021-07-17 LAB — SODIUM, URINE, RANDOM: Sodium, Ur: 18 mmol/L

## 2021-07-17 LAB — MAGNESIUM: Magnesium: 2.3 mg/dL (ref 1.7–2.4)

## 2021-07-17 LAB — OSMOLALITY, URINE: Osmolality, Ur: 488 mOsm/kg (ref 300–900)

## 2021-07-17 LAB — HIV ANTIBODY (ROUTINE TESTING W REFLEX): HIV Screen 4th Generation wRfx: NONREACTIVE

## 2021-07-17 LAB — HEPARIN LEVEL (UNFRACTIONATED): Heparin Unfractionated: <0.1 IU/mL — ABNORMAL LOW (ref 0.30–0.70)

## 2021-07-17 MED ORDER — SODIUM CHLORIDE 0.9 % IV SOLN
1.0000 g | INTRAVENOUS | Status: DC
  Administered 2021-07-17 – 2021-07-20 (×4): 1 g via INTRAVENOUS
  Filled 2021-07-17 (×4): qty 10
  Filled 2021-07-17: qty 1

## 2021-07-17 MED ORDER — HEPARIN BOLUS VIA INFUSION
2300.0000 [IU] | Freq: Once | INTRAVENOUS | Status: AC
  Administered 2021-07-17: 2300 [IU] via INTRAVENOUS
  Filled 2021-07-17: qty 2300

## 2021-07-17 MED ORDER — PERFLUTREN LIPID MICROSPHERE
1.0000 mL | INTRAVENOUS | Status: AC | PRN
  Administered 2021-07-17: 3 mL via INTRAVENOUS
  Filled 2021-07-17: qty 10

## 2021-07-17 MED ORDER — ENOXAPARIN SODIUM 40 MG/0.4ML IJ SOSY
40.0000 mg | PREFILLED_SYRINGE | Freq: Every day | INTRAMUSCULAR | Status: DC
  Administered 2021-07-17 – 2021-07-21 (×5): 40 mg via SUBCUTANEOUS
  Filled 2021-07-17 (×5): qty 0.4

## 2021-07-17 NOTE — ED Notes (Signed)
Lunch tray at bedside. ?

## 2021-07-17 NOTE — ED Notes (Signed)
Patient eating breakfast tray at this time. 

## 2021-07-17 NOTE — Progress Notes (Signed)
ANTICOAGULATION CONSULT NOTE  Pharmacy Consult for IV heparin Indication: ACS/STEMI  No Known Allergies  Patient Measurements: Height: 5\' 7"  (170.2 cm) Weight: 79.4 kg (175 lb) IBW/kg (Calculated) : 61.6 Heparin Dosing Weight: 77.7 kg  Vital Signs: BP: 83/58 (01/12 0145) Pulse Rate: 75 (01/12 0145)  Labs: Recent Labs    07/16/21 1301 07/16/21 1552 07/17/21 0103  HGB 16.0*  --   --   HCT 48.5*  --   --   PLT 263  --   --   APTT 35  --   --   LABPROT 14.4  --   --   INR 1.1  --   --   HEPARINUNFRC  --   --  <0.10*  CREATININE 0.65  --  0.98  TROPONINIHS 89* 93*  --      Estimated Creatinine Clearance: 64.6 mL/min (by C-G formula based on SCr of 0.98 mg/dL).   Medical History: Past Medical History:  Diagnosis Date   COPD (chronic obstructive pulmonary disease) (HCC)    Coronary artery disease    Hypertension     Medications:  (Not in a hospital admission) Scheduled:   vitamin C  500 mg Oral Daily   aspirin EC  81 mg Oral Daily   azithromycin  250 mg Oral Daily   ipratropium-albuterol  3 mL Nebulization Q4H   methylPREDNISolone (SOLU-MEDROL) injection  80 mg Intravenous Daily   midodrine  10 mg Oral TID WC   multivitamin with minerals  1 tablet Oral Daily   Infusions:   heparin 950 Units/hr (07/16/21 1824)   PRN:  Anti-infectives (From admission, onward)    Start     Dose/Rate Route Frequency Ordered Stop   07/17/21 1000  azithromycin (ZITHROMAX) tablet 250 mg        250 mg Oral Daily 07/16/21 1702     07/16/21 1515  ceFEPIme (MAXIPIME) 2 g in sodium chloride 0.9 % 100 mL IVPB        2 g 200 mL/hr over 30 Minutes Intravenous  Once 07/16/21 1505 07/16/21 1614   07/16/21 1515  azithromycin (ZITHROMAX) 500 mg in sodium chloride 0.9 % 250 mL IVPB        500 mg 250 mL/hr over 60 Minutes Intravenous  Once 07/16/21 1505 07/16/21 1742       No PTA anticoagulation per my chart review.  Assessment: 60YOF presenting with shortness of breath. PMH  includes COPD and CHF. Troponins 89 (x1), BNP also elevated and on 4LO2.   Hgb and plts stable and WNL. aPTT and INR WNL. No mention of bleeding in notes.  Will proceed with heparin.  Goal of Therapy:  Heparin level 0.3-0.7 units/ml Monitor platelets by anticoagulation protocol: Yes  1/12 0103 HL < 0.10, subtherapeutic   Plan:  Give 2300 units bolus x 1 Increase heparin infusion to 1200 units/hr Recheck HL in 6 hrs after rate change Daily CBC. Monitor for s/sx bleeding.  3/12, PharmD, [facility] 07/17/2021 2:23 AM

## 2021-07-17 NOTE — ED Notes (Signed)
Pt back from CT and returned to HFNC.

## 2021-07-17 NOTE — Progress Notes (Signed)
*  PRELIMINARY RESULTS* Echocardiogram 2D Echocardiogram has been performed.  Virginia Norman 07/17/2021, 12:08 PM

## 2021-07-17 NOTE — Progress Notes (Signed)
PROGRESS NOTE    Clarissa Olle  [id number] DOB: [birth year] DOA: 07/16/2021 PCP: [facility] [place]     Brief Narrative:  Karel Goulet is a  61 y.o. female with medical history significant of COPD not on oxygen normally, hypertension, CAD, dCHF, former smoker, who presents with shortness breath.   Patient states that she has SOB for more than 3 days, which has been progressively worsening.  Patient has productive cough with dark-colored sputum production.  She also has generalized weakness, poor appetite, decreased oral intake.  Patient states she had some mild left-sided sharp chest pain earlier, which has resolved. Per report, patient had oxygen desaturating to 80s in the morning. Patient was found to have severe respiratory distress and could not speak in full sentence.  BiPAP was started. Patient was found to have WBC 17.2, troponin level 89 --> 93, BNP 517, negative COVID PCR, procalcitonin 0.14, lactic acid 1.0, sodium 129, renal function okay, temperature 98.1, heart rate 105, RR 44.  Chest x-ray showed mild pulmonary edema and cardiomegaly.  VBG with pH 7.36, CO2 75, O2 76.  Patient was admitted and started on BiPAP.   Overnight, patient had episodes of hypotension.  She was taken off BiPAP and placed on high flow nasal cannula O2.  She was given IV fluid bolus with improvement in her blood pressure.  New events last 24 hours / Subjective: Patient seen in the emergency department with high flow nasal cannula oxygen use.  She states that she is feeling about the same since admission.  Continues to be short of breath, although is feeling well at rest.  She states that she is quit smoking about a year ago.  She does not use oxygen at baseline.  She states that she had some sharp chest pain on the left side that lasted about 5 seconds, associated with cough and deep breath.  Chest pain resolved spontaneously and is currently chest pain-free.  She admits to productive cough with  brown sputum.  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  Assessment & Plan:   Principal Problem:   Acute respiratory failure with hypoxia and hypercapnia (HCC) Active Problems:   Benign essential HTN   CAD (coronary artery disease)   COPD exacerbation (HCC)   Acute on chronic diastolic CHF (congestive heart failure) (HCC)   Elevated troponin   Hyponatremia   Sepsis (HCC)   Acute on chronic diastolic (congestive) heart failure (HCC)   Acute hypoxemic and hypercapnic respiratory failure -Secondary to COPD exacerbation, CAP  -Found to have O2 sat 58% on room air in the ED  -Off BiPAP, currently on high flow nasal cannula O2 -Goal SPO2 88 to 92%  COPD exacerbation, CAP -Continue Solu-Medrol, azithromycin, rocephin, breathing treatments -CTA chest negative for PE, did show airspace opacities bilateral upper lobes  Chronic diastolic heart failure -Without evidence of acute exacerbation at this time.  Hold off on further diuresis given no peripheral edema as well as hypotension  Hypotension -Continue midodrine   Demand ischemia -Troponin 89 >> 93 -Without concerning changes in EKG, currently chest pain-free  -Echocardiogram showed EF 65 to 70%, no regional wall motion abnormalities, grade 2 diastolic dysfunction -Stop IV heparin -Continue aspirin   Hyponatremia -Hold HCTZ and monitor BMP -Improving   DVT prophylaxis:  enoxaparin (LOVENOX) injection 40 mg Start: 07/17/21  2200  Code Status: Full code Family Communication: No family at bedside Disposition Plan:  Status is: Inpatient  Remains inpatient appropriate because: resp failure requiring HF O2     Antimicrobials:  Anti-infectives (From admission, onward)    Start     Dose/Rate Route Frequency Ordered Stop   07/17/21 1000  azithromycin  (ZITHROMAX) tablet 250 mg        250 mg Oral Daily 07/16/21 1702     07/16/21 1515  ceFEPIme (MAXIPIME) 2 g in sodium chloride 0.9 % 100 mL IVPB        2 g 200 mL/hr over 30 Minutes Intravenous  Once 07/16/21 1505 07/16/21 1614   07/16/21 1515  azithromycin (ZITHROMAX) 500 mg in sodium chloride 0.9 % 250 mL IVPB        500 mg 250 mL/hr over 60 Minutes Intravenous  Once 07/16/21 1505 07/16/21 1742        Objective: Vitals:   07/17/21 1015 07/17/21 1045 07/17/21 1115 07/17/21 1230  BP: 98/61 126/73 (!) 143/70 123/74  Pulse: 71 74 80 80  Resp: (!) 30 (!) 28 (!) 24 (!) 34  Temp:      TempSrc:      SpO2: 91% 92% 94% (!) 88%  Weight:      Height:        Intake/Output Summary (Last 24 hours) at 07/17/2021 1309 Last data filed at 07/16/2021 1900 Gross per 24 hour  Intake 402.61 ml  Output 1200 ml  Net -797.39 ml   Filed Weights   07/16/21 1249  Weight: 79.4 kg    Examination:  General exam: Appears calm and comfortable  Respiratory system: Bilateral wheezes, respiratory effort is normal without conversational dyspnea despite being on high flow oxygen, no accessory muscle use Cardiovascular system: S1 & S2 heard, RRR. No murmurs. No pedal edema. Gastrointestinal system: Abdomen is nondistende40d, soft and nontender. Normal bowel sounds heard. Central nervous system: Alert and oriented. No focal neurological deficits. Speech clear.  Extremities: Symmetric in appearance  Skin: No rashes, lesions or ulcers on exposed skin  Psychiatry: Judgement and insight appear normal. Mood & affect appropriate.   Data Reviewed: I have personally reviewed following labs and imaging studies  CBC: Recent Labs  Lab 07/16/21 1301 07/17/21 0623  WBC 17.2* 20.4*  NEUTROABS 14.3*  --   HGB 16.0* 14.8  HCT 48.5* 46.2*  MCV 90.7 91.3  PLT 263 [id number]   Basic Metabolic Panel: Recent Labs  Lab 07/16/21 1301 07/17/21 0103 07/17/21 0623 07/17/21 1132  NA 129* 127*  --  132*  K 4.1 4.1  --   4.0  CL 87* 86*  --  85*  CO2 33* 35*  --  38*  GLUCOSE 129* 131*  --  109*  BUN 22 36*  --  42*  CREATININE 0.65 0.98  --  0.80  CALCIUM 8.7* 8.2*  --  8.6*  MG  --   --  2.3  --    GFR: Estimated Creatinine Clearance: 79.1 mL/min (by C-G formula based on SCr of 0.8 mg/dL). Liver Function Tests: Recent Labs  Lab 07/16/21 1301  AST 19  ALT 18  ALKPHOS 87  BILITOT 0.7  PROT 7.2  ALBUMIN 3.6   No results for input(s): LIPASE, AMYLASE in the last 168 hours. No results for input(s): AMMONIA in the last 168 hours. Coagulation Profile: Recent Labs  Lab 07/16/21 1301  INR 1.1   Cardiac Enzymes: No results for input(s): CKTOTAL, CKMB, CKMBINDEX, TROPONINI in  the last 168 hours. BNP (last 3 results) No results for input(s): PROBNP in the last 8760 hours. HbA1C: Recent Labs    07/17/21 0623  HGBA1C 5.8*   CBG: No results for input(s): GLUCAP in the last 168 hours. Lipid Profile: Recent Labs    07/17/21 0623  CHOL 141  HDL 31*  LDLCALC 91  TRIG 93  CHOLHDL 4.5   Thyroid Function Tests: No results for input(s): TSH, T4TOTAL, FREET4, T3FREE, THYROIDAB in the last 72 hours. Anemia Panel: No results for input(s): VITAMINB12, FOLATE, FERRITIN, TIBC, IRON, RETICCTPCT in the last 72 hours. Sepsis Labs: Recent Labs  Lab 07/16/21 1405 07/16/21 1552  PROCALCITON  --  0.14  LATICACIDVEN 1.0  --     Recent Results (from the past 240 hour(s))  Blood culture (routine x 2)     Status: None (Preliminary result)   Collection Time: 07/16/21  1:33 PM   Specimen: BLOOD  Result Value Ref Range Status   Specimen Description BLOOD BLOOD RIGHT FOREARM  Final   Special Requests   Final    BOTTLES DRAWN AEROBIC AND ANAEROBIC Blood Culture adequate volume   Culture   Final    NO GROWTH < 24 HOURS Performed at [facility], [address] [place], [place] [address]    Report Status PENDING  Incomplete  Blood culture (routine x 2)     Status: None (Preliminary result)    Collection Time: 07/16/21  1:38 PM   Specimen: BLOOD  Result Value Ref Range Status   Specimen Description BLOOD BLOOD LEFT FOREARM  Final   Special Requests   Final    BOTTLES DRAWN AEROBIC AND ANAEROBIC Blood Culture adequate volume   Culture   Final    NO GROWTH < 24 HOURS Performed at [facility], [address], [place], [place] [address]    Report Status PENDING  Incomplete  Resp Panel by RT-PCR (Flu A&B, Covid) Nasopharyngeal Swab     Status: None   Collection Time: 07/16/21  2:05 PM   Specimen: Nasopharyngeal Swab; Nasopharyngeal(NP) swabs in vial transport medium  Result Value Ref Range Status   SARS Coronavirus 2 by RT PCR NEGATIVE NEGATIVE Final    Comment: (NOTE) SARS-CoV-2 target nucleic acids are NOT DETECTED.  The SARS-CoV-2 RNA is generally detectable in upper respiratory specimens during the acute phase of infection. The lowest concentration of SARS-CoV-2 viral copies this assay can detect is 138 copies/mL. A negative result does not preclude SARS-Cov-2 infection and should not be used as the sole basis for treatment or other patient management decisions. A negative result may occur with  improper specimen collection/handling, submission of specimen other than nasopharyngeal swab, presence of viral mutation(s) within the areas targeted by this assay, and inadequate number of viral copies(<138 copies/mL). A negative result must be combined with clinical observations, patient history, and epidemiological information. The expected result is Negative.  Fact Sheet for Patients:  EntrepreneurPulse.com.au  Fact Sheet for Healthcare Providers:  IncredibleEmployment.be  This test is no t yet approved or cleared by the [place] FDA and  has been authorized for detection and/or diagnosis of SARS-CoV-2 by FDA under an Emergency Use Authorization (EUA). This EUA will remain  in effect (meaning this test can be used)  for the duration of the COVID-19 declaration under Section 564(b)(1) of the Act, 21 U.S.C.section 360bbb-3(b)(1), unless the authorization is terminated  or revoked sooner.       Influenza A by PCR NEGATIVE NEGATIVE Final  Influenza B by PCR NEGATIVE NEGATIVE Final    Comment: (NOTE) The Xpert Xpress SARS-CoV-2/FLU/RSV plus assay is intended as an aid in the diagnosis of influenza from Nasopharyngeal swab specimens and should not be used as a sole basis for treatment. Nasal washings and aspirates are unacceptable for Xpert Xpress SARS-CoV-2/FLU/RSV testing.  Fact Sheet for Patients: EntrepreneurPulse.com.au  Fact Sheet for Healthcare Providers: IncredibleEmployment.be  This test is not yet approved or cleared by the [place] FDA and has been authorized for detection and/or diagnosis of SARS-CoV-2 by FDA under an Emergency Use Authorization (EUA). This EUA will remain in effect (meaning this test can be used) for the duration of the COVID-19 declaration under Section 564(b)(1) of the Act, 21 U.S.C. section 360bbb-3(b)(1), unless the authorization is terminated or revoked.  Performed at [facility], [address], [place], [place] [address]       Radiology Studies: DG Chest 2 View  Result Date: 07/16/2021 CLINICAL DATA:  61 year old female with shortness of breath. EXAM: CHEST - 2 VIEW COMPARISON:  10/02/2019 FINDINGS: The mediastinal contours are within normal limits. Similar appearing moderate cardiomegaly. Atherosclerotic calcifications of the aortic arch. Hazy, peripheral and lower lobe predominant pulmonary opacities. Trace bilateral pleural effusions. No pneumothorax. No acute osseous abnormality. Diffuse osteopenia. IMPRESSION: 1. Mild pulmonary edema and trace bilateral pleural effusions. 2. Similar appearing moderate cardiomegaly. 3.  Aortic Atherosclerosis (ICD10-I70.0). Electronically Signed   By: Ruthann Cancer M.D.    On: 07/16/2021 13:47   CT Angio Chest Pulmonary Embolism (PE) W or WO Contrast  Result Date: 07/17/2021 CLINICAL DATA:  Pulmonary embolism (PE) suspected, high prob. Shortness of breath, cough EXAM: CT ANGIOGRAPHY CHEST WITH CONTRAST TECHNIQUE: Multidetector CT imaging of the chest was performed using the standard protocol during bolus administration of intravenous contrast. Multiplanar CT image reconstructions and MIPs were obtained to evaluate the vascular anatomy. RADIATION DOSE REDUCTION: This exam was performed according to the departmental dose-optimization program which includes automated exposure control, adjustment of the mA and/or kV according to patient size and/or use of iterative reconstruction technique. CONTRAST:  84mL OMNIPAQUE IOHEXOL 350 MG/ML SOLN COMPARISON:  06/13/2020 FINDINGS: Cardiovascular: Heart is normal size. Aorta normal caliber. Scattered coronary artery and aortic calcifications. No filling defects in the pulmonary arteries to suggest pulmonary emboli. Mediastinum/Nodes: No mediastinal, hilar, or axillary adenopathy. Trachea and esophagus are unremarkable. Thyroid unremarkable. Lungs/Pleura: Areas of consolidation in the lower lobes bilaterally concerning for pneumonia. Small right pleural effusion and trace left pleural effusion. Clustered nodular densities in both upper lobes, also likely infectious. Upper Abdomen: Imaging into the upper abdomen demonstrates no acute findings. Musculoskeletal: Chest wall soft tissues are unremarkable. No acute bony abnormality. Review of the MIP images confirms the above findings. IMPRESSION: No evidence of pulmonary embolus. Airspace opacities in both lower lobes with scattered clustered nodular densities in both upper lobes concerning for pneumonia. Small right pleural effusion and trace left pleural effusion. Coronary artery disease. Aortic Atherosclerosis (ICD10-I70.0). Electronically Signed   By: Rolm Baptise M.D.   On: 07/17/2021 02:34       Scheduled Meds:  vitamin C  500 mg Oral Daily   aspirin EC  81 mg Oral Daily   azithromycin  250 mg Oral Daily   enoxaparin (LOVENOX) injection  40 mg Subcutaneous QHS   ipratropium-albuterol  3 mL Nebulization Q4H   methylPREDNISolone (SOLU-MEDROL) injection  80 mg Intravenous Daily   midodrine  10 mg Oral TID WC   multivitamin with minerals  1 tablet Oral  Daily   Continuous Infusions:   LOS: 1 day      Time spent: 40 minutes   Dessa Phi, DO [facility] 07/17/2021, 1:09 PM   Available via Epic secure chat 7am-7pm After these hours, please refer to coverage provider listed on [email or web address]

## 2021-07-17 NOTE — ED Notes (Signed)
Message sent to Alvino Chapel, MD regarding patient's O2 being in the upper 80's while sleeping on high flow. Per Alvino Chapel, MD, goal is between 88-92%.

## 2021-07-17 NOTE — ED Notes (Signed)
Patient eating dinner tray at this time. 

## 2021-07-17 NOTE — ED Notes (Signed)
Pt transported to CT by this RN on a monitor and 6L [place].

## 2021-07-18 ENCOUNTER — Other Ambulatory Visit: Payer: Self-pay

## 2021-07-18 ENCOUNTER — Encounter: Payer: Self-pay | Admitting: Internal Medicine

## 2021-07-18 LAB — BASIC METABOLIC PANEL
Anion gap: 6 (ref 5–15)
BUN: 36 mg/dL — ABNORMAL HIGH (ref 8–23)
CO2: 39 mmol/L — ABNORMAL HIGH (ref 22–32)
Calcium: 8.4 mg/dL — ABNORMAL LOW (ref 8.9–10.3)
Chloride: 88 mmol/L — ABNORMAL LOW (ref 98–111)
Creatinine, Ser: 0.62 mg/dL (ref 0.44–1.00)
GFR, Estimated: >60 mL/min (ref >60)
Glucose, Bld: 109 mg/dL — ABNORMAL HIGH (ref 70–99)
Potassium: 4.1 mmol/L (ref 3.5–5.1)
Sodium: 133 mmol/L — ABNORMAL LOW (ref 135–145)

## 2021-07-18 LAB — CBC
HCT: 43 % (ref 36.0–46.0)
Hemoglobin: 13.9 g/dL (ref 12.0–15.0)
MCH: 30.3 pg (ref 26.0–34.0)
MCHC: 32.3 g/dL (ref 30.0–36.0)
MCV: 93.7 fL (ref 80.0–100.0)
Platelets: 283 10*3/uL (ref 150–400)
RBC: 4.59 MIL/uL (ref 3.87–5.11)
RDW: 13.8 % (ref 11.5–15.5)
WBC: 17.8 10*3/uL — ABNORMAL HIGH (ref 4.0–10.5)
nRBC: 0 % (ref 0.0–0.2)

## 2021-07-18 LAB — EXPECTORATED SPUTUM ASSESSMENT W GRAM STAIN, RFLX TO RESP C

## 2021-07-18 MED ORDER — MIDODRINE HCL 5 MG PO TABS
5.0000 mg | ORAL_TABLET | Freq: Three times a day (TID) | ORAL | Status: DC
  Administered 2021-07-19 – 2021-07-20 (×4): 5 mg via ORAL
  Filled 2021-07-18 (×4): qty 1

## 2021-07-18 NOTE — ED Notes (Signed)
Family updated as to patient's status.  Daughter at bedside.  

## 2021-07-18 NOTE — ED Notes (Signed)
Informed RN bed assigned 

## 2021-07-18 NOTE — ED Notes (Signed)
PAtient requesting to sit on side of bed. Patient on side of bed at this time. Oxygen levels remaining at 90% while sitting on side of bed

## 2021-07-18 NOTE — Progress Notes (Signed)
PROGRESS NOTE    Virginia Norman  [id number] DOB: May 23, 1961 DOA: 07/16/2021 PCP: [facility] [place]     Brief Narrative:  Virginia Norman is a  61 y.o. female with medical history significant of COPD not on oxygen normally, hypertension, CAD, dCHF, former smoker, who presents with shortness breath.   Patient states that she has SOB for more than 3 days, which has been progressively worsening.  Patient has productive cough with dark-colored sputum production.  She also has generalized weakness, poor appetite, decreased oral intake.  Patient states she had some mild left-sided sharp chest pain earlier, which has resolved. Per report, patient had oxygen desaturating to 80s in the morning. Patient was found to have severe respiratory distress and could not speak in full sentence.  BiPAP was started. Patient was found to have WBC 17.2, troponin level 89 --> 93, BNP 517, negative COVID PCR, procalcitonin 0.14, lactic acid 1.0, sodium 129, renal function okay, temperature 98.1, heart rate 105, RR 44.  Chest x-ray showed mild pulmonary edema and cardiomegaly.  VBG with pH 7.36, CO2 75, O2 76.  Patient was admitted and started on BiPAP.   Overnight, patient had episodes of hypotension.  She was taken off BiPAP and placed on high flow nasal cannula O2.  She was given IV fluid bolus with improvement in her blood pressure.  New events last 24 hours / Subjective: Patient seen in the emergency department with high flow nasal cannula oxygen use.  No new complaints or changes overnight.  She states that she no longer sleeps flat in bed as she cannot breathe when laying flat.  She sleeps at an incline.  She has not been out of bed since admission.  No further chest pains.  Continues to have a productive cough with brown sputum.  No edema.  Assessment & Plan:   Principal Problem:   Acute respiratory failure with hypoxia and hypercapnia (HCC) Active Problems:   Benign essential HTN   CAD  (coronary artery disease)   COPD exacerbation (HCC)   Acute on chronic diastolic CHF (congestive heart failure) (HCC)   Elevated troponin   Hyponatremia   Sepsis (HCC)   Acute on chronic diastolic (congestive) heart failure (HCC)   Acute hypoxemic and hypercapnic respiratory failure -Secondary to COPD exacerbation, CAP  -Found to have O2 sat 58% on room air in the ED  -Off BiPAP, currently on high flow nasal cannula O2 -Goal SPO2 88 to 92% wean as able  COPD exacerbation, CAP -Continue Solu-Medrol, azithromycin, rocephin, breathing treatments -CTA chest negative for PE, did show airspace opacities bilateral upper lobes  Chronic diastolic heart failure -Without evidence of acute exacerbation at this time.  Hold off on further diuresis given no peripheral edema as well as hypotension  Hypotension -Continue midodrine   Demand ischemia -Troponin 89 >> 93 -Without concerning changes in EKG, currently chest pain-free  -Echocardiogram showed EF 65 to 70%, no regional wall motion abnormalities, grade 2 diastolic dysfunction -Stop IV heparin -Continue aspirin   Hyponatremia -Hold HCTZ and monitor BMP -Improving   DVT prophylaxis:  enoxaparin (LOVENOX) injection 40 mg Start: 07/17/21 2200  Code Status: Full code Family Communication: No family at bedside Disposition Plan:  Status is: Inpatient  Remains inpatient appropriate because: resp failure requiring HF O2     Antimicrobials:  Anti-infectives (From admission, onward)    Start     Dose/Rate Route Frequency Ordered Stop   07/17/21 1400  cefTRIAXone (ROCEPHIN) 1 g in sodium chloride 0.9 %  100 mL IVPB        1 g 200 mL/hr over 30 Minutes Intravenous Every 24 hours 07/17/21 1320     07/17/21 1000  azithromycin (ZITHROMAX) tablet 250 mg        250 mg Oral Daily 07/16/21 1702     07/16/21 1515  ceFEPIme (MAXIPIME) 2 g in sodium chloride 0.9 % 100 mL IVPB        2 g 200 mL/hr over 30 Minutes Intravenous  Once 07/16/21  1505 07/16/21 1614   07/16/21 1515  azithromycin (ZITHROMAX) 500 mg in sodium chloride 0.9 % 250 mL IVPB        500 mg 250 mL/hr over 60 Minutes Intravenous  Once 07/16/21 1505 07/16/21 1742        Objective: Vitals:   07/18/21 1030 07/18/21 1100 07/18/21 1130 07/18/21 1200  BP: 120/74 126/67 133/74 124/82  Pulse: 78 80 78 80  Resp: 17 (!) 24 19 16   Temp:      TempSrc:      SpO2:  91% 93% 92%  Weight:      Height:        Intake/Output Summary (Last 24 hours) at 07/18/2021 1245 Last data filed at 07/18/2021 1001 Gross per 24 hour  Intake 101.41 ml  Output 1100 ml  Net -998.59 ml    Filed Weights   07/16/21 1249  Weight: 79.4 kg    Examination:  General exam: Appears calm and comfortable  Respiratory system: Wheezes and left upper lobe, no conversational dyspnea or respiratory distress Cardiovascular system: S1 & S2 heard, RRR. No murmurs. No pedal edema. Gastrointestinal system: Abdomen is nondistended, soft and nontender. Normal bowel sounds heard. Central nervous system: Alert and oriented. No focal neurological deficits. Speech clear.  Extremities: Symmetric in appearance  Skin: No rashes, lesions or ulcers on exposed skin  Psychiatry: Judgement and insight appear normal. Mood & affect appropriate.   Data Reviewed: I have personally reviewed following labs and imaging studies  CBC: Recent Labs  Lab 07/16/21 1301 07/17/21 0623 07/18/21 0504  WBC 17.2* 20.4* 17.8*  NEUTROABS 14.3*  --   --   HGB 16.0* 14.8 13.9  HCT 48.5* 46.2* 43.0  MCV 90.7 91.3 93.7  PLT 263 292 [id number]    Basic Metabolic Panel: Recent Labs  Lab 07/16/21 1301 07/17/21 0103 07/17/21 0623 07/17/21 1132 07/18/21 0504  NA 129* 127*  --  132* 133*  K 4.1 4.1  --  4.0 4.1  CL 87* 86*  --  85* 88*  CO2 33* 35*  --  38* 39*  GLUCOSE 129* 131*  --  109* 109*  BUN 22 36*  --  42* 36*  CREATININE 0.65 0.98  --  0.80 0.62  CALCIUM 8.7* 8.2*  --  8.6* 8.4*  MG  --   --  2.3  --   --      GFR: Estimated Creatinine Clearance: 79.1 mL/min (by C-G formula based on SCr of 0.62 mg/dL). Liver Function Tests: Recent Labs  Lab 07/16/21 1301  AST 19  ALT 18  ALKPHOS 87  BILITOT 0.7  PROT 7.2  ALBUMIN 3.6    No results for input(s): LIPASE, AMYLASE in the last 168 hours. No results for input(s): AMMONIA in the last 168 hours. Coagulation Profile: Recent Labs  Lab 07/16/21 1301  INR 1.1    Cardiac Enzymes: No results for input(s): CKTOTAL, CKMB, CKMBINDEX, TROPONINI in the last 168 hours. BNP (last 3 results) No results for  input(s): PROBNP in the last 8760 hours. HbA1C: Recent Labs    07/17/21 0623  HGBA1C 5.8*    CBG: No results for input(s): GLUCAP in the last 168 hours. Lipid Profile: Recent Labs    07/17/21 0623  CHOL 141  HDL 31*  LDLCALC 91  TRIG 93  CHOLHDL 4.5    Thyroid Function Tests: No results for input(s): TSH, T4TOTAL, FREET4, T3FREE, THYROIDAB in the last 72 hours. Anemia Panel: No results for input(s): VITAMINB12, FOLATE, FERRITIN, TIBC, IRON, RETICCTPCT in the last 72 hours. Sepsis Labs: Recent Labs  Lab 07/16/21 1405 07/16/21 1552  PROCALCITON  --  0.14  LATICACIDVEN 1.0  --      Recent Results (from the past 240 hour(s))  Expectorated Sputum Assessment w Gram Stain, Rflx to Resp Cult     Status: None   Collection Time: 07/16/21  5:19 AM   Specimen: Sputum  Result Value Ref Range Status   Specimen Description SPUTUM  Final   Special Requests NONE  Final   Sputum evaluation   Final    THIS SPECIMEN IS ACCEPTABLE FOR SPUTUM CULTURE Performed at [facility], [address] [place], [place] [address]    Report Status 07/18/2021 FINAL  Final  Blood culture (routine x 2)     Status: None (Preliminary result)   Collection Time: 07/16/21  1:33 PM   Specimen: BLOOD  Result Value Ref Range Status   Specimen Description BLOOD BLOOD RIGHT FOREARM  Final   Special Requests   Final    BOTTLES DRAWN AEROBIC AND  ANAEROBIC Blood Culture adequate volume   Culture   Final    NO GROWTH 2 DAYS Performed at [facility], [address] [place], [place] [address]    Report Status PENDING  Incomplete  Blood culture (routine x 2)     Status: None (Preliminary result)   Collection Time: 07/16/21  1:38 PM   Specimen: BLOOD  Result Value Ref Range Status   Specimen Description BLOOD BLOOD LEFT FOREARM  Final   Special Requests   Final    BOTTLES DRAWN AEROBIC AND ANAEROBIC Blood Culture adequate volume   Culture   Final    NO GROWTH 2 DAYS Performed at [facility], [address], [place], [place] [address]    Report Status PENDING  Incomplete  Resp Panel by RT-PCR (Flu A&B, Covid) Nasopharyngeal Swab     Status: None   Collection Time: 07/16/21  2:05 PM   Specimen: Nasopharyngeal Swab; Nasopharyngeal(NP) swabs in vial transport medium  Result Value Ref Range Status   SARS Coronavirus 2 by RT PCR NEGATIVE NEGATIVE Final    Comment: (NOTE) SARS-CoV-2 target nucleic acids are NOT DETECTED.  The SARS-CoV-2 RNA is generally detectable in upper respiratory specimens during the acute phase of infection. The lowest concentration of SARS-CoV-2 viral copies this assay can detect is 138 copies/mL. A negative result does not preclude SARS-Cov-2 infection and should not be used as the sole basis for treatment or other patient management decisions. A negative result may occur with  improper specimen collection/handling, submission of specimen other than nasopharyngeal swab, presence of viral mutation(s) within the areas targeted by this assay, and inadequate number of viral copies(<138 copies/mL). A negative result must be combined with clinical observations, patient history, and epidemiological information. The expected result is Negative.  Fact Sheet for Patients:  EntrepreneurPulse.com.au  Fact Sheet for Healthcare Providers:   IncredibleEmployment.be  This test is no t yet approved or  cleared by the [place] and  has been authorized for detection and/or diagnosis of SARS-CoV-2 by FDA under an Emergency Use Authorization (EUA). This EUA will remain  in effect (meaning this test can be used) for the duration of the COVID-19 declaration under Section 564(b)(1) of the Act, 21 U.S.C.section 360bbb-3(b)(1), unless the authorization is terminated  or revoked sooner.       Influenza A by PCR NEGATIVE NEGATIVE Final   Influenza B by PCR NEGATIVE NEGATIVE Final    Comment: (NOTE) The Xpert Xpress SARS-CoV-2/FLU/RSV plus assay is intended as an aid in the diagnosis of influenza from Nasopharyngeal swab specimens and should not be used as a sole basis for treatment. Nasal washings and aspirates are unacceptable for Xpert Xpress SARS-CoV-2/FLU/RSV testing.  Fact Sheet for Patients: EntrepreneurPulse.com.au  Fact Sheet for Healthcare Providers: IncredibleEmployment.be  This test is not yet approved or cleared by the [place] FDA and has been authorized for detection and/or diagnosis of SARS-CoV-2 by FDA under an Emergency Use Authorization (EUA). This EUA will remain in effect (meaning this test can be used) for the duration of the COVID-19 declaration under Section 564(b)(1) of the Act, 21 U.S.C. section 360bbb-3(b)(1), unless the authorization is terminated or revoked.  Performed at [facility], [address] [place],  [address]        Radiology Studies: DG Chest 2 View  Result Date: 07/16/2021 CLINICAL DATA:  61 year old female with shortness of breath. EXAM: CHEST - 2 VIEW COMPARISON:  10/02/2019 FINDINGS: The mediastinal contours are within normal limits. Similar appearing moderate cardiomegaly. Atherosclerotic calcifications of the aortic arch. Hazy, peripheral and lower lobe predominant pulmonary opacities. Trace  bilateral pleural effusions. No pneumothorax. No acute osseous abnormality. Diffuse osteopenia. IMPRESSION: 1. Mild pulmonary edema and trace bilateral pleural effusions. 2. Similar appearing moderate cardiomegaly. 3.  Aortic Atherosclerosis (ICD10-I70.0). Electronically Signed   By: Ruthann Cancer M.D.   On: 07/16/2021 13:47   CT Angio Chest Pulmonary Embolism (PE) W or WO Contrast  Result Date: 07/17/2021 CLINICAL DATA:  Pulmonary embolism (PE) suspected, high prob. Shortness of breath, cough EXAM: CT ANGIOGRAPHY CHEST WITH CONTRAST TECHNIQUE: Multidetector CT imaging of the chest was performed using the standard protocol during bolus administration of intravenous contrast. Multiplanar CT image reconstructions and MIPs were obtained to evaluate the vascular anatomy. RADIATION DOSE REDUCTION: This exam was performed according to the departmental dose-optimization program which includes automated exposure control, adjustment of the mA and/or kV according to patient size and/or use of iterative reconstruction technique. CONTRAST:  15mL OMNIPAQUE IOHEXOL 350 MG/ML SOLN COMPARISON:  06/13/2020 FINDINGS: Cardiovascular: Heart is normal size. Aorta normal caliber. Scattered coronary artery and aortic calcifications. No filling defects in the pulmonary arteries to suggest pulmonary emboli. Mediastinum/Nodes: No mediastinal, hilar, or axillary adenopathy. Trachea and esophagus are unremarkable. Thyroid unremarkable. Lungs/Pleura: Areas of consolidation in the lower lobes bilaterally concerning for pneumonia. Small right pleural effusion and trace left pleural effusion. Clustered nodular densities in both upper lobes, also likely infectious. Upper Abdomen: Imaging into the upper abdomen demonstrates no acute findings. Musculoskeletal: Chest wall soft tissues are unremarkable. No acute bony abnormality. Review of the MIP images confirms the above findings. IMPRESSION: No evidence of pulmonary embolus. Airspace opacities  in both lower lobes with scattered clustered nodular densities in both upper lobes concerning for pneumonia. Small right pleural effusion and trace left pleural effusion. Coronary artery disease. Aortic Atherosclerosis (ICD10-I70.0). Electronically Signed   By: Rolm Baptise M.D.   On: 07/17/2021  02:34   ECHOCARDIOGRAM COMPLETE  Result Date: 07/17/2021    ECHOCARDIOGRAM REPORT   Patient Name:   Kaianna Catanzaro Date of Exam: 07/17/2021 Medical Rec #:  [id number]      Height:       67.0 in Accession #:    [id number]     Weight:       175.0 lb Date of Birth:  05-26-1961     BSA:          1.911 m Patient Age:    15 years       BP:           107/58 mmHg Patient Gender: F              HR:           75 bpm. Exam Location:  [facility] Procedure: 2D Echo, Color Doppler, Cardiac Doppler and Intracardiac            Opacification Agent Indications:     I50.31 congestive heart failure-Acute Diastolic  History:         Patient has prior history of Echocardiogram examinations. CAD,                  COPD; Risk Factors:Hypertension.  Sonographer:     Charmayne Sheer Referring Phys:  [id number] Soledad Gerlach NIU Diagnosing Phys: Donnelly Angelica  Sonographer Comments: Technically difficult study due to poor echo windows. Image acquisition challenging due to COPD and Image acquisition challenging due to respiratory motion. IMPRESSIONS  1. Left ventricular ejection fraction, by estimation, is 65 to 70%. The left ventricle has normal function. The left ventricle has no regional wall motion abnormalities. There is mild left ventricular hypertrophy. Left ventricular diastolic parameters are consistent with Grade II diastolic dysfunction (pseudonormalization).  2. Right ventricular systolic function is normal. The right ventricular size is normal.  3. Left atrial size was severely dilated.  4. The mitral valve is grossly normal. Mild mitral valve regurgitation. Mild mitral stenosis.  5. The aortic valve is calcified. Aortic valve regurgitation is not visualized. Aortic  valve sclerosis/calcification is present, without any evidence of aortic stenosis.  6. The inferior vena cava is normal in size with <50% respiratory variability, suggesting right atrial pressure of 8 mmHg. FINDINGS  Left Ventricle: Left ventricular ejection fraction, by estimation, is 65 to 70%. The left ventricle has normal function. The left ventricle has no regional wall motion abnormalities. Definity contrast agent was given IV to delineate the left ventricular  endocardial borders. The left ventricular internal cavity size was normal in size. There is mild left ventricular hypertrophy. Left ventricular diastolic parameters are consistent with Grade II diastolic dysfunction (pseudonormalization). Right Ventricle: The right ventricular size is normal. No increase in right ventricular wall thickness. Right ventricular systolic function is normal. Left Atrium: Left atrial size was severely dilated. Right Atrium: Right atrial size was normal in size. Pericardium: There is no evidence of pericardial effusion. Mitral Valve: The mitral valve is grossly normal. Mild mitral valve regurgitation. Mild mitral valve stenosis. MV peak gradient, 9.6 mmHg. The mean mitral valve gradient is 5.0 mmHg. Tricuspid Valve: The tricuspid valve is not well visualized. Tricuspid valve regurgitation is not demonstrated. Aortic Valve: The aortic valve is calcified. Aortic valve regurgitation is not visualized. Aortic valve sclerosis/calcification is present, without any evidence of aortic stenosis. Aortic valve mean gradient measures 14.0 mmHg. Aortic valve peak gradient  measures 22.1 mmHg. Aortic valve area, by VTI measures 3.48 cm. Pulmonic Valve: The pulmonic valve was  not well visualized. Aorta: The aortic root is normal in size and structure. Venous: The inferior vena cava is normal in size with less than 50% respiratory variability, suggesting right atrial pressure of 8 mmHg. IAS/Shunts: The interatrial septum was not well  visualized.  LEFT VENTRICLE PLAX 2D LVIDd:         3.80 cm      Diastology LV PW:         1.31 cm      LV e' medial:    8.59 cm/s LV IVS:        1.27 cm      LV E/e' medial:  15.0 LVOT diam:     2.30 cm      LV e' lateral:   9.14 cm/s LV SV:         173          LV E/e' lateral: 14.1 LV SV Index:   91 LVOT Area:     4.15 cm  LV Volumes (MOD) LV vol d, MOD A2C: 123.0 ml LV vol d, MOD A4C: 169.0 ml LV vol s, MOD A2C: 34.7 ml LV vol s, MOD A4C: 37.1 ml LV SV MOD A2C:     88.3 ml LV SV MOD A4C:     169.0 ml LV SV MOD BP:      109.6 ml RIGHT VENTRICLE RV Basal diam:  4.14 cm LEFT ATRIUM              Index        RIGHT ATRIUM           Index LA Vol (A2C):   101.0 ml 52.86 ml/m  RA Area:     15.30 cm LA Vol (A4C):   114.0 ml 59.66 ml/m  RA Volume:   43.50 ml  22.77 ml/m LA Biplane Vol: 112.0 ml 58.62 ml/m  AORTIC VALVE                     PULMONIC VALVE AV Area (Vmax):    3.75 cm      PV Vmax:       1.14 m/s AV Area (Vmean):   3.64 cm      PV Vmean:      75.800 cm/s AV Area (VTI):     3.48 cm      PV VTI:        0.234 m AV Vmax:           235.00 cm/s   PV Peak grad:  5.2 mmHg AV Vmean:          178.000 cm/s  PV Mean grad:  3.0 mmHg AV VTI:            0.498 m AV Peak Grad:      22.1 mmHg AV Mean Grad:      14.0 mmHg LVOT Vmax:         212.00 cm/s LVOT Vmean:        156.000 cm/s LVOT VTI:          0.417 m LVOT/AV VTI ratio: 0.84  AORTA Ao Root diam: 3.00 cm MITRAL VALVE MV Area (PHT): 2.68 cm     SHUNTS MV Area VTI:   4.59 cm     Systemic VTI:  0.42 m MV Peak grad:  9.6 mmHg     Systemic Diam: 2.30 cm MV Mean grad:  5.0 mmHg MV Vmax:       1.55 m/s MV Vmean:  101.0 cm/s MV Decel Time: 283 msec MV E velocity: 129.00 cm/s MV A velocity: 105.00 cm/s MV E/A ratio:  1.23 Donnelly Angelica Electronically signed by Donnelly Angelica Signature Date/Time: 07/17/2021/1:10:19 PM    Final       Scheduled Meds:  vitamin C  500 mg Oral Daily   aspirin EC  81 mg Oral Daily   azithromycin  250 mg Oral Daily   enoxaparin (LOVENOX)  injection  40 mg Subcutaneous QHS   ipratropium-albuterol  3 mL Nebulization Q4H   methylPREDNISolone (SOLU-MEDROL) injection  80 mg Intravenous Daily   midodrine  10 mg Oral TID WC   multivitamin with minerals  1 tablet Oral Daily   Continuous Infusions:  cefTRIAXone (ROCEPHIN)  IV Stopped (07/17/21 1529)     LOS: 2 days     Dessa Phi, DO [facility] 07/18/2021, 12:45 PM   Available via Epic secure chat 7am-7pm After these hours, please refer to coverage provider listed on [email or web address]

## 2021-07-19 LAB — CBC
HCT: 43 % (ref 36.0–46.0)
Hemoglobin: 13.7 g/dL (ref 12.0–15.0)
MCH: 29.7 pg (ref 26.0–34.0)
MCHC: 31.9 g/dL (ref 30.0–36.0)
MCV: 93.1 fL (ref 80.0–100.0)
Platelets: 298 10*3/uL (ref 150–400)
RBC: 4.62 MIL/uL (ref 3.87–5.11)
RDW: 13.5 % (ref 11.5–15.5)
WBC: 14.3 10*3/uL — ABNORMAL HIGH (ref 4.0–10.5)
nRBC: 0 % (ref 0.0–0.2)

## 2021-07-19 LAB — BASIC METABOLIC PANEL
Anion gap: 3 — ABNORMAL LOW (ref 5–15)
BUN: 19 mg/dL (ref 8–23)
CO2: 42 mmol/L — ABNORMAL HIGH (ref 22–32)
Calcium: 8.3 mg/dL — ABNORMAL LOW (ref 8.9–10.3)
Chloride: 87 mmol/L — ABNORMAL LOW (ref 98–111)
Creatinine, Ser: 0.6 mg/dL (ref 0.44–1.00)
GFR, Estimated: >60 mL/min (ref >60)
Glucose, Bld: 92 mg/dL (ref 70–99)
Potassium: 4.7 mmol/L (ref 3.5–5.1)
Sodium: 132 mmol/L — ABNORMAL LOW (ref 135–145)

## 2021-07-19 NOTE — Progress Notes (Signed)
PROGRESS NOTE    Virginia Norman  [id number] DOB: 1961/06/18 DOA: 07/16/2021 PCP: [facility] [place]     Brief Narrative:  Virginia Norman is a  61 y.o. female with medical history significant of COPD not on oxygen normally, hypertension, CAD, dCHF, former smoker, who presents with shortness breath.   Patient states that she has SOB for more than 3 days, which has been progressively worsening.  Patient has productive cough with dark-colored sputum production.  She also has generalized weakness, poor appetite, decreased oral intake.  Patient states she had some mild left-sided sharp chest pain earlier, which has resolved. Per report, patient had oxygen desaturating to 80s in the morning. Patient was found to have severe respiratory distress and could not speak in full sentence.  BiPAP was started. Patient was found to have WBC 17.2, troponin level 89 --> 93, BNP 517, negative COVID PCR, procalcitonin 0.14, lactic acid 1.0, sodium 129, renal function okay, temperature 98.1, heart rate 105, RR 44.  Chest x-ray showed mild pulmonary edema and cardiomegaly.  VBG with pH 7.36, CO2 75, O2 76.  Patient was admitted and started on BiPAP.   Overnight, patient had episodes of hypotension.  She was taken off BiPAP and placed on high flow nasal cannula O2.  She was given IV fluid bolus with improvement in her blood pressure.  New events last 24 hours / Subjective: Patient sitting at the side of bed, respiratory therapy was able to wean her oxygen down to 45 L.  She wants to get out of bed today.  Denies any worsening shortness of breath.  Cough seems to have gotten better.  Assessment & Plan:   Principal Problem:   Acute respiratory failure with hypoxia and hypercapnia (HCC) Active Problems:   Benign essential HTN   CAD (coronary artery disease)   COPD exacerbation (HCC)   Acute on chronic diastolic CHF (congestive heart failure) (HCC)   Elevated troponin   Hyponatremia   Sepsis (HCC)    Acute on chronic diastolic (congestive) heart failure (HCC)   Acute hypoxemic and hypercapnic respiratory failure -Secondary to COPD exacerbation, CAP  -Found to have O2 sat 58% on room air in the ED  -Off BiPAP, currently on high flow nasal cannula O2 -Goal SPO2 88 to 92% wean as able  COPD exacerbation, CAP -Continue Solu-Medrol, azithromycin, rocephin, breathing treatments -CTA chest negative for PE, did show airspace opacities bilateral upper lobes  Chronic diastolic heart failure -Without evidence of acute exacerbation at this time.  Hold off on further diuresis given no peripheral edema as well as hypotension  Hypotension -Continue midodrine, wean    Demand ischemia -Troponin 89 >> 93 -Without concerning changes in EKG, currently chest pain-free  -Echocardiogram showed EF 65 to 70%, no regional wall motion abnormalities, grade 2 diastolic dysfunction -Stop IV heparin -Continue aspirin   Hyponatremia -Hold HCTZ and monitor BMP -Improved  DVT prophylaxis:  enoxaparin (LOVENOX) injection 40 mg Start: 07/17/21 2200  Code Status: Full code Family Communication: No family at bedside Disposition Plan:  Status is: Inpatient  Remains inpatient appropriate because: resp failure requiring HF O2     Antimicrobials:  Anti-infectives (From admission, onward)    Start     Dose/Rate Route Frequency Ordered Stop   07/17/21 1400  cefTRIAXone (ROCEPHIN) 1 g in sodium chloride 0.9 % 100 mL IVPB        1 g 200 mL/hr over 30 Minutes Intravenous Every 24 hours 07/17/21 1320     07/17/21 1000  azithromycin (ZITHROMAX) tablet 250 mg        250 mg Oral Daily 07/16/21 1702     07/16/21 1515  ceFEPIme (MAXIPIME) 2 g in sodium chloride 0.9 % 100 mL IVPB        2 g 200 mL/hr over 30 Minutes Intravenous  Once 07/16/21 1505 07/16/21 1614   07/16/21 1515  azithromycin (ZITHROMAX) 500 mg in sodium chloride 0.9 % 250 mL IVPB        500 mg 250 mL/hr over 60 Minutes Intravenous  Once  07/16/21 1505 07/16/21 1742        Objective: Vitals:   07/19/21 0430 07/19/21 0446 07/19/21 0752 07/19/21 0823  BP:  119/71  118/75  Pulse:  74 75 81  Resp:  20 18 19   Temp:  97.9 F (36.6 C)  97.9 F (36.6 C)  TempSrc:  Oral    SpO2: 95% 93% 91% 93%  Weight:  81.1 kg    Height:        Intake/Output Summary (Last 24 hours) at 07/19/2021 1025 Last data filed at 07/19/2021 0913 Gross per 24 hour  Intake 720 ml  Output 1500 ml  Net -780 ml    Filed Weights   07/16/21 1249 07/19/21 0446  Weight: 79.4 kg 81.1 kg    Examination:  General exam: Appears calm and comfortable  Respiratory system: Diminished breath sounds bilaterally without wheezing or conversational dyspnea, on high flow oxygen Cardiovascular system: S1 & S2 heard, RRR. No murmurs. No pedal edema. Gastrointestinal system: Abdomen is nondistended, soft and nontender. Normal bowel sounds heard. Central nervous system: Alert and oriented. No focal neurological deficits. Speech clear.  Extremities: Symmetric in appearance  Skin: No rashes, lesions or ulcers on exposed skin  Psychiatry: Judgement and insight appear normal. Mood & affect appropriate.   Data Reviewed: I have personally reviewed following labs and imaging studies  CBC: Recent Labs  Lab 07/16/21 1301 07/17/21 0623 07/18/21 0504 07/19/21 0353  WBC 17.2* 20.4* 17.8* 14.3*  NEUTROABS 14.3*  --   --   --   HGB 16.0* 14.8 13.9 13.7  HCT 48.5* 46.2* 43.0 43.0  MCV 90.7 91.3 93.7 93.1  PLT 263 292 283 [id number]    Basic Metabolic Panel: Recent Labs  Lab 07/16/21 1301 07/17/21 0103 07/17/21 0623 07/17/21 1132 07/18/21 0504 07/19/21 0353  NA 129* 127*  --  132* 133* 132*  K 4.1 4.1  --  4.0 4.1 4.7  CL 87* 86*  --  85* 88* 87*  CO2 33* 35*  --  38* 39* 42*  GLUCOSE 129* 131*  --  109* 109* 92  BUN 22 36*  --  42* 36* 19  CREATININE 0.65 0.98  --  0.80 0.62 0.60  CALCIUM 8.7* 8.2*  --  8.6* 8.4* 8.3*  MG  --   --  2.3  --   --   --      GFR: Estimated Creatinine Clearance: 79.9 mL/min (by C-G formula based on SCr of 0.6 mg/dL). Liver Function Tests: Recent Labs  Lab 07/16/21 1301  AST 19  ALT 18  ALKPHOS 87  BILITOT 0.7  PROT 7.2  ALBUMIN 3.6    No results for input(s): LIPASE, AMYLASE in the last 168 hours. No results for input(s): AMMONIA in the last 168 hours. Coagulation Profile: Recent Labs  Lab 07/16/21 1301  INR 1.1    Cardiac Enzymes: No results for input(s): CKTOTAL, CKMB, CKMBINDEX, TROPONINI in the last 168 hours. BNP (last  3 results) No results for input(s): PROBNP in the last 8760 hours. HbA1C: Recent Labs    07/17/21 0623  HGBA1C 5.8*    CBG: No results for input(s): GLUCAP in the last 168 hours. Lipid Profile: Recent Labs    07/17/21 0623  CHOL 141  HDL 31*  LDLCALC 91  TRIG 93  CHOLHDL 4.5    Thyroid Function Tests: No results for input(s): TSH, T4TOTAL, FREET4, T3FREE, THYROIDAB in the last 72 hours. Anemia Panel: No results for input(s): VITAMINB12, FOLATE, FERRITIN, TIBC, IRON, RETICCTPCT in the last 72 hours. Sepsis Labs: Recent Labs  Lab 07/16/21 1405 07/16/21 1552  PROCALCITON  --  0.14  LATICACIDVEN 1.0  --      Recent Results (from the past 240 hour(s))  Expectorated Sputum Assessment w Gram Stain, Rflx to Resp Cult     Status: None   Collection Time: 07/16/21  5:19 AM   Specimen: Sputum  Result Value Ref Range Status   Specimen Description SPUTUM  Final   Special Requests NONE  Final   Sputum evaluation   Final    THIS SPECIMEN IS ACCEPTABLE FOR SPUTUM CULTURE Performed at [facility], [address] [place], [place] [address]    Report Status 07/18/2021 FINAL  Final  Culture, Respiratory w Gram Stain     Status: None (Preliminary result)   Collection Time: 07/16/21  5:19 AM   Specimen: SPU  Result Value Ref Range Status   Specimen Description   Final    SPUTUM Performed at [facility], [address]  [place], [place] [address]    Special Requests   Final    NONE Reflexed from [id number] Performed at [facility], [address] [place], [place] [address]    Gram Stain   Final    RARE WBC PRESENT, PREDOMINANTLY PMN FEW GRAM POSITIVE COCCI IN PAIRS Performed at [facility], [place] [address], [place], [place] [address]    Culture PENDING  Incomplete   Report Status PENDING  Incomplete  Blood culture (routine x 2)     Status: None (Preliminary result)   Collection Time: 07/16/21  1:33 PM   Specimen: BLOOD  Result Value Ref Range Status   Specimen Description BLOOD BLOOD RIGHT FOREARM  Final   Special Requests   Final    BOTTLES DRAWN AEROBIC AND ANAEROBIC Blood Culture adequate volume   Culture   Final    NO GROWTH 3 DAYS Performed at [facility], [address] [place], [place] [address]    Report Status PENDING  Incomplete  Blood culture (routine x 2)     Status: None (Preliminary result)   Collection Time: 07/16/21  1:38 PM   Specimen: BLOOD  Result Value Ref Range Status   Specimen Description BLOOD BLOOD LEFT FOREARM  Final   Special Requests   Final    BOTTLES DRAWN AEROBIC AND ANAEROBIC Blood Culture adequate volume   Culture   Final    NO GROWTH 3 DAYS Performed at [facility], [address] [place], [place] [address]    Report Status PENDING  Incomplete  Resp Panel by RT-PCR (Flu A&B, Covid) Nasopharyngeal Swab     Status: None   Collection Time: 07/16/21  2:05 PM   Specimen: Nasopharyngeal Swab; Nasopharyngeal(NP) swabs in vial transport medium  Result Value Ref Range Status   SARS Coronavirus 2 by RT PCR NEGATIVE NEGATIVE Final    Comment: (NOTE) SARS-CoV-2 target nucleic acids are  NOT DETECTED.  The SARS-CoV-2 RNA is generally detectable in upper respiratory specimens during the acute phase of infection. The lowest concentration of SARS-CoV-2 viral copies this assay can detect is 138 copies/mL. A negative result does not  preclude SARS-Cov-2 infection and should not be used as the sole basis for treatment or other patient management decisions. A negative result may occur with  improper specimen collection/handling, submission of specimen other than nasopharyngeal swab, presence of viral mutation(s) within the areas targeted by this assay, and inadequate number of viral copies(<138 copies/mL). A negative result must be combined with clinical observations, patient history, and epidemiological information. The expected result is Negative.  Fact Sheet for Patients:  EntrepreneurPulse.com.au  Fact Sheet for Healthcare Providers:  IncredibleEmployment.be  This test is no t yet approved or cleared by the [place] FDA and  has been authorized for detection and/or diagnosis of SARS-CoV-2 by FDA under an Emergency Use Authorization (EUA). This EUA will remain  in effect (meaning this test can be used) for the duration of the COVID-19 declaration under Section 564(b)(1) of the Act, 21 U.S.C.section 360bbb-3(b)(1), unless the authorization is terminated  or revoked sooner.       Influenza A by PCR NEGATIVE NEGATIVE Final   Influenza B by PCR NEGATIVE NEGATIVE Final    Comment: (NOTE) The Xpert Xpress SARS-CoV-2/FLU/RSV plus assay is intended as an aid in the diagnosis of influenza from Nasopharyngeal swab specimens and should not be used as a sole basis for treatment. Nasal washings and aspirates are unacceptable for Xpert Xpress SARS-CoV-2/FLU/RSV testing.  Fact Sheet for Patients: EntrepreneurPulse.com.au  Fact Sheet for Healthcare Providers: IncredibleEmployment.be  This test is not yet approved or cleared by the [place] FDA and has been authorized for detection and/or diagnosis of SARS-CoV-2 by FDA under an Emergency Use Authorization (EUA). This EUA will remain in effect (meaning this test can be used) for the  duration of the COVID-19 declaration under Section 564(b)(1) of the Act, 21 U.S.C. section 360bbb-3(b)(1), unless the authorization is terminated or revoked.  Performed at [facility], [address], [place], [place] [address]        Radiology Studies: ECHOCARDIOGRAM COMPLETE  Result Date: 07/17/2021    ECHOCARDIOGRAM REPORT   Patient Name:   Virginia Norman Date of Exam: 07/17/2021 Medical Rec #:  [id number]      Height:       67.0 in Accession #:    [id number]     Weight:       175.0 lb Date of Birth:  1961-05-06     BSA:          1.911 m Patient Age:    1 years       BP:           107/58 mmHg Patient Gender: F              HR:           75 bpm. Exam Location:  [facility] Procedure: 2D Echo, Color Doppler, Cardiac Doppler and Intracardiac            Opacification Agent Indications:     I50.31 congestive heart failure-Acute Diastolic  History:         Patient has prior history of Echocardiogram examinations. CAD,                  COPD; Risk Factors:Hypertension.  Sonographer:     Charmayne Sheer Referring Phys:  [place] Diagnosing Phys: Thurmond Butts  Corky Sox  Sonographer Comments: Technically difficult study due to poor echo windows. Image acquisition challenging due to COPD and Image acquisition challenging due to respiratory motion. IMPRESSIONS  1. Left ventricular ejection fraction, by estimation, is 65 to 70%. The left ventricle has normal function. The left ventricle has no regional wall motion abnormalities. There is mild left ventricular hypertrophy. Left ventricular diastolic parameters are consistent with Grade II diastolic dysfunction (pseudonormalization).  2. Right ventricular systolic function is normal. The right ventricular size is normal.  3. Left atrial size was severely dilated.  4. The mitral valve is grossly normal. Mild mitral valve regurgitation. Mild mitral stenosis.  5. The aortic valve is calcified. Aortic valve regurgitation is not visualized. Aortic valve  sclerosis/calcification is present, without any evidence of aortic stenosis.  6. The inferior vena cava is normal in size with <50% respiratory variability, suggesting right atrial pressure of 8 mmHg. FINDINGS  Left Ventricle: Left ventricular ejection fraction, by estimation, is 65 to 70%. The left ventricle has normal function. The left ventricle has no regional wall motion abnormalities. Definity contrast agent was given IV to delineate the left ventricular  endocardial borders. The left ventricular internal cavity size was normal in size. There is mild left ventricular hypertrophy. Left ventricular diastolic parameters are consistent with Grade II diastolic dysfunction (pseudonormalization). Right Ventricle: The right ventricular size is normal. No increase in right ventricular wall thickness. Right ventricular systolic function is normal. Left Atrium: Left atrial size was severely dilated. Right Atrium: Right atrial size was normal in size. Pericardium: There is no evidence of pericardial effusion. Mitral Valve: The mitral valve is grossly normal. Mild mitral valve regurgitation. Mild mitral valve stenosis. MV peak gradient, 9.6 mmHg. The mean mitral valve gradient is 5.0 mmHg. Tricuspid Valve: The tricuspid valve is not well visualized. Tricuspid valve regurgitation is not demonstrated. Aortic Valve: The aortic valve is calcified. Aortic valve regurgitation is not visualized. Aortic valve sclerosis/calcification is present, without any evidence of aortic stenosis. Aortic valve mean gradient measures 14.0 mmHg. Aortic valve peak gradient  measures 22.1 mmHg. Aortic valve area, by VTI measures 3.48 cm. Pulmonic Valve: The pulmonic valve was not well visualized. Aorta: The aortic root is normal in size and structure. Venous: The inferior vena cava is normal in size with less than 50% respiratory variability, suggesting right atrial pressure of 8 mmHg. IAS/Shunts: The interatrial septum was not well visualized.   LEFT VENTRICLE PLAX 2D LVIDd:         3.80 cm      Diastology LV PW:         1.31 cm      LV e' medial:    8.59 cm/s LV IVS:        1.27 cm      LV E/e' medial:  15.0 LVOT diam:     2.30 cm      LV e' lateral:   9.14 cm/s LV SV:         173          LV E/e' lateral: 14.1 LV SV Index:   91 LVOT Area:     4.15 cm  LV Volumes (MOD) LV vol d, MOD A2C: 123.0 ml LV vol d, MOD A4C: 169.0 ml LV vol s, MOD A2C: 34.7 ml LV vol s, MOD A4C: 37.1 ml LV SV MOD A2C:     88.3 ml LV SV MOD A4C:     169.0 ml LV SV MOD BP:  109.6 ml RIGHT VENTRICLE RV Basal diam:  4.14 cm LEFT ATRIUM              Index        RIGHT ATRIUM           Index LA Vol (A2C):   101.0 ml 52.86 ml/m  RA Area:     15.30 cm LA Vol (A4C):   114.0 ml 59.66 ml/m  RA Volume:   43.50 ml  22.77 ml/m LA Biplane Vol: 112.0 ml 58.62 ml/m  AORTIC VALVE                     PULMONIC VALVE AV Area (Vmax):    3.75 cm      PV Vmax:       1.14 m/s AV Area (Vmean):   3.64 cm      PV Vmean:      75.800 cm/s AV Area (VTI):     3.48 cm      PV VTI:        0.234 m AV Vmax:           235.00 cm/s   PV Peak grad:  5.2 mmHg AV Vmean:          178.000 cm/s  PV Mean grad:  3.0 mmHg AV VTI:            0.498 m AV Peak Grad:      22.1 mmHg AV Mean Grad:      14.0 mmHg LVOT Vmax:         212.00 cm/s LVOT Vmean:        156.000 cm/s LVOT VTI:          0.417 m LVOT/AV VTI ratio: 0.84  AORTA Ao Root diam: 3.00 cm MITRAL VALVE MV Area (PHT): 2.68 cm     SHUNTS MV Area VTI:   4.59 cm     Systemic VTI:  0.42 m MV Peak grad:  9.6 mmHg     Systemic Diam: 2.30 cm MV Mean grad:  5.0 mmHg MV Vmax:       1.55 m/s MV Vmean:      101.0 cm/s MV Decel Time: 283 msec MV E velocity: 129.00 cm/s MV A velocity: 105.00 cm/s MV E/A ratio:  1.23 Donnelly Angelica Electronically signed by Donnelly Angelica Signature Date/Time: 07/17/2021/1:10:19 PM    Final       Scheduled Meds:  vitamin C  500 mg Oral Daily   aspirin EC  81 mg Oral Daily   azithromycin  250 mg Oral Daily   enoxaparin (LOVENOX) injection  40  mg Subcutaneous QHS   ipratropium-albuterol  3 mL Nebulization Q4H   methylPREDNISolone (SOLU-MEDROL) injection  80 mg Intravenous Daily   midodrine  5 mg Oral TID WC   multivitamin with minerals  1 tablet Oral Daily   Continuous Infusions:  cefTRIAXone (ROCEPHIN)  IV 1 g (07/18/21 1424)     LOS: 3 days     Dessa Phi, DO [facility] 07/19/2021, 10:25 AM   Available via Epic secure chat 7am-7pm After these hours, please refer to coverage provider listed on [email or web address]

## 2021-07-20 LAB — CULTURE, RESPIRATORY W GRAM STAIN: Culture: NORMAL

## 2021-07-20 MED ORDER — MIDODRINE HCL 5 MG PO TABS
2.5000 mg | ORAL_TABLET | Freq: Three times a day (TID) | ORAL | Status: DC
  Administered 2021-07-20 (×2): 2.5 mg via ORAL
  Filled 2021-07-20 (×2): qty 1

## 2021-07-20 MED ORDER — PREDNISONE 50 MG PO TABS
50.0000 mg | ORAL_TABLET | Freq: Every day | ORAL | Status: DC
  Administered 2021-07-21 – 2021-07-22 (×2): 50 mg via ORAL
  Filled 2021-07-20 (×2): qty 1

## 2021-07-20 MED ORDER — IPRATROPIUM-ALBUTEROL 0.5-2.5 (3) MG/3ML IN SOLN
3.0000 mL | Freq: Four times a day (QID) | RESPIRATORY_TRACT | Status: DC
  Administered 2021-07-20 – 2021-07-22 (×9): 3 mL via RESPIRATORY_TRACT
  Filled 2021-07-20 (×9): qty 3

## 2021-07-20 MED ORDER — ALBUTEROL SULFATE (2.5 MG/3ML) 0.083% IN NEBU: 2.5000 mg | INHALATION_SOLUTION | RESPIRATORY_TRACT | Status: DC | PRN

## 2021-07-20 MED ORDER — SODIUM CHLORIDE 0.9 % IV SOLN
INTRAVENOUS | Status: DC | PRN
  Administered 2021-07-20: 10 mL/h via INTRAVENOUS

## 2021-07-20 NOTE — Progress Notes (Signed)
PROGRESS NOTE    Virginia Norman  [id number] DOB: October 15, 1960 DOA: 07/16/2021 PCP: [facility] [place]     Brief Narrative:  Virginia Norman is a  61 y.o. female with medical history significant of COPD not on oxygen normally, hypertension, CAD, dCHF, former smoker, who presents with shortness breath.   Patient states that she has SOB for more than 3 days, which has been progressively worsening.  Patient has productive cough with dark-colored sputum production.  She also has generalized weakness, poor appetite, decreased oral intake.  Patient states she had some mild left-sided sharp chest pain earlier, which has resolved. Per report, patient had oxygen desaturating to 80s in the morning. Patient was found to have severe respiratory distress and could not speak in full sentence.  BiPAP was started. Patient was found to have WBC 17.2, troponin level 89 --> 93, BNP 517, negative COVID PCR, procalcitonin 0.14, lactic acid 1.0, sodium 129, renal function okay, temperature 98.1, heart rate 105, RR 44.  Chest x-ray showed mild pulmonary edema and cardiomegaly.  VBG with pH 7.36, CO2 75, O2 76.  Patient was admitted and started on BiPAP.   Overnight, patient had episodes of hypotension.  She was taken off BiPAP and placed on high flow nasal cannula O2.  She was given IV fluid bolus with improvement in her blood pressure.  New events last 24 hours / Subjective: Patient was weaned down to 6L O2 this morning. Feeling well, denies SOB. Has been able to ambulate around the room on her own.   Assessment & Plan:   Principal Problem:   Acute respiratory failure with hypoxia and hypercapnia (HCC) Active Problems:   Benign essential HTN   CAD (coronary artery disease)   COPD exacerbation (HCC)   Acute on chronic diastolic CHF (congestive heart failure) (HCC)   Elevated troponin   Hyponatremia   Sepsis (HCC)   Acute on chronic diastolic (congestive) heart failure (HCC)   Acute hypoxemic  and hypercapnic respiratory failure -Secondary to COPD exacerbation, CAP  -Found to have O2 sat 58% on room air in the ED  -Off BiPAP, currently on 6L O2  -Goal SPO2 88 to 92% wean as able  COPD exacerbation, CAP -Continue Solu-Medrol --> wean to prednisone starting tomorrow, azithromycin, rocephin, breathing treatments -CTA chest negative for PE, did show airspace opacities bilateral upper lobes  Chronic diastolic heart failure -Without evidence of acute exacerbation at this time.  Hold off on further diuresis given no peripheral edema as well as hypotension  Hypotension -Continue midodrine, wean today    Demand ischemia -Troponin 89 >> 93 -Without concerning changes in EKG, currently chest pain-free  -Echocardiogram showed EF 65 to 70%, no regional wall motion abnormalities, grade 2 diastolic dysfunction -Stop IV heparin -Continue aspirin   Hyponatremia -Hold HCTZ and monitor BMP -Improved  DVT prophylaxis:  enoxaparin (LOVENOX) injection 40 mg Start: 07/17/21 2200  Code Status: Full code Family Communication: No family at bedside Disposition Plan:  Status is: Inpatient  Remains inpatient appropriate because: resp failure requiring O2     Antimicrobials:  Anti-infectives (From admission, onward)    Start     Dose/Rate Route Frequency Ordered Stop   07/17/21 1400  cefTRIAXone (ROCEPHIN) 1 g in sodium chloride 0.9 % 100 mL IVPB        1 g 200 mL/hr over 30 Minutes Intravenous Every 24 hours 07/17/21 1320     07/17/21 1000  azithromycin (ZITHROMAX) tablet 250 mg  250 mg Oral Daily 07/16/21 1702     07/16/21 1515  ceFEPIme (MAXIPIME) 2 g in sodium chloride 0.9 % 100 mL IVPB        2 g 200 mL/hr over 30 Minutes Intravenous  Once 07/16/21 1505 07/16/21 1614   07/16/21 1515  azithromycin (ZITHROMAX) 500 mg in sodium chloride 0.9 % 250 mL IVPB        500 mg 250 mL/hr over 60 Minutes Intravenous  Once 07/16/21 1505 07/16/21 1742        Objective: Vitals:    07/20/21 0500 07/20/21 0752 07/20/21 0755 07/20/21 0855  BP:  123/73    Pulse:  79 78   Resp:  18 18   Temp:  97.8 F (36.6 C)    TempSrc:      SpO2:  98% 100% 94%  Weight: 82.9 kg     Height:        Intake/Output Summary (Last 24 hours) at 07/20/2021 0925 Last data filed at 07/19/2021 2000 Gross per 24 hour  Intake 1260 ml  Output 1300 ml  Net -40 ml    Filed Weights   07/16/21 1249 07/19/21 0446 07/20/21 0500  Weight: 79.4 kg 81.1 kg 82.9 kg    Examination:  General exam: Appears calm and comfortable  Respiratory system: some minimal wheezes, no resp distress, no conversational dyspnea  Cardiovascular system: S1 & S2 heard, RRR. No murmurs. No pedal edema. Gastrointestinal system: Abdomen is nondistended, soft and nontender. Normal bowel sounds heard. Central nervous system: Alert and oriented Extremities: Symmetric in appearance  Skin: No rashes, lesions or ulcers on exposed skin  Psychiatry: Judgement and insight appear normal. Mood & affect appropriate.   Data Reviewed: I have personally reviewed following labs and imaging studies  CBC: Recent Labs  Lab 07/16/21 1301 07/17/21 0623 07/18/21 0504 07/19/21 0353  WBC 17.2* 20.4* 17.8* 14.3*  NEUTROABS 14.3*  --   --   --   HGB 16.0* 14.8 13.9 13.7  HCT 48.5* 46.2* 43.0 43.0  MCV 90.7 91.3 93.7 93.1  PLT 263 292 283 [id number]    Basic Metabolic Panel: Recent Labs  Lab 07/16/21 1301 07/17/21 0103 07/17/21 0623 07/17/21 1132 07/18/21 0504 07/19/21 0353  NA 129* 127*  --  132* 133* 132*  K 4.1 4.1  --  4.0 4.1 4.7  CL 87* 86*  --  85* 88* 87*  CO2 33* 35*  --  38* 39* 42*  GLUCOSE 129* 131*  --  109* 109* 92  BUN 22 36*  --  42* 36* 19  CREATININE 0.65 0.98  --  0.80 0.62 0.60  CALCIUM 8.7* 8.2*  --  8.6* 8.4* 8.3*  MG  --   --  2.3  --   --   --     GFR: Estimated Creatinine Clearance: 80.7 mL/min (by C-G formula based on SCr of 0.6 mg/dL). Liver Function Tests: Recent Labs  Lab 07/16/21 1301  AST  19  ALT 18  ALKPHOS 87  BILITOT 0.7  PROT 7.2  ALBUMIN 3.6    No results for input(s): LIPASE, AMYLASE in the last 168 hours. No results for input(s): AMMONIA in the last 168 hours. Coagulation Profile: Recent Labs  Lab 07/16/21 1301  INR 1.1    Cardiac Enzymes: No results for input(s): CKTOTAL, CKMB, CKMBINDEX, TROPONINI in the last 168 hours. BNP (last 3 results) No results for input(s): PROBNP in the last 8760 hours. HbA1C: No results for input(s): HGBA1C in the last  72 hours.  CBG: No results for input(s): GLUCAP in the last 168 hours. Lipid Profile: No results for input(s): CHOL, HDL, LDLCALC, TRIG, CHOLHDL, LDLDIRECT in the last 72 hours.  Thyroid Function Tests: No results for input(s): TSH, T4TOTAL, FREET4, T3FREE, THYROIDAB in the last 72 hours. Anemia Panel: No results for input(s): VITAMINB12, FOLATE, FERRITIN, TIBC, IRON, RETICCTPCT in the last 72 hours. Sepsis Labs: Recent Labs  Lab 07/16/21 1405 07/16/21 1552  PROCALCITON  --  0.14  LATICACIDVEN 1.0  --      Recent Results (from the past 240 hour(s))  Expectorated Sputum Assessment w Gram Stain, Rflx to Resp Cult     Status: None   Collection Time: 07/16/21  5:19 AM   Specimen: Sputum  Result Value Ref Range Status   Specimen Description SPUTUM  Final   Special Requests NONE  Final   Sputum evaluation   Final    THIS SPECIMEN IS ACCEPTABLE FOR SPUTUM CULTURE Performed at [facility], [address], [place], [place] [address]    Report Status 07/18/2021 FINAL  Final  Culture, Respiratory w Gram Stain     Status: None (Preliminary result)   Collection Time: 07/16/21  5:19 AM   Specimen: SPU  Result Value Ref Range Status   Specimen Description   Final    SPUTUM Performed at [facility], [address], [place], [place] [address]    Special Requests   Final    NONE Reflexed from [id number] Performed at [facility], [address] [place], [place] [address]     Gram Stain   Final    RARE WBC PRESENT, PREDOMINANTLY PMN FEW GRAM POSITIVE COCCI IN PAIRS    Culture   Final    CULTURE REINCUBATED FOR BETTER GROWTH Performed at [facility], [address], [place], [place] [address]    Report Status PENDING  Incomplete  Blood culture (routine x 2)     Status: None (Preliminary result)   Collection Time: 07/16/21  1:33 PM   Specimen: BLOOD  Result Value Ref Range Status   Specimen Description BLOOD BLOOD RIGHT FOREARM  Final   Special Requests   Final    BOTTLES DRAWN AEROBIC AND ANAEROBIC Blood Culture adequate volume   Culture   Final    NO GROWTH 4 DAYS Performed at [facility], [address], [place], [place] [address]    Report Status PENDING  Incomplete  Blood culture (routine x 2)     Status: None (Preliminary result)   Collection Time: 07/16/21  1:38 PM   Specimen: BLOOD  Result Value Ref Range Status   Specimen Description BLOOD BLOOD LEFT FOREARM  Final   Special Requests   Final    BOTTLES DRAWN AEROBIC AND ANAEROBIC Blood Culture adequate volume   Culture   Final    NO GROWTH 4 DAYS Performed at [facility], [address], [place], [place] [address]    Report Status PENDING  Incomplete  Resp Panel by RT-PCR (Flu A&B, Covid) Nasopharyngeal Swab     Status: None   Collection Time: 07/16/21  2:05 PM   Specimen: Nasopharyngeal Swab; Nasopharyngeal(NP) swabs in vial transport medium  Result Value Ref Range Status   SARS Coronavirus 2 by RT PCR NEGATIVE NEGATIVE Final    Comment: (NOTE) SARS-CoV-2 target nucleic acids are NOT DETECTED.  The SARS-CoV-2 RNA is generally detectable in upper respiratory specimens during the acute phase of infection. The lowest concentration of  SARS-CoV-2 viral copies this assay can detect is 138 copies/mL. A negative result does not preclude SARS-Cov-2 infection and should not be used as the sole basis for treatment or other patient management decisions. A negative  result may occur with  improper specimen collection/handling, submission of specimen other than nasopharyngeal swab, presence of viral mutation(s) within the areas targeted by this assay, and inadequate number of viral copies(<138 copies/mL). A negative result must be combined with clinical observations, patient history, and epidemiological information. The expected result is Negative.  Fact Sheet for Patients:  EntrepreneurPulse.com.au  Fact Sheet for Healthcare Providers:  IncredibleEmployment.be  This test is no t yet approved or cleared by the [place] FDA and  has been authorized for detection and/or diagnosis of SARS-CoV-2 by FDA under an Emergency Use Authorization (EUA). This EUA will remain  in effect (meaning this test can be used) for the duration of the COVID-19 declaration under Section 564(b)(1) of the Act, 21 U.S.C.section 360bbb-3(b)(1), unless the authorization is terminated  or revoked sooner.       Influenza A by PCR NEGATIVE NEGATIVE Final   Influenza B by PCR NEGATIVE NEGATIVE Final    Comment: (NOTE) The Xpert Xpress SARS-CoV-2/FLU/RSV plus assay is intended as an aid in the diagnosis of influenza from Nasopharyngeal swab specimens and should not be used as a sole basis for treatment. Nasal washings and aspirates are unacceptable for Xpert Xpress SARS-CoV-2/FLU/RSV testing.  Fact Sheet for Patients: EntrepreneurPulse.com.au  Fact Sheet for Healthcare Providers: IncredibleEmployment.be  This test is not yet approved or cleared by the [place] FDA and has been authorized for detection and/or diagnosis of SARS-CoV-2 by FDA under an Emergency Use Authorization (EUA). This EUA will remain in effect (meaning this test can be used) for the duration of the COVID-19 declaration under Section 564(b)(1) of the Act, 21 U.S.C. section 360bbb-3(b)(1), unless the authorization is  terminated or revoked.  Performed at [facility], [address] [place], [place] [address]        Radiology Studies: No results found.    Scheduled Meds:  vitamin C  500 mg Oral Daily   aspirin EC  81 mg Oral Daily   azithromycin  250 mg Oral Daily   enoxaparin (LOVENOX) injection  40 mg Subcutaneous QHS   ipratropium-albuterol  3 mL Nebulization Q6H   methylPREDNISolone (SOLU-MEDROL) injection  80 mg Intravenous Daily   midodrine  5 mg Oral TID WC   multivitamin with minerals  1 tablet Oral Daily   Continuous Infusions:  cefTRIAXone (ROCEPHIN)  IV 1 g (07/19/21 1318)     LOS: 4 days     Dessa Phi, DO [facility] 07/20/2021, 9:25 AM   Available via Epic secure chat 7am-7pm After these hours, please refer to coverage provider listed on [email or web address]

## 2021-07-20 NOTE — Progress Notes (Signed)
°  Transition of Care [facility]) Screening Note   Patient Details  Name: Lenay Lovejoy Date of Birth: [birth year]   Transition of Care [facility]) CM/SW Contact:    Gildardo Griffes, LCSW Phone Number: 07/20/2021, 2:32 PM  Received [facility] consult for heart failure, messaged heart failure RN Jimsey to inform.   Transition of Care Department [facility]) has reviewed patient and no TOC needs have been identified at this time. We will continue to monitor patient advancement through interdisciplinary progression rounds. If new patient transition needs arise, please place a TOC consult.  [place], [place] [phone]

## 2021-07-21 LAB — CULTURE, BLOOD (ROUTINE X 2)
Culture: NO GROWTH
Culture: NO GROWTH
Special Requests: ADEQUATE
Special Requests: ADEQUATE

## 2021-07-21 MED ORDER — MIDODRINE HCL 5 MG PO TABS
2.5000 mg | ORAL_TABLET | Freq: Two times a day (BID) | ORAL | Status: AC
  Administered 2021-07-21 – 2021-07-22 (×2): 2.5 mg via ORAL
  Filled 2021-07-21 (×2): qty 1

## 2021-07-21 MED ORDER — MIDODRINE HCL 5 MG PO TABS: 2.5000 mg | ORAL_TABLET | Freq: Two times a day (BID) | ORAL | Status: DC

## 2021-07-21 NOTE — Consult Note (Signed)
° °  Heart Failure Nurse Navigator Note  HFpEF 65 to 70%.  Mild LVH.  Grade 2 diastolic dysfunction.  She presented to the emergency room with complaints of worsening shortness of breath for greater than 3 days.  Also had a productive cough of dark sputum.  Also noting weakness and early satiety.  Chest x-ray revealed pulmonary edema and cardiomegaly.  BNP 517.  Comorbidities:  COPD Hypertension Coronary artery disease Previous tobacco abuse  Medications:  Aspirin 81 mg daily   Labs: Sodium 132, potassium 4.7, chloride 87, CO2 42, BUN 19, creatinine 0.6 Blood pressure 146/89 Weight 82.9 kg Intake 1300 mL Output 400 mL   Initial meeting with patient.  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  Resides with her husband.  Discussed heart failure, she states she had never heard that term relationship to her herself having it. Went over the different kinds of heart failure, explained to her what she has.  Also showed illustrations.  Discussed the importance of low-sodium, she does use salt at the table.  Explained the relationship between sodium and fluids in the body.  She voices understanding.  Discussed reading labels and types of foods to avoid.  She does weigh herself daily, went over weights to report to physician along with changes in symptoms.  She was given living with heart failure teaching booklet, information on low-sodium and zone magnet.  We will continue to follow along.  Pricilla Riffle RN [facility]

## 2021-07-21 NOTE — Progress Notes (Addendum)
PROGRESS NOTE    Virginia Norman  [id number] DOB: 1960/11/10 DOA: 07/16/2021 PCP: [facility] [place]     Brief Narrative:  Virginia Norman is a  61 y.o. female with medical history significant of COPD not on oxygen normally, hypertension, CAD, dCHF, former smoker, who presents with shortness breath.   Patient states that she has SOB for more than 3 days, which has been progressively worsening.  Patient has productive cough with dark-colored sputum production.  She also has generalized weakness, poor appetite, decreased oral intake.  Patient states she had some mild left-sided sharp chest pain earlier, which has resolved. Per report, patient had oxygen desaturating to 80s in the morning. Patient was found to have severe respiratory distress and could not speak in full sentence.  BiPAP was started. Patient was found to have WBC 17.2, troponin level 89 --> 93, BNP 517, negative COVID PCR, procalcitonin 0.14, lactic acid 1.0, sodium 129, renal function okay, temperature 98.1, heart rate 105, RR 44.  Chest x-ray showed mild pulmonary edema and cardiomegaly.  VBG with pH 7.36, CO2 75, O2 76.  Patient was admitted and started on BiPAP.   Overnight, patient had episodes of hypotension.  She was taken off BiPAP and placed on high flow nasal cannula O2.  She was given IV fluid bolus with improvement in her blood pressure.  New events last 24 hours / Subjective: She desatted to 80% on room air and was placed on 4 L oxygen.  Patient without any new complaints, complaints of shortness of breath.  Continues to have productive cough.  Was able to ambulate around the room without dyspnea.  Assessment & Plan:   Principal Problem:   Acute respiratory failure with hypoxia and hypercapnia (HCC) Active Problems:   Benign essential HTN   CAD (coronary artery disease)   COPD exacerbation (HCC)   Acute on chronic diastolic CHF (congestive heart failure) (HCC)   Elevated troponin   Hyponatremia    Sepsis (HCC)   Acute on chronic diastolic (congestive) heart failure (HCC)   Acute hypoxemic and hypercapnic respiratory failure -Secondary to COPD exacerbation, CAP  -Found to have O2 sat 58% on room air in the ED  -Off BiPAP, currently on 4L O2  -Goal SPO2 88 to 92% wean as able -She will need oxygen desaturation screening prior to discharge  COPD exacerbation, CAP -Continue prednisone, breathing treatments -Completed 5 days antibiotics -CTA chest negative for PE, did show airspace opacities bilateral upper lobes  Chronic diastolic heart failure -Without evidence of acute exacerbation at this time.  Hold off on further diuresis given no peripheral edema as well as hypotension  Hypotension -Continue midodrine, continue to wean today    Demand ischemia -Troponin 89 >> 93 -Without concerning changes in EKG, currently chest pain-free  -Echocardiogram showed EF 65 to 70%, no regional wall motion abnormalities, grade 2 diastolic dysfunction -Stop IV heparin -Continue aspirin   Hyponatremia -Hold HCTZ and monitor BMP -Improved  DVT prophylaxis:  enoxaparin (LOVENOX) injection 40 mg Start: 07/17/21 2200  Code Status: Full code Family Communication: Friend at bedside  Disposition Plan:  Status is: Inpatient  Remains inpatient appropriate because: resp failure requiring O2     Antimicrobials:  Anti-infectives (From admission, onward)    Start     Dose/Rate Route Frequency Ordered Stop   07/17/21 1400  cefTRIAXone (ROCEPHIN) 1 g in sodium chloride 0.9 % 100 mL IVPB        1 g 200 mL/hr over 30 Minutes Intravenous Every  24 hours 07/17/21 1320     07/17/21 1000  azithromycin (ZITHROMAX) tablet 250 mg        250 mg Oral Daily 07/16/21 1702     07/16/21 1515  ceFEPIme (MAXIPIME) 2 g in sodium chloride 0.9 % 100 mL IVPB        2 g 200 mL/hr over 30 Minutes Intravenous  Once 07/16/21 1505 07/16/21 1614   07/16/21 1515  azithromycin (ZITHROMAX) 500 mg in sodium chloride 0.9 %  250 mL IVPB        500 mg 250 mL/hr over 60 Minutes Intravenous  Once 07/16/21 1505 07/16/21 1742        Objective: Vitals:   07/21/21 0437 07/21/21 0741 07/21/21 0825 07/21/21 0902  BP: 133/64 132/80    Pulse: 72 78 80   Resp: 18 18 16    Temp: (!) 97.3 F (36.3 C) 97.7 F (36.5 C)    TempSrc: Oral     SpO2: 95% 94% (!) 80% 90%  Weight:      Height:        Intake/Output Summary (Last 24 hours) at 07/21/2021 1016 Last data filed at 07/20/2021 2011 Gross per 24 hour  Intake 1060 ml  Output 400 ml  Net 660 ml    Filed Weights   07/16/21 1249 07/19/21 0446 07/20/21 0500  Weight: 79.4 kg 81.1 kg 82.9 kg    Examination:  General exam: Appears calm and comfortable  Respiratory system: diminished breath sounds, no conversational dyspnea, on 4L O2  Cardiovascular system: S1 & S2 heard, RRR. No murmurs. No pedal edema. Gastrointestinal system: Abdomen is nondistended, soft and nontender. Normal bowel sounds heard. Central nervous system: Alert and oriented Extremities: Symmetric in appearance  Skin: No rashes, lesions or ulcers on exposed skin  Psychiatry: Judgement and insight appear normal. Mood & affect appropriate.   Data Reviewed: I have personally reviewed following labs and imaging studies  CBC: Recent Labs  Lab 07/16/21 1301 07/17/21 0623 07/18/21 0504 07/19/21 0353  WBC 17.2* 20.4* 17.8* 14.3*  NEUTROABS 14.3*  --   --   --   HGB 16.0* 14.8 13.9 13.7  HCT 48.5* 46.2* 43.0 43.0  MCV 90.7 91.3 93.7 93.1  PLT 263 292 283 [id number]    Basic Metabolic Panel: Recent Labs  Lab 07/16/21 1301 07/17/21 0103 07/17/21 0623 07/17/21 1132 07/18/21 0504 07/19/21 0353  NA 129* 127*  --  132* 133* 132*  K 4.1 4.1  --  4.0 4.1 4.7  CL 87* 86*  --  85* 88* 87*  CO2 33* 35*  --  38* 39* 42*  GLUCOSE 129* 131*  --  109* 109* 92  BUN 22 36*  --  42* 36* 19  CREATININE 0.65 0.98  --  0.80 0.62 0.60  CALCIUM 8.7* 8.2*  --  8.6* 8.4* 8.3*  MG  --   --  2.3  --   --   --      GFR: Estimated Creatinine Clearance: 80.7 mL/min (by C-G formula based on SCr of 0.6 mg/dL). Liver Function Tests: Recent Labs  Lab 07/16/21 1301  AST 19  ALT 18  ALKPHOS 87  BILITOT 0.7  PROT 7.2  ALBUMIN 3.6    No results for input(s): LIPASE, AMYLASE in the last 168 hours. No results for input(s): AMMONIA in the last 168 hours. Coagulation Profile: Recent Labs  Lab 07/16/21 1301  INR 1.1    Cardiac Enzymes: No results for input(s): CKTOTAL, CKMB, CKMBINDEX, TROPONINI in  the last 168 hours. BNP (last 3 results) No results for input(s): PROBNP in the last 8760 hours. HbA1C: No results for input(s): HGBA1C in the last 72 hours.  CBG: No results for input(s): GLUCAP in the last 168 hours. Lipid Profile: No results for input(s): CHOL, HDL, LDLCALC, TRIG, CHOLHDL, LDLDIRECT in the last 72 hours.  Thyroid Function Tests: No results for input(s): TSH, T4TOTAL, FREET4, T3FREE, THYROIDAB in the last 72 hours. Anemia Panel: No results for input(s): VITAMINB12, FOLATE, FERRITIN, TIBC, IRON, RETICCTPCT in the last 72 hours. Sepsis Labs: Recent Labs  Lab 07/16/21 1405 07/16/21 1552  PROCALCITON  --  0.14  LATICACIDVEN 1.0  --      Recent Results (from the past 240 hour(s))  Expectorated Sputum Assessment w Gram Stain, Rflx to Resp Cult     Status: None   Collection Time: 07/16/21  5:19 AM   Specimen: Sputum  Result Value Ref Range Status   Specimen Description SPUTUM  Final   Special Requests NONE  Final   Sputum evaluation   Final    THIS SPECIMEN IS ACCEPTABLE FOR SPUTUM CULTURE Performed at [facility], [address], [place], [place] [address]    Report Status 07/18/2021 FINAL  Final  Culture, Respiratory w Gram Stain     Status: None   Collection Time: 07/16/21  5:19 AM   Specimen: SPU  Result Value Ref Range Status   Specimen Description   Final    SPUTUM Performed at [facility], [address] [place], [place] [address]     Special Requests   Final    NONE Reflexed from [id number] Performed at [facility], [address] [place], [place] [address]    Gram Stain   Final    RARE WBC PRESENT, PREDOMINANTLY PMN FEW GRAM POSITIVE COCCI IN PAIRS    Culture   Final    FEW Normal respiratory flora-no Staph aureus or Pseudomonas seen Performed at [facility], [place] [address], [place], [place] [address]    Report Status 07/20/2021 FINAL  Final  Blood culture (routine x 2)     Status: None   Collection Time: 07/16/21  1:33 PM   Specimen: BLOOD  Result Value Ref Range Status   Specimen Description BLOOD BLOOD RIGHT FOREARM  Final   Special Requests   Final    BOTTLES DRAWN AEROBIC AND ANAEROBIC Blood Culture adequate volume   Culture   Final    NO GROWTH 5 DAYS Performed at [facility], [address] [place], [place] [address]    Report Status 07/21/2021 FINAL  Final  Blood culture (routine x 2)     Status: None   Collection Time: 07/16/21  1:38 PM   Specimen: BLOOD  Result Value Ref Range Status   Specimen Description BLOOD BLOOD LEFT FOREARM  Final   Special Requests   Final    BOTTLES DRAWN AEROBIC AND ANAEROBIC Blood Culture adequate volume   Culture   Final    NO GROWTH 5 DAYS Performed at [facility], [address] [place], [place] [address]    Report Status 07/21/2021 FINAL  Final  Resp Panel by RT-PCR (Flu A&B, Covid) Nasopharyngeal Swab     Status: None   Collection Time: 07/16/21  2:05 PM   Specimen: Nasopharyngeal Swab; Nasopharyngeal(NP) swabs in vial transport medium  Result Value Ref Range Status   SARS Coronavirus 2 by RT PCR NEGATIVE NEGATIVE Final    Comment: (NOTE)  SARS-CoV-2 target nucleic acids are NOT DETECTED.  The SARS-CoV-2 RNA is generally detectable in upper respiratory specimens during the acute phase of infection. The lowest concentration of SARS-CoV-2 viral copies this assay can detect is 138 copies/mL. A negative result does not  preclude SARS-Cov-2 infection and should not be used as the sole basis for treatment or other patient management decisions. A negative result may occur with  improper specimen collection/handling, submission of specimen other than nasopharyngeal swab, presence of viral mutation(s) within the areas targeted by this assay, and inadequate number of viral copies(<138 copies/mL). A negative result must be combined with clinical observations, patient history, and epidemiological information. The expected result is Negative.  Fact Sheet for Patients:  EntrepreneurPulse.com.au  Fact Sheet for Healthcare Providers:  IncredibleEmployment.be  This test is no t yet approved or cleared by the [place] FDA and  has been authorized for detection and/or diagnosis of SARS-CoV-2 by FDA under an Emergency Use Authorization (EUA). This EUA will remain  in effect (meaning this test can be used) for the duration of the COVID-19 declaration under Section 564(b)(1) of the Act, 21 U.S.C.section 360bbb-3(b)(1), unless the authorization is terminated  or revoked sooner.       Influenza A by PCR NEGATIVE NEGATIVE Final   Influenza B by PCR NEGATIVE NEGATIVE Final    Comment: (NOTE) The Xpert Xpress SARS-CoV-2/FLU/RSV plus assay is intended as an aid in the diagnosis of influenza from Nasopharyngeal swab specimens and should not be used as a sole basis for treatment. Nasal washings and aspirates are unacceptable for Xpert Xpress SARS-CoV-2/FLU/RSV testing.  Fact Sheet for Patients: EntrepreneurPulse.com.au  Fact Sheet for Healthcare Providers: IncredibleEmployment.be  This test is not yet approved or cleared by the [place] FDA and has been authorized for detection and/or diagnosis of SARS-CoV-2 by FDA under an Emergency Use Authorization (EUA). This EUA will remain in effect (meaning this test can be used) for the  duration of the COVID-19 declaration under Section 564(b)(1) of the Act, 21 U.S.C. section 360bbb-3(b)(1), unless the authorization is terminated or revoked.  Performed at [facility], [address], [place], [place] [address]        Radiology Studies: No results found.    Scheduled Meds:  vitamin C  500 mg Oral Daily   aspirin EC  81 mg Oral Daily   azithromycin  250 mg Oral Daily   enoxaparin (LOVENOX) injection  40 mg Subcutaneous QHS   ipratropium-albuterol  3 mL Nebulization Q6H   midodrine  2.5 mg Oral TID WC   multivitamin with minerals  1 tablet Oral Daily   predniSONE  50 mg Oral Q breakfast   Continuous Infusions:  sodium chloride 10 mL/hr (07/20/21 1350)   cefTRIAXone (ROCEPHIN)  IV 1 g (07/20/21 1351)     LOS: 5 days     Dessa Phi, DO [facility] 07/21/2021, 10:16 AM   Available via Epic secure chat 7am-7pm After these hours, please refer to coverage provider listed on [email or web address]

## 2021-07-21 NOTE — Progress Notes (Signed)
Patient is alert and oriented. Incentive spirometer with teach back placed in room. Patient is gradually being weaned off O2. Currently on 4L [place]

## 2021-07-21 NOTE — Progress Notes (Addendum)
SATURATION QUALIFICATIONS: (This note is used to comply with regulatory documentation for home oxygen)   Patient Saturations on Room Air at Rest = 85%   Patient Saturations on Room Air while Ambulating = n/a   Patient Saturations on n/a Liters of oxygen while Ambulating = n/a%   Please briefly explain why patient needs home oxygen: RA Sat at rest 80%

## 2021-07-22 MED ORDER — PREDNISONE 10 MG PO TABS: ORAL_TABLET | ORAL | 0 refills | Status: DC

## 2021-07-22 MED ORDER — MOMETASONE FURO-FORMOTEROL FUM 100-5 MCG/ACT IN AERO: 2.0000 | INHALATION_SPRAY | Freq: Two times a day (BID) | RESPIRATORY_TRACT | 0 refills | Status: AC

## 2021-07-22 MED ORDER — HYDROCHLOROTHIAZIDE 12.5 MG PO TABS: 12.5000 mg | ORAL_TABLET | Freq: Every day | ORAL | 0 refills | Status: AC

## 2021-07-22 NOTE — Progress Notes (Signed)
SATURATION QUALIFICATIONS: (This note is used to comply with regulatory documentation for home oxygen)  Patient Saturations on Room Air at Rest = 81%   Patient Saturations on 3 Liters of oxygen while Ambulating = 94%

## 2021-07-22 NOTE — TOC Transition Note (Signed)
Transition of Care [facility]) - CM/SW Discharge Note   Patient Details  Name: Anjolaoluwa Stjulian MRN: [id number] Date of Birth: 03/31/1961  [facility] CM/SW Contact:  Alberteen Sam, LCSW Phone Number: 07/22/2021, 2:11 PM   Clinical Narrative:     Patient to discharge home today with self care with 3L of oxygen. Rhonda with [employer] has delivered oxygen to hospital room and will set up home oxygen, no other discharge needs identified at this time.   Final next level of care: Home/Self Care Barriers to Discharge: No Barriers Identified   Patient Goals and CMS Choice Patient states their goals for this hospitalization and ongoing recovery are:: to go home CMS Medicare.gov Compare Post Acute Care list provided to:: Patient Choice offered to / list presented to : Patient  Discharge Placement                    Patient and family notified of of transfer: 07/22/21  Discharge Plan and Services                DME Arranged: Oxygen DME Agency: [employer] Date DME Agency Contacted: 07/22/21 Time DME Agency Contacted: [phone] Representative spoke with at DME Agency: [place] ([place]) Interventions     Readmission Risk Interventions No flowsheet data found.

## 2021-07-22 NOTE — Progress Notes (Signed)
Discharge instructions explained to pt / verbalized an understanding/ iv and tele removed/ will transport off unit via wheelchair with home 02  °

## 2021-07-22 NOTE — Discharge Summary (Signed)
Physician Discharge Summary  Virginia Norman [id number] DOB: 12-02-1960 DOA: 07/16/2021  PCP: [facility] date: 07/16/2021 Discharge date: 07/22/2021  Admitted From: Home Disposition:  Home  Recommendations for Outpatient Follow-up:  Follow up with PCP in 1 week  Discharge Condition: Stable CODE STATUS: Full  Diet recommendation: Heart healthy   Brief/Interim Summary: Virginia Norman is a  61 y.o. female with medical history significant of COPD not on oxygen normally, hypertension, CAD, dCHF, former smoker, who presents with shortness breath.   Patient states that she has SOB for more than 3 days, which has been progressively worsening.  Patient has productive cough with dark-colored sputum production.  She also has generalized weakness, poor appetite, decreased oral intake.  Patient states she had some mild left-sided sharp chest pain earlier, which has resolved. Per report, patient had oxygen desaturating to 80s in the morning. Patient was found to have severe respiratory distress and could not speak in full sentence.  BiPAP was started. Patient was found to have WBC 17.2, troponin level 89 --> 93, BNP 517, negative COVID PCR, procalcitonin 0.14, lactic acid 1.0, sodium 129, renal function okay, temperature 98.1, heart rate 105, RR 44.  Chest x-ray showed mild pulmonary edema and cardiomegaly.  VBG with pH 7.36, CO2 75, O2 76.  Patient was admitted and started on BiPAP.    Overnight, patient had episodes of hypotension.  She was taken off BiPAP and placed on high flow nasal cannula O2.  She was given IV fluid bolus with improvement in her blood pressure.   Patient was treated for COPD exacerbation with steroids, antibiotics and breathing treatments.  Patient continued to improve and oxygen was weaned to 3 L prior to discharge.  DME oxygen was ordered for discharge home.  Discharge Diagnoses:  Principal Problem:   Acute respiratory failure with hypoxia and  hypercapnia (HCC) Active Problems:   Benign essential HTN   CAD (coronary artery disease)   COPD exacerbation (HCC)   Acute on chronic diastolic CHF (congestive heart failure) (HCC)   Elevated troponin   Hyponatremia   Sepsis (HCC)   Acute on chronic diastolic (congestive) heart failure (HCC)   Acute hypoxemic and hypercapnic respiratory failure -Secondary to COPD exacerbation, CAP  -Found to have O2 sat 58% on room air in the ED  -Off BiPAP, currently on 3L O2  -Goal SPO2 88 to 92% wean as able -DME O2 ordered for discharge   COPD exacerbation, CAP -Continue prednisone, breathing treatments.  Prednisone taper on discharge and inhalers including albuterol, dulera, spiriva  -Completed 5 days antibiotics -CTA chest negative for PE, did show airspace opacities bilateral upper lobes  Chronic diastolic heart failure -Without evidence of acute exacerbation at this time.  Hold off on further diuresis given no peripheral edema as well as hypotension -Echocardiogram showed EF 65 to 70%, no regional wall motion abnormalities, grade 2 diastolic dysfunction   Hypotension -Patient was given midodrine during hospitalization.  Hypotension is now resolved  Essential hypertension -Resume HCTZ at a lower dose.  Hold lisinopril   Demand ischemia -Troponin 89 >> 93 -Without concerning changes in EKG, currently chest pain-free  -Echocardiogram showed EF 65 to 70%, no regional wall motion abnormalities, grade 2 diastolic dysfunction -Stop IV heparin -Continue aspirin    Hyponatremia -Improved  Discharge Instructions  Discharge Instructions     (HEART FAILURE PATIENTS) Call MD:  Anytime you have any of the following symptoms: 1) 3 pound weight gain in 24 hours or 5 pounds  in 1 week 2) shortness of breath, with or without a dry hacking cough 3) swelling in the hands, feet or stomach 4) if you have to sleep on extra pillows at night in order to breathe.   Complete by: As directed    Call MD  for:  difficulty breathing, headache or visual disturbances   Complete by: As directed    Call MD for:  extreme fatigue   Complete by: As directed    Call MD for:  persistant dizziness or light-headedness   Complete by: As directed    Call MD for:  persistant nausea and vomiting   Complete by: As directed    Call MD for:  severe uncontrolled pain   Complete by: As directed    Call MD for:  temperature >100.4   Complete by: As directed    Discharge instructions   Complete by: As directed    You were cared for by a hospitalist during your hospital stay. If you have any questions about your discharge medications or the care you received while you were in the hospital after you are discharged, you can call the unit and ask to speak with the hospitalist on call if the hospitalist that took care of you is not available. Once you are discharged, your primary care physician will handle any further medical issues. Please note that NO REFILLS for any discharge medications will be authorized once you are discharged, as it is imperative that you return to your primary care physician (or establish a relationship with a primary care physician if you do not have one) for your aftercare needs so that they can reassess your need for medications and monitor your lab values.   Increase activity slowly   Complete by: As directed       Allergies as of 07/22/2021   No Known Allergies      Medication List     STOP taking these medications    lisinopril 5 MG tablet Commonly known as: ZESTRIL       TAKE these medications    albuterol 108 (90 Base) MCG/ACT inhaler Commonly known as: VENTOLIN HFA Inhale 1-2 puffs into the lungs every 6 (six) hours as needed for wheezing or shortness of breath.   aspirin EC 81 MG tablet Take 81 mg by mouth daily.   dextromethorphan-guaiFENesin 30-600 MG 12hr tablet Commonly known as: MUCINEX DM Take 1 tablet by mouth 2 (two) times daily as needed for cough.    hydrochlorothiazide 12.5 MG tablet Commonly known as: HYDRODIURIL Take 1 tablet (12.5 mg total) by mouth daily. What changed:  medication strength how much to take   mometasone-formoterol 100-5 MCG/ACT Aero Commonly known as: DULERA Inhale 2 puffs into the lungs in the morning and at bedtime.   multivitamin with minerals tablet Take 1 tablet by mouth daily.   predniSONE 10 MG tablet Commonly known as: DELTASONE Take 4 tabs for 3 days, then 3 tabs for 3 days, then 2 tabs for 3 days, then 1 tab for 3 days, then 1/2 tab for 4 days.   tiotropium 18 MCG inhalation capsule Commonly known as: SPIRIVA Place 18 mcg into inhaler and inhale daily.   vitamin C 500 MG tablet Commonly known as: ASCORBIC ACID Take 500 mg by mouth daily.               Durable Medical Equipment  (From admission, onward)           Start     Ordered  07/22/21 1036  For home use only DME oxygen  Once       Question Answer Comment  Length of Need 6 Months   Mode or (Route) Nasal cannula   Liters per Minute 3   Frequency Continuous (stationary and portable oxygen unit needed)   Oxygen delivery system Gas      07/22/21 [place], [facility] Follow up.   Specialty: General Practice Contact information: [place] [address] [phone]                No Known Allergies  Consultations: None    Procedures/Studies: DG Chest 2 View  Result Date: 07/16/2021 CLINICAL DATA:  61 year old female with shortness of breath. EXAM: CHEST - 2 VIEW COMPARISON:  10/02/2019 FINDINGS: The mediastinal contours are within normal limits. Similar appearing moderate cardiomegaly. Atherosclerotic calcifications of the aortic arch. Hazy, peripheral and lower lobe predominant pulmonary opacities. Trace bilateral pleural effusions. No pneumothorax. No acute osseous abnormality. Diffuse osteopenia. IMPRESSION: 1. Mild pulmonary  edema and trace bilateral pleural effusions. 2. Similar appearing moderate cardiomegaly. 3.  Aortic Atherosclerosis (ICD10-I70.0). Electronically Signed   By: Ruthann Cancer M.D.   On: 07/16/2021 13:47   CT Angio Chest Pulmonary Embolism (PE) W or WO Contrast  Result Date: 07/17/2021 CLINICAL DATA:  Pulmonary embolism (PE) suspected, high prob. Shortness of breath, cough EXAM: CT ANGIOGRAPHY CHEST WITH CONTRAST TECHNIQUE: Multidetector CT imaging of the chest was performed using the standard protocol during bolus administration of intravenous contrast. Multiplanar CT image reconstructions and MIPs were obtained to evaluate the vascular anatomy. RADIATION DOSE REDUCTION: This exam was performed according to the departmental dose-optimization program which includes automated exposure control, adjustment of the mA and/or kV according to patient size and/or use of iterative reconstruction technique. CONTRAST:  80mL OMNIPAQUE IOHEXOL 350 MG/ML SOLN COMPARISON:  06/13/2020 FINDINGS: Cardiovascular: Heart is normal size. Aorta normal caliber. Scattered coronary artery and aortic calcifications. No filling defects in the pulmonary arteries to suggest pulmonary emboli. Mediastinum/Nodes: No mediastinal, hilar, or axillary adenopathy. Trachea and esophagus are unremarkable. Thyroid unremarkable. Lungs/Pleura: Areas of consolidation in the lower lobes bilaterally concerning for pneumonia. Small right pleural effusion and trace left pleural effusion. Clustered nodular densities in both upper lobes, also likely infectious. Upper Abdomen: Imaging into the upper abdomen demonstrates no acute findings. Musculoskeletal: Chest wall soft tissues are unremarkable. No acute bony abnormality. Review of the MIP images confirms the above findings. IMPRESSION: No evidence of pulmonary embolus. Airspace opacities in both lower lobes with scattered clustered nodular densities in both upper lobes concerning for pneumonia. Small right  pleural effusion and trace left pleural effusion. Coronary artery disease. Aortic Atherosclerosis (ICD10-I70.0). Electronically Signed   By: Rolm Baptise M.D.   On: 07/17/2021 02:34   ECHOCARDIOGRAM COMPLETE  Result Date: 07/17/2021    ECHOCARDIOGRAM REPORT   Patient Name:   Yer Sedita Date of Exam: 07/17/2021 Medical Rec #:  [id number]      Height:       67.0 in Accession #:    [id number]     Weight:       175.0 lb Date of Birth:  May 27, 1961     BSA:          1.911 m Patient Age:    75 years       BP:           107/58  mmHg Patient Gender: F              HR:           75 bpm. Exam Location:  [facility] Procedure: 2D Echo, Color Doppler, Cardiac Doppler and Intracardiac            Opacification Agent Indications:     I50.31 congestive heart failure-Acute Diastolic  History:         Patient has prior history of Echocardiogram examinations. CAD,                  COPD; Risk Factors:Hypertension.  Sonographer:     Charmayne Sheer Referring Phys:  [id number] Soledad Gerlach NIU Diagnosing Phys: Donnelly Angelica  Sonographer Comments: Technically difficult study due to poor echo windows. Image acquisition challenging due to COPD and Image acquisition challenging due to respiratory motion. IMPRESSIONS  1. Left ventricular ejection fraction, by estimation, is 65 to 70%. The left ventricle has normal function. The left ventricle has no regional wall motion abnormalities. There is mild left ventricular hypertrophy. Left ventricular diastolic parameters are consistent with Grade II diastolic dysfunction (pseudonormalization).  2. Right ventricular systolic function is normal. The right ventricular size is normal.  3. Left atrial size was severely dilated.  4. The mitral valve is grossly normal. Mild mitral valve regurgitation. Mild mitral stenosis.  5. The aortic valve is calcified. Aortic valve regurgitation is not visualized. Aortic valve sclerosis/calcification is present, without any evidence of aortic stenosis.  6. The inferior vena cava is normal  in size with <50% respiratory variability, suggesting right atrial pressure of 8 mmHg. FINDINGS  Left Ventricle: Left ventricular ejection fraction, by estimation, is 65 to 70%. The left ventricle has normal function. The left ventricle has no regional wall motion abnormalities. Definity contrast agent was given IV to delineate the left ventricular  endocardial borders. The left ventricular internal cavity size was normal in size. There is mild left ventricular hypertrophy. Left ventricular diastolic parameters are consistent with Grade II diastolic dysfunction (pseudonormalization). Right Ventricle: The right ventricular size is normal. No increase in right ventricular wall thickness. Right ventricular systolic function is normal. Left Atrium: Left atrial size was severely dilated. Right Atrium: Right atrial size was normal in size. Pericardium: There is no evidence of pericardial effusion. Mitral Valve: The mitral valve is grossly normal. Mild mitral valve regurgitation. Mild mitral valve stenosis. MV peak gradient, 9.6 mmHg. The mean mitral valve gradient is 5.0 mmHg. Tricuspid Valve: The tricuspid valve is not well visualized. Tricuspid valve regurgitation is not demonstrated. Aortic Valve: The aortic valve is calcified. Aortic valve regurgitation is not visualized. Aortic valve sclerosis/calcification is present, without any evidence of aortic stenosis. Aortic valve mean gradient measures 14.0 mmHg. Aortic valve peak gradient  measures 22.1 mmHg. Aortic valve area, by VTI measures 3.48 cm. Pulmonic Valve: The pulmonic valve was not well visualized. Aorta: The aortic root is normal in size and structure. Venous: The inferior vena cava is normal in size with less than 50% respiratory variability, suggesting right atrial pressure of 8 mmHg. IAS/Shunts: The interatrial septum was not well visualized.  LEFT VENTRICLE PLAX 2D LVIDd:         3.80 cm      Diastology LV PW:         1.31 cm      LV e' medial:    8.59  cm/s LV IVS:        1.27 cm      LV E/e' medial:  15.0 LVOT diam:     2.30 cm      LV e' lateral:   9.14 cm/s LV SV:         173          LV E/e' lateral: 14.1 LV SV Index:   91 LVOT Area:     4.15 cm  LV Volumes (MOD) LV vol d, MOD A2C: 123.0 ml LV vol d, MOD A4C: 169.0 ml LV vol s, MOD A2C: 34.7 ml LV vol s, MOD A4C: 37.1 ml LV SV MOD A2C:     88.3 ml LV SV MOD A4C:     169.0 ml LV SV MOD BP:      109.6 ml RIGHT VENTRICLE RV Basal diam:  4.14 cm LEFT ATRIUM              Index        RIGHT ATRIUM           Index LA Vol (A2C):   101.0 ml 52.86 ml/m  RA Area:     15.30 cm LA Vol (A4C):   114.0 ml 59.66 ml/m  RA Volume:   43.50 ml  22.77 ml/m LA Biplane Vol: 112.0 ml 58.62 ml/m  AORTIC VALVE                     PULMONIC VALVE AV Area (Vmax):    3.75 cm      PV Vmax:       1.14 m/s AV Area (Vmean):   3.64 cm      PV Vmean:      75.800 cm/s AV Area (VTI):     3.48 cm      PV VTI:        0.234 m AV Vmax:           235.00 cm/s   PV Peak grad:  5.2 mmHg AV Vmean:          178.000 cm/s  PV Mean grad:  3.0 mmHg AV VTI:            0.498 m AV Peak Grad:      22.1 mmHg AV Mean Grad:      14.0 mmHg LVOT Vmax:         212.00 cm/s LVOT Vmean:        156.000 cm/s LVOT VTI:          0.417 m LVOT/AV VTI ratio: 0.84  AORTA Ao Root diam: 3.00 cm MITRAL VALVE MV Area (PHT): 2.68 cm     SHUNTS MV Area VTI:   4.59 cm     Systemic VTI:  0.42 m MV Peak grad:  9.6 mmHg     Systemic Diam: 2.30 cm MV Mean grad:  5.0 mmHg MV Vmax:       1.55 m/s MV Vmean:      101.0 cm/s MV Decel Time: 283 msec MV E velocity: 129.00 cm/s MV A velocity: 105.00 cm/s MV E/A ratio:  1.23 Donnelly Angelica Electronically signed by Donnelly Angelica Signature Date/Time: 07/17/2021/1:10:19 PM    Final        Discharge Exam: Vitals:   07/22/21 0734 07/22/21 1004  BP:    Pulse:  74  Resp:  18  Temp:  98.3 F (36.8 C)  SpO2: 93% 92%    General: Pt is alert, awake, not in acute distress Cardiovascular: RRR, S1/S2 +, no edema Respiratory: CTA bilaterally, no  wheezing, no rhonchi, no respiratory distress, no conversational  dyspnea, on 3 L O2  Abdominal: Soft, NT, ND, bowel sounds + Extremities: no edema, no cyanosis Psych: Normal mood and affect, stable judgement and insight     The results of significant diagnostics from this hospitalization (including imaging, microbiology, ancillary and laboratory) are listed below for reference.     Microbiology: Recent Results (from the past 240 hour(s))  Expectorated Sputum Assessment w Gram Stain, Rflx to Resp Cult     Status: None   Collection Time: 07/16/21  5:19 AM   Specimen: Sputum  Result Value Ref Range Status   Specimen Description SPUTUM  Final   Special Requests NONE  Final   Sputum evaluation   Final    THIS SPECIMEN IS ACCEPTABLE FOR SPUTUM CULTURE Performed at [facility], [address], [place], [place] [address]    Report Status 07/18/2021 FINAL  Final  Culture, Respiratory w Gram Stain     Status: None   Collection Time: 07/16/21  5:19 AM   Specimen: SPU  Result Value Ref Range Status   Specimen Description   Final    SPUTUM Performed at [facility], [address] [place], [place] [address]    Special Requests   Final    NONE Reflexed from [id number] Performed at [facility], [address] [place], [place] [address]    Gram Stain   Final    RARE WBC PRESENT, PREDOMINANTLY PMN FEW GRAM POSITIVE COCCI IN PAIRS    Culture   Final    FEW Normal respiratory flora-no Staph aureus or Pseudomonas seen Performed at [facility], [place] [address] [place], [place] [address]    Report Status 07/20/2021 FINAL  Final  Blood culture (routine x 2)     Status: None   Collection Time: 07/16/21  1:33 PM   Specimen: BLOOD  Result Value Ref Range Status   Specimen Description BLOOD BLOOD RIGHT FOREARM  Final   Special Requests   Final    BOTTLES DRAWN AEROBIC AND ANAEROBIC Blood Culture adequate volume   Culture   Final    NO GROWTH 5  DAYS Performed at [facility], [address], [place], [place] [address]    Report Status 07/21/2021 FINAL  Final  Blood culture (routine x 2)     Status: None   Collection Time: 07/16/21  1:38 PM   Specimen: BLOOD  Result Value Ref Range Status   Specimen Description BLOOD BLOOD LEFT FOREARM  Final   Special Requests   Final    BOTTLES DRAWN AEROBIC AND ANAEROBIC Blood Culture adequate volume   Culture   Final    NO GROWTH 5 DAYS Performed at [facility], [address] [place], [place] [address]    Report Status 07/21/2021 FINAL  Final  Resp Panel by RT-PCR (Flu A&B, Covid) Nasopharyngeal Swab     Status: None   Collection Time: 07/16/21  2:05 PM   Specimen: Nasopharyngeal Swab; Nasopharyngeal(NP) swabs in vial transport medium  Result Value Ref Range Status   SARS Coronavirus 2 by RT PCR NEGATIVE NEGATIVE Final    Comment: (NOTE) SARS-CoV-2 target nucleic acids are NOT DETECTED.  The SARS-CoV-2 RNA is generally detectable in upper respiratory specimens during the acute phase of infection. The lowest concentration of SARS-CoV-2 viral copies this assay can detect is 138 copies/mL. A negative result does not preclude SARS-Cov-2 infection and should not be used as the sole basis for treatment or other patient management decisions. A negative  result may occur with  improper specimen collection/handling, submission of specimen other than nasopharyngeal swab, presence of viral mutation(s) within the areas targeted by this assay, and inadequate number of viral copies(<138 copies/mL). A negative result must be combined with clinical observations, patient history, and epidemiological information. The expected result is Negative.  Fact Sheet for Patients:  EntrepreneurPulse.com.au  Fact Sheet for Healthcare Providers:  IncredibleEmployment.be  This test is no t yet approved or cleared by the [place] FDA and  has been  authorized for detection and/or diagnosis of SARS-CoV-2 by FDA under an Emergency Use Authorization (EUA). This EUA will remain  in effect (meaning this test can be used) for the duration of the COVID-19 declaration under Section 564(b)(1) of the Act, 21 U.S.C.section 360bbb-3(b)(1), unless the authorization is terminated  or revoked sooner.       Influenza A by PCR NEGATIVE NEGATIVE Final   Influenza B by PCR NEGATIVE NEGATIVE Final    Comment: (NOTE) The Xpert Xpress SARS-CoV-2/FLU/RSV plus assay is intended as an aid in the diagnosis of influenza from Nasopharyngeal swab specimens and should not be used as a sole basis for treatment. Nasal washings and aspirates are unacceptable for Xpert Xpress SARS-CoV-2/FLU/RSV testing.  Fact Sheet for Patients: EntrepreneurPulse.com.au  Fact Sheet for Healthcare Providers: IncredibleEmployment.be  This test is not yet approved or cleared by the [place] FDA and has been authorized for detection and/or diagnosis of SARS-CoV-2 by FDA under an Emergency Use Authorization (EUA). This EUA will remain in effect (meaning this test can be used) for the duration of the COVID-19 declaration under Section 564(b)(1) of the Act, 21 U.S.C. section 360bbb-3(b)(1), unless the authorization is terminated or revoked.  Performed at [facility], [address] [place], [place] [address]      Labs: BNP (last 3 results) Recent Labs    07/16/21 1301  BNP 123456*   Basic Metabolic Panel: Recent Labs  Lab 07/16/21 1301 07/17/21 0103 07/17/21 0623 07/17/21 1132 07/18/21 0504 07/19/21 0353  NA 129* 127*  --  132* 133* 132*  K 4.1 4.1  --  4.0 4.1 4.7  CL 87* 86*  --  85* 88* 87*  CO2 33* 35*  --  38* 39* 42*  GLUCOSE 129* 131*  --  109* 109* 92  BUN 22 36*  --  42* 36* 19  CREATININE 0.65 0.98  --  0.80 0.62 0.60  CALCIUM 8.7* 8.2*  --  8.6* 8.4* 8.3*  MG  --   --  2.3  --   --   --     Liver Function Tests: Recent Labs  Lab 07/16/21 1301  AST 19  ALT 18  ALKPHOS 87  BILITOT 0.7  PROT 7.2  ALBUMIN 3.6   No results for input(s): LIPASE, AMYLASE in the last 168 hours. No results for input(s): AMMONIA in the last 168 hours. CBC: Recent Labs  Lab 07/16/21 1301 07/17/21 0623 07/18/21 0504 07/19/21 0353  WBC 17.2* 20.4* 17.8* 14.3*  NEUTROABS 14.3*  --   --   --   HGB 16.0* 14.8 13.9 13.7  HCT 48.5* 46.2* 43.0 43.0  MCV 90.7 91.3 93.7 93.1  PLT 263 292 283 298   Cardiac Enzymes: No results for input(s): CKTOTAL, CKMB, CKMBINDEX, TROPONINI in the last 168 hours. BNP: Invalid input(s): POCBNP CBG: No results for input(s): GLUCAP in the last 168 hours. D-Dimer No results for input(s): DDIMER in the last 72 hours. Hgb A1c No results for input(s): HGBA1C  in the last 72 hours. Lipid Profile No results for input(s): CHOL, HDL, LDLCALC, TRIG, CHOLHDL, LDLDIRECT in the last 72 hours. Thyroid function studies No results for input(s): TSH, T4TOTAL, T3FREE, THYROIDAB in the last 72 hours.  Invalid input(s): FREET3 Anemia work up No results for input(s): VITAMINB12, FOLATE, FERRITIN, TIBC, IRON, RETICCTPCT in the last 72 hours. Urinalysis No results found for: COLORURINE, APPEARANCEUR, LABSPEC, PHURINE, GLUCOSEU, HGBUR, BILIRUBINUR, KETONESUR, PROTEINUR, UROBILINOGEN, NITRITE, LEUKOCYTESUR Sepsis Labs Invalid input(s): PROCALCITONIN,  WBC,  LACTICIDVEN Microbiology Recent Results (from the past 240 hour(s))  Expectorated Sputum Assessment w Gram Stain, Rflx to Resp Cult     Status: None   Collection Time: 07/16/21  5:19 AM   Specimen: Sputum  Result Value Ref Range Status   Specimen Description SPUTUM  Final   Special Requests NONE  Final   Sputum evaluation   Final    THIS SPECIMEN IS ACCEPTABLE FOR SPUTUM CULTURE Performed at [facility], [address] [place], [place] [address]    Report Status 07/18/2021 FINAL  Final  Culture,  Respiratory w Gram Stain     Status: None   Collection Time: 07/16/21  5:19 AM   Specimen: SPU  Result Value Ref Range Status   Specimen Description   Final    SPUTUM Performed at [facility], [address], [place], [place] [address]    Special Requests   Final    NONE Reflexed from [id number] Performed at [facility], [address] [place], [place] [address]    Gram Stain   Final    RARE WBC PRESENT, PREDOMINANTLY PMN FEW GRAM POSITIVE COCCI IN PAIRS    Culture   Final    FEW Normal respiratory flora-no Staph aureus or Pseudomonas seen Performed at [facility], [address], [place], [place] [address]    Report Status 07/20/2021 FINAL  Final  Blood culture (routine x 2)     Status: None   Collection Time: 07/16/21  1:33 PM   Specimen: BLOOD  Result Value Ref Range Status   Specimen Description BLOOD BLOOD RIGHT FOREARM  Final   Special Requests   Final    BOTTLES DRAWN AEROBIC AND ANAEROBIC Blood Culture adequate volume   Culture   Final    NO GROWTH 5 DAYS Performed at [facility], [address] [place], [place] [address]    Report Status 07/21/2021 FINAL  Final  Blood culture (routine x 2)     Status: None   Collection Time: 07/16/21  1:38 PM   Specimen: BLOOD  Result Value Ref Range Status   Specimen Description BLOOD BLOOD LEFT FOREARM  Final   Special Requests   Final    BOTTLES DRAWN AEROBIC AND ANAEROBIC Blood Culture adequate volume   Culture   Final    NO GROWTH 5 DAYS Performed at [facility], [address] [place], [place] [address]    Report Status 07/21/2021 FINAL  Final  Resp Panel by RT-PCR (Flu A&B, Covid) Nasopharyngeal Swab     Status: None   Collection Time: 07/16/21  2:05 PM   Specimen: Nasopharyngeal Swab; Nasopharyngeal(NP) swabs in vial transport medium  Result Value Ref Range Status   SARS Coronavirus 2 by RT PCR NEGATIVE NEGATIVE Final    Comment: (NOTE) SARS-CoV-2 target nucleic  acids are NOT DETECTED.  The SARS-CoV-2 RNA is generally detectable in upper respiratory specimens during the acute phase of infection. The lowest concentration of SARS-CoV-2 viral copies  this assay can detect is 138 copies/mL. A negative result does not preclude SARS-Cov-2 infection and should not be used as the sole basis for treatment or other patient management decisions. A negative result may occur with  improper specimen collection/handling, submission of specimen other than nasopharyngeal swab, presence of viral mutation(s) within the areas targeted by this assay, and inadequate number of viral copies(<138 copies/mL). A negative result must be combined with clinical observations, patient history, and epidemiological information. The expected result is Negative.  Fact Sheet for Patients:  EntrepreneurPulse.com.au  Fact Sheet for Healthcare Providers:  IncredibleEmployment.be  This test is no t yet approved or cleared by the [place] FDA and  has been authorized for detection and/or diagnosis of SARS-CoV-2 by FDA under an Emergency Use Authorization (EUA). This EUA will remain  in effect (meaning this test can be used) for the duration of the COVID-19 declaration under Section 564(b)(1) of the Act, 21 U.S.C.section 360bbb-3(b)(1), unless the authorization is terminated  or revoked sooner.       Influenza A by PCR NEGATIVE NEGATIVE Final   Influenza B by PCR NEGATIVE NEGATIVE Final    Comment: (NOTE) The Xpert Xpress SARS-CoV-2/FLU/RSV plus assay is intended as an aid in the diagnosis of influenza from Nasopharyngeal swab specimens and should not be used as a sole basis for treatment. Nasal washings and aspirates are unacceptable for Xpert Xpress SARS-CoV-2/FLU/RSV testing.  Fact Sheet for Patients: EntrepreneurPulse.com.au  Fact Sheet for Healthcare Providers: IncredibleEmployment.be  This  test is not yet approved or cleared by the [place] FDA and has been authorized for detection and/or diagnosis of SARS-CoV-2 by FDA under an Emergency Use Authorization (EUA). This EUA will remain in effect (meaning this test can be used) for the duration of the COVID-19 declaration under Section 564(b)(1) of the Act, 21 U.S.C. section 360bbb-3(b)(1), unless the authorization is terminated or revoked.  Performed at [facility], [address] [place],  [address]      Patient was seen and examined on the day of discharge and was found to be in stable condition. Time coordinating discharge: 25 minutes including assessment and coordination of care, as well as examination of the patient.   SIGNED:  Dessa Phi, DO [facility] 07/22/2021, 10:40 AM

## 2021-07-28 ENCOUNTER — Ambulatory Visit: Payer: Self-pay | Admitting: Family

## 2021-07-28 ENCOUNTER — Telehealth: Payer: Self-pay | Admitting: Family

## 2021-07-28 NOTE — Telephone Encounter (Signed)
Called patient in attempt to reschedule her no show new patient appointment with [place] but was unable to reach.   Nyiesha Beever, NT

## 2021-07-29 NOTE — Progress Notes (Signed)
Patient ID: Virginia Norman, female    DOB: [birth year], 61 y.o.   MRN: [id number]  HPI  Virginia Norman is a 61 y/o female with a history of CAD, HTN, COPD, previous tobacco use and chronic heart failure.   Echo report from 07/17/21 reviewed and showed an EF of 65-70% along with mild LVH, severe LAE and mild MR.   Admitted 07/16/21 due to worsening SOB. Started on bipap. Given IV fluid bolus due to hypotension. Given steroids, antibiotics and nebulizer treatment due to COPD exacerbation. Completed 5 days of antibiotics. Midodrine given for hypotension which then resolved. Discharged after 6 days.   She presents today for her initial visit with a chief complaint of minimal fatigue upon moderate exertion. She describes this as chronic in nature having been present for several years. She has associated cough, shortness of breath, palpitations and light-headedness along with this. She denies any difficulty sleeping, abdominal distention, pedal edema, chest pain, wheezing or weight gain.   Has been wearing oxygen at 3L at bedtime since recent discharge.   Past Medical History:  Diagnosis Date   CHF (congestive heart failure) (HCC)    COPD (chronic obstructive pulmonary disease) (HCC)    Coronary artery disease    Hypertension    Past Surgical History:  Procedure Laterality Date   ABDOMINAL HYSTERECTOMY     Family History  Problem Relation Age of Onset   Hypertension Mother    Heart disease Mother    Stomach cancer Father    Lupus Sister    Social History   Tobacco Use   Smoking status: Former    Packs/day: 0.75    Years: 44.00    Pack years: 33.00    Types: Cigarettes   Smokeless tobacco: Never  Substance Use Topics   Alcohol use: Never   No Known Allergies  Prior to Admission medications   Medication Sig Start Date End Date Taking? Authorizing Provider  aspirin EC 81 MG tablet Take 81 mg by mouth daily.   Yes [provider]  dextromethorphan-guaiFENesin (MUCINEX DM)  30-600 MG 12hr tablet Take 1 tablet by mouth 2 (two) times daily as needed for cough.   Yes [provider]  hydrochlorothiazide (HYDRODIURIL) 12.5 MG tablet Take 1 tablet (12.5 mg total) by mouth daily. 07/22/21  Yes Virginia Stain, DO  mometasone-formoterol (DULERA) 100-5 MCG/ACT AERO Inhale 2 puffs into the lungs in the morning and at bedtime. 07/22/21  Yes Virginia Stain, DO  Multiple Vitamins-Minerals (MULTIVITAMIN WITH MINERALS) tablet Take 1 tablet by mouth daily.   Yes [provider]  predniSONE (DELTASONE) 10 MG tablet Take 4 tabs for 3 days, then 3 tabs for 3 days, then 2 tabs for 3 days, then 1 tab for 3 days, then 1/2 tab for 4 days. 07/22/21  Yes Virginia Stain, DO  tiotropium (SPIRIVA) 18 MCG inhalation capsule Place 18 mcg into inhaler and inhale daily.   Yes [provider]  albuterol (VENTOLIN HFA) 108 (90 Base) MCG/ACT inhaler Inhale 1-2 puffs into the lungs every 6 (six) hours as needed for wheezing or shortness of breath. Patient not taking: Reported on 07/30/2021    [provider]  vitamin C (ASCORBIC ACID) 500 MG tablet Take 500 mg by mouth daily. Patient not taking: Reported on 07/30/2021    [provider]    Review of Systems  Constitutional:  Positive for fatigue. Negative for appetite change.  HENT:  Negative for congestion, postnasal drip and sinus pressure.   Eyes: Negative.  Respiratory:  Positive for cough and shortness of breath. Negative for chest tightness and wheezing.   Cardiovascular:  Positive for palpitations (at times). Negative for chest pain and leg swelling.  Gastrointestinal:  Negative for abdominal distention and abdominal pain.  Endocrine: Negative.   Genitourinary: Negative.   Musculoskeletal:  Negative for back pain and neck pain.  Skin: Negative.   Allergic/Immunologic: Negative.   Neurological:  Positive for light-headedness (sometimes). Negative for dizziness.  Hematological:  Negative for  adenopathy. Does not bruise/bleed easily.  Psychiatric/Behavioral:  Negative for dysphoric mood and sleep disturbance (sleeping in recliner partially reclined). The patient is not nervous/anxious.    Vitals:   07/30/21 1352  BP: (!) 147/76  Pulse: 72  Resp: 20  SpO2: 93%  Weight: 177 lb 6 oz (80.5 kg)  Height: 5\' 7"  (1.702 m)   Wt Readings from Last 3 Encounters:  07/30/21 177 lb 6 oz (80.5 kg)  07/20/21 182 lb 12.8 oz (82.9 kg)  06/13/20 190 lb (86.2 kg)   Lab Results  Component Value Date   CREATININE 0.60 07/19/2021   CREATININE 0.62 07/18/2021   CREATININE 0.80 07/17/2021   Physical Exam Vitals and nursing note reviewed.  Constitutional:      Appearance: She is well-developed.  HENT:     Head: Normocephalic and atraumatic.  Neck:     Vascular: No JVD.  Cardiovascular:     Rate and Rhythm: Normal rate and regular rhythm.  Pulmonary:     Effort: Pulmonary effort is normal. No accessory muscle usage.     Breath sounds: No wheezing or rales.  Abdominal:     Palpations: Abdomen is soft.     Tenderness: There is no abdominal tenderness.  Musculoskeletal:     Cervical back: Neck supple.     Right lower leg: No tenderness. Edema (1+ pitting) present.     Left lower leg: No tenderness. Edema (1+ pitting) present.  Skin:    General: Skin is warm and dry.  Neurological:     General: No focal deficit present.     Mental Status: She is alert and oriented to person, place, and time.  Psychiatric:        Mood and Affect: Mood normal.        Behavior: Behavior normal.    Assessment & Plan:  1: Chronic heart failure with preserved ejection fraction with structural changes (LVH/LAE)- - NYHA class II - euvolemic today - weighing daily; instructed to call for an overnight weight gain of > 2 pounds or a weekly weight gain of > 5 pounds - not adding salt and has been looking at food labels for sodium content - will begin jardiance 10mg  daily; 30 day voucher provided - will  check BMP next visit - also discussed adding entresto but she's not excited about having to take something twice daily - elevate legs when sitting for long periods of time - BNP 07/16/21 was 517.7  2: HTN- - BP mildly elevated (147/76) - sees PCP at [facility] - BMP 07/19/21 reviewed and showed sodium 132, potassium 4.7, creatinine 0.6 & GFR >60  3: COPD- - previous tobacco use; stopped when recently admitted - wearing oxygen at 3L at bedtime   Medication bottles reviewed.   Return in 1 month, sooner if needed

## 2021-07-30 ENCOUNTER — Ambulatory Visit: Payer: Self-pay | Attending: Family | Admitting: Family

## 2021-07-30 ENCOUNTER — Other Ambulatory Visit: Payer: Self-pay

## 2021-07-30 ENCOUNTER — Encounter: Payer: Self-pay | Admitting: Family

## 2021-07-30 VITALS — BP 147/76 | HR 72 | Resp 20 | Ht 67.0 in | Wt 177.4 lb

## 2021-07-30 DIAGNOSIS — J449 Chronic obstructive pulmonary disease, unspecified: Secondary | ICD-10-CM

## 2021-07-30 DIAGNOSIS — I2510 Atherosclerotic heart disease of native coronary artery without angina pectoris: Secondary | ICD-10-CM | POA: Insufficient documentation

## 2021-07-30 DIAGNOSIS — I5032 Chronic diastolic (congestive) heart failure: Secondary | ICD-10-CM | POA: Insufficient documentation

## 2021-07-30 DIAGNOSIS — Z9981 Dependence on supplemental oxygen: Secondary | ICD-10-CM | POA: Insufficient documentation

## 2021-07-30 DIAGNOSIS — I110 Hypertensive heart disease with heart failure: Secondary | ICD-10-CM | POA: Insufficient documentation

## 2021-07-30 DIAGNOSIS — Z87891 Personal history of nicotine dependence: Secondary | ICD-10-CM | POA: Insufficient documentation

## 2021-07-30 DIAGNOSIS — I10 Essential (primary) hypertension: Secondary | ICD-10-CM

## 2021-07-30 DIAGNOSIS — J441 Chronic obstructive pulmonary disease with (acute) exacerbation: Secondary | ICD-10-CM | POA: Insufficient documentation

## 2021-07-30 MED ORDER — EMPAGLIFLOZIN 10 MG PO TABS: 10.0000 mg | ORAL_TABLET | Freq: Every day | ORAL | 5 refills | Status: AC

## 2021-07-30 NOTE — Patient Instructions (Signed)
Continue weighing daily and call for an overnight weight gain of 3 pounds or more or a weekly weight gain of more than 5 pounds.  ° °The [facility] will be moving around the corner to [id number] mid-February. Our phone number will remain the same ° °

## 2021-08-29 NOTE — Progress Notes (Signed)
Patient ID: Virginia Norman, female    DOB: 03-28-1961, 61 y.o.   MRN: [id number]  HPI  Virginia Norman is a 61 y/o female with a history of CAD, HTN, COPD, previous tobacco use and chronic heart failure.   Echo report from 07/17/21 reviewed and showed an EF of 65-70% along with mild LVH, severe LAE and mild MR.   Admitted 07/16/21 due to worsening SOB. Started on bipap. Given IV fluid bolus due to hypotension. Given steroids, antibiotics and nebulizer treatment due to COPD exacerbation. Completed 5 days of antibiotics. Midodrine given for hypotension which then resolved. Discharged after 6 days.   She presents today for a follow-up visit with a chief complaint of minimal fatigue upon moderate exertion. She describes this as chronic in nature having been present for several years. She has associated shortness of breath, pedal edema, palpitations & light-headedness along with this. She denies any difficulty sleeping, abdominal distention, chest pain, cough or weight gain.   Has tolerated jardiance without known side effects.   Past Medical History:  Diagnosis Date   CHF (congestive heart failure) (HCC)    COPD (chronic obstructive pulmonary disease) (HCC)    Coronary artery disease    Hypertension    Past Surgical History:  Procedure Laterality Date   ABDOMINAL HYSTERECTOMY     Family History  Problem Relation Age of Onset   Hypertension Mother    Heart disease Mother    Stomach cancer Father    Lupus Sister    Social History   Tobacco Use   Smoking status: Former    Packs/day: 0.75    Years: 44.00    Pack years: 33.00    Types: Cigarettes   Smokeless tobacco: Never  Substance Use Topics   Alcohol use: Never   No Known Allergies  Prior to Admission medications   Medication Sig Start Date End Date Taking? Authorizing Provider  aspirin EC 81 MG tablet Take 81 mg by mouth daily.   Yes [provider]  empagliflozin (JARDIANCE) 10 MG TABS tablet Take 1 tablet (10 mg  total) by mouth daily before breakfast. 07/30/21  Yes Darylene Price A, FNP  hydrochlorothiazide (HYDRODIURIL) 12.5 MG tablet Take 1 tablet (12.5 mg total) by mouth daily. 07/22/21  Yes Dessa Phi, DO  mometasone-formoterol (DULERA) 100-5 MCG/ACT AERO Inhale 2 puffs into the lungs in the morning and at bedtime. 07/22/21  Yes Dessa Phi, DO  Multiple Vitamins-Minerals (MULTIVITAMIN WITH MINERALS) tablet Take 1 tablet by mouth daily.   Yes [provider]  tiotropium (SPIRIVA) 18 MCG inhalation capsule Place 18 mcg into inhaler and inhale daily.   Yes [provider]  albuterol (VENTOLIN HFA) 108 (90 Base) MCG/ACT inhaler Inhale 1-2 puffs into the lungs every 6 (six) hours as needed for wheezing or shortness of breath. 09/01/21   Alisa Graff, FNP    Review of Systems  Constitutional:  Positive for fatigue. Negative for appetite change.  HENT:  Negative for congestion, postnasal drip and sinus pressure.   Eyes: Negative.   Respiratory:  Positive for shortness of breath. Negative for cough, chest tightness and wheezing.   Cardiovascular:  Positive for palpitations (at times) and leg swelling. Negative for chest pain.  Gastrointestinal:  Negative for abdominal distention and abdominal pain.  Endocrine: Negative.   Genitourinary: Negative.   Musculoskeletal:  Negative for back pain and neck pain.  Skin: Negative.   Allergic/Immunologic: Negative.   Neurological:  Positive for light-headedness (sometimes). Negative for dizziness.  Hematological:  Negative for adenopathy. Does not bruise/bleed easily.  Psychiatric/Behavioral:  Negative for dysphoric mood and sleep disturbance (sleeping in recliner partially reclined). The patient is not nervous/anxious.    Vitals:   09/01/21 1459  BP: 124/62  Pulse: 88  Resp: 20  SpO2: 91%  Weight: 182 lb 1 oz (82.6 kg)  Height: 5\' 7"  (1.702 m)   Wt Readings from Last 3 Encounters:  09/01/21 182 lb 1 oz (82.6 kg)  07/30/21 177 lb 6  oz (80.5 kg)  07/20/21 182 lb 12.8 oz (82.9 kg)   Lab Results  Component Value Date   CREATININE 0.63 09/01/2021   CREATININE 0.60 07/19/2021   CREATININE 0.62 07/18/2021    Physical Exam Vitals and nursing note reviewed.  Constitutional:      Appearance: She is well-developed.  HENT:     Head: Normocephalic and atraumatic.  Neck:     Vascular: No JVD.  Cardiovascular:     Rate and Rhythm: Normal rate and regular rhythm.  Pulmonary:     Effort: Pulmonary effort is normal. No accessory muscle usage.     Breath sounds: No wheezing or rales.  Abdominal:     Palpations: Abdomen is soft.     Tenderness: There is no abdominal tenderness.  Musculoskeletal:     Cervical back: Neck supple.     Right lower leg: No tenderness. Edema (1+ pitting) present.     Left lower leg: No tenderness. Edema (1+ pitting) present.  Skin:    General: Skin is warm and dry.  Neurological:     General: No focal deficit present.     Mental Status: She is alert and oriented to person, place, and time.  Psychiatric:        Mood and Affect: Mood normal.        Behavior: Behavior normal.    Assessment & Plan:  1: Chronic heart failure with preserved ejection fraction with structural changes (LVH/LAE)- - NYHA class II - euvolemic today - weighing daily & says her weight ranges from 172-176 pounds; reminded to call for an overnight weight gain of > 2 pounds or a weekly weight gain of > 5 pounds - weight up 5 pounds from last visit here 1 month ago - not adding salt and has been looking at food labels for sodium content - will check BMP today as jardiance was added at last visit - discussed adding entresto but she's not excited about having to take something twice daily - elevate legs when sitting for long periods of time - get compression socks and put them on every morning with removal at bedtime; reports edema resolves overnight - BNP 07/16/21 was 517.7  2: HTN- - BP looks good (124/62) - sees PCP  at [facility] - BMP 07/19/21 reviewed and showed sodium 132, potassium 4.7, creatinine 0.6 & GFR >60  3: COPD- - previous tobacco use; stopped when recently admitted - wearing oxygen at 3L at bedtime   Medication bottles reviewed.   Return in 2 months, sooner if needed

## 2021-09-01 ENCOUNTER — Other Ambulatory Visit: Payer: Self-pay

## 2021-09-01 ENCOUNTER — Ambulatory Visit: Payer: Self-pay | Attending: Family | Admitting: Family

## 2021-09-01 ENCOUNTER — Encounter: Payer: Self-pay | Admitting: Family

## 2021-09-01 ENCOUNTER — Other Ambulatory Visit
Admission: RE | Admit: 2021-09-01 | Discharge: 2021-09-01 | Disposition: A | Discharge status: Home / Self Care | Payer: Self-pay | Source: Ambulatory Visit | Attending: Family | Admitting: Family

## 2021-09-01 VITALS — BP 124/62 | HR 88 | Resp 20 | Ht 67.0 in | Wt 182.1 lb

## 2021-09-01 DIAGNOSIS — Z87891 Personal history of nicotine dependence: Secondary | ICD-10-CM | POA: Insufficient documentation

## 2021-09-01 DIAGNOSIS — I5032 Chronic diastolic (congestive) heart failure: Secondary | ICD-10-CM | POA: Insufficient documentation

## 2021-09-01 DIAGNOSIS — J449 Chronic obstructive pulmonary disease, unspecified: Secondary | ICD-10-CM

## 2021-09-01 DIAGNOSIS — I110 Hypertensive heart disease with heart failure: Secondary | ICD-10-CM | POA: Insufficient documentation

## 2021-09-01 DIAGNOSIS — J441 Chronic obstructive pulmonary disease with (acute) exacerbation: Secondary | ICD-10-CM | POA: Insufficient documentation

## 2021-09-01 DIAGNOSIS — Z9981 Dependence on supplemental oxygen: Secondary | ICD-10-CM | POA: Insufficient documentation

## 2021-09-01 DIAGNOSIS — Z7984 Long term (current) use of oral hypoglycemic drugs: Secondary | ICD-10-CM | POA: Insufficient documentation

## 2021-09-01 DIAGNOSIS — I10 Essential (primary) hypertension: Secondary | ICD-10-CM

## 2021-09-01 DIAGNOSIS — I2510 Atherosclerotic heart disease of native coronary artery without angina pectoris: Secondary | ICD-10-CM | POA: Insufficient documentation

## 2021-09-01 LAB — BASIC METABOLIC PANEL
Anion gap: 8 (ref 5–15)
BUN: 9 mg/dL (ref 8–23)
CO2: 34 mmol/L — ABNORMAL HIGH (ref 22–32)
Calcium: 9.1 mg/dL (ref 8.9–10.3)
Chloride: 98 mmol/L (ref 98–111)
Creatinine, Ser: 0.63 mg/dL (ref 0.44–1.00)
GFR, Estimated: >60 mL/min (ref >60)
Glucose, Bld: 132 mg/dL — ABNORMAL HIGH (ref 70–99)
Potassium: 3.2 mmol/L — ABNORMAL LOW (ref 3.5–5.1)
Sodium: 140 mmol/L (ref 135–145)

## 2021-09-01 MED ORDER — ALBUTEROL SULFATE HFA 108 (90 BASE) MCG/ACT IN AERS: 1.0000 | INHALATION_SPRAY | Freq: Four times a day (QID) | RESPIRATORY_TRACT | 5 refills | Status: AC | PRN

## 2021-09-01 NOTE — Patient Instructions (Signed)
Continue weighing daily and call for an overnight weight gain of 3 pounds or more or a weekly weight gain of more than 5 pounds.  °

## 2021-09-02 ENCOUNTER — Telehealth: Payer: Self-pay | Admitting: Family

## 2021-09-02 MED ORDER — POTASSIUM CHLORIDE CRYS ER 20 MEQ PO TBCR: 20.0000 meq | EXTENDED_RELEASE_TABLET | Freq: Every day | ORAL | 3 refills | Status: AC

## 2021-09-02 NOTE — Telephone Encounter (Signed)
Spoke with patient regarding BMP results obtained 09/01/21. Renal function looks great. Potassium is slightly low at 3.2.   Will send in potassium supplement daily and recheck labs at next visit. Patient verbalized understanding.

## 2021-10-28 ENCOUNTER — Inpatient Hospital Stay
Admission: EM | Admit: 2021-10-28 | Discharge: 2021-12-04 | DRG: 207 | Disposition: E | Payer: Self-pay | Attending: Pulmonary Disease | Admitting: Pulmonary Disease

## 2021-10-28 ENCOUNTER — Other Ambulatory Visit: Payer: Self-pay

## 2021-10-28 ENCOUNTER — Ambulatory Visit: Payer: Self-pay | Admitting: Family

## 2021-10-28 ENCOUNTER — Telehealth: Payer: Self-pay | Admitting: Family

## 2021-10-28 ENCOUNTER — Emergency Department: Payer: Self-pay

## 2021-10-28 DIAGNOSIS — N179 Acute kidney failure, unspecified: Secondary | ICD-10-CM | POA: Diagnosis not present

## 2021-10-28 DIAGNOSIS — Z7984 Long term (current) use of oral hypoglycemic drugs: Secondary | ICD-10-CM

## 2021-10-28 DIAGNOSIS — I5043 Acute on chronic combined systolic (congestive) and diastolic (congestive) heart failure: Secondary | ICD-10-CM | POA: Diagnosis not present

## 2021-10-28 DIAGNOSIS — Z800 Family history of malignant neoplasm of digestive organs: Secondary | ICD-10-CM

## 2021-10-28 DIAGNOSIS — Z20822 Contact with and (suspected) exposure to covid-19: Secondary | ICD-10-CM | POA: Diagnosis present

## 2021-10-28 DIAGNOSIS — F419 Anxiety disorder, unspecified: Secondary | ICD-10-CM | POA: Diagnosis not present

## 2021-10-28 DIAGNOSIS — Z90710 Acquired absence of both cervix and uterus: Secondary | ICD-10-CM

## 2021-10-28 DIAGNOSIS — I2510 Atherosclerotic heart disease of native coronary artery without angina pectoris: Secondary | ICD-10-CM | POA: Diagnosis present

## 2021-10-28 DIAGNOSIS — I5032 Chronic diastolic (congestive) heart failure: Secondary | ICD-10-CM

## 2021-10-28 DIAGNOSIS — J122 Parainfluenza virus pneumonia: Secondary | ICD-10-CM | POA: Diagnosis present

## 2021-10-28 DIAGNOSIS — Z66 Do not resuscitate: Secondary | ICD-10-CM | POA: Diagnosis not present

## 2021-10-28 DIAGNOSIS — I4892 Unspecified atrial flutter: Secondary | ICD-10-CM | POA: Diagnosis present

## 2021-10-28 DIAGNOSIS — X58XXXA Exposure to other specified factors, initial encounter: Secondary | ICD-10-CM | POA: Diagnosis not present

## 2021-10-28 DIAGNOSIS — E874 Mixed disorder of acid-base balance: Secondary | ICD-10-CM | POA: Diagnosis present

## 2021-10-28 DIAGNOSIS — E875 Hyperkalemia: Secondary | ICD-10-CM | POA: Diagnosis not present

## 2021-10-28 DIAGNOSIS — Z7982 Long term (current) use of aspirin: Secondary | ICD-10-CM

## 2021-10-28 DIAGNOSIS — I471 Supraventricular tachycardia: Secondary | ICD-10-CM | POA: Diagnosis not present

## 2021-10-28 DIAGNOSIS — Z832 Family history of diseases of the blood and blood-forming organs and certain disorders involving the immune mechanism: Secondary | ICD-10-CM

## 2021-10-28 DIAGNOSIS — I10 Essential (primary) hypertension: Secondary | ICD-10-CM | POA: Diagnosis present

## 2021-10-28 DIAGNOSIS — G928 Other toxic encephalopathy: Secondary | ICD-10-CM | POA: Diagnosis not present

## 2021-10-28 DIAGNOSIS — K7200 Acute and subacute hepatic failure without coma: Secondary | ICD-10-CM | POA: Diagnosis not present

## 2021-10-28 DIAGNOSIS — J441 Chronic obstructive pulmonary disease with (acute) exacerbation: Principal | ICD-10-CM | POA: Diagnosis present

## 2021-10-28 DIAGNOSIS — F22 Delusional disorders: Secondary | ICD-10-CM | POA: Diagnosis present

## 2021-10-28 DIAGNOSIS — Z79899 Other long term (current) drug therapy: Secondary | ICD-10-CM

## 2021-10-28 DIAGNOSIS — Z515 Encounter for palliative care: Secondary | ICD-10-CM

## 2021-10-28 DIAGNOSIS — A419 Sepsis, unspecified organism: Secondary | ICD-10-CM | POA: Diagnosis not present

## 2021-10-28 DIAGNOSIS — R6521 Severe sepsis with septic shock: Secondary | ICD-10-CM | POA: Diagnosis not present

## 2021-10-28 DIAGNOSIS — I272 Pulmonary hypertension, unspecified: Secondary | ICD-10-CM | POA: Diagnosis present

## 2021-10-28 DIAGNOSIS — I110 Hypertensive heart disease with heart failure: Secondary | ICD-10-CM | POA: Diagnosis present

## 2021-10-28 DIAGNOSIS — J9601 Acute respiratory failure with hypoxia: Secondary | ICD-10-CM | POA: Diagnosis present

## 2021-10-28 DIAGNOSIS — I4819 Other persistent atrial fibrillation: Secondary | ICD-10-CM | POA: Diagnosis present

## 2021-10-28 DIAGNOSIS — J810 Acute pulmonary edema: Secondary | ICD-10-CM | POA: Diagnosis not present

## 2021-10-28 DIAGNOSIS — Z8249 Family history of ischemic heart disease and other diseases of the circulatory system: Secondary | ICD-10-CM

## 2021-10-28 DIAGNOSIS — J80 Acute respiratory distress syndrome: Secondary | ICD-10-CM | POA: Diagnosis present

## 2021-10-28 DIAGNOSIS — J849 Interstitial pulmonary disease, unspecified: Secondary | ICD-10-CM | POA: Diagnosis present

## 2021-10-28 DIAGNOSIS — D649 Anemia, unspecified: Secondary | ICD-10-CM | POA: Diagnosis not present

## 2021-10-28 DIAGNOSIS — R571 Hypovolemic shock: Secondary | ICD-10-CM | POA: Diagnosis not present

## 2021-10-28 DIAGNOSIS — T17590A Other foreign object in bronchus causing asphyxiation, initial encounter: Secondary | ICD-10-CM | POA: Diagnosis not present

## 2021-10-28 DIAGNOSIS — I480 Paroxysmal atrial fibrillation: Principal | ICD-10-CM

## 2021-10-28 DIAGNOSIS — Z87891 Personal history of nicotine dependence: Secondary | ICD-10-CM

## 2021-10-28 DIAGNOSIS — J9602 Acute respiratory failure with hypercapnia: Secondary | ICD-10-CM

## 2021-10-28 DIAGNOSIS — T502X5A Adverse effect of carbonic-anhydrase inhibitors, benzothiadiazides and other diuretics, initial encounter: Secondary | ICD-10-CM | POA: Diagnosis present

## 2021-10-28 DIAGNOSIS — E869 Volume depletion, unspecified: Secondary | ICD-10-CM | POA: Diagnosis present

## 2021-10-28 DIAGNOSIS — Z9981 Dependence on supplemental oxygen: Secondary | ICD-10-CM

## 2021-10-28 DIAGNOSIS — Z7951 Long term (current) use of inhaled steroids: Secondary | ICD-10-CM

## 2021-10-28 DIAGNOSIS — D72823 Leukemoid reaction: Secondary | ICD-10-CM | POA: Diagnosis present

## 2021-10-28 DIAGNOSIS — I248 Other forms of acute ischemic heart disease: Secondary | ICD-10-CM | POA: Diagnosis not present

## 2021-10-28 LAB — CBC WITH DIFFERENTIAL/PLATELET
Abs Immature Granulocytes: 0.06 10*3/uL (ref 0.00–0.07)
Basophils Absolute: 0 10*3/uL (ref 0.0–0.1)
Basophils Relative: 0 %
Eosinophils Absolute: 0 10*3/uL (ref 0.0–0.5)
Eosinophils Relative: 0 %
HCT: 53.5 % — ABNORMAL HIGH (ref 36.0–46.0)
Hemoglobin: 16.4 g/dL — ABNORMAL HIGH (ref 12.0–15.0)
Immature Granulocytes: 1 %
Lymphocytes Relative: 7 %
Lymphs Abs: 0.7 10*3/uL (ref 0.7–4.0)
MCH: 29 pg (ref 26.0–34.0)
MCHC: 30.7 g/dL (ref 30.0–36.0)
MCV: 94.5 fL (ref 80.0–100.0)
Monocytes Absolute: 0.7 10*3/uL (ref 0.1–1.0)
Monocytes Relative: 7 %
Neutro Abs: 9.4 10*3/uL — ABNORMAL HIGH (ref 1.7–7.7)
Neutrophils Relative %: 85 %
Platelets: 233 10*3/uL (ref 150–400)
RBC: 5.66 MIL/uL — ABNORMAL HIGH (ref 3.87–5.11)
RDW: 13.2 % (ref 11.5–15.5)
WBC: 10.9 10*3/uL — ABNORMAL HIGH (ref 4.0–10.5)
nRBC: 0 % (ref 0.0–0.2)

## 2021-10-28 LAB — BASIC METABOLIC PANEL
Anion gap: 10 (ref 5–15)
BUN: 25 mg/dL — ABNORMAL HIGH (ref 8–23)
CO2: 36 mmol/L — ABNORMAL HIGH (ref 22–32)
Calcium: 9 mg/dL (ref 8.9–10.3)
Chloride: 93 mmol/L — ABNORMAL LOW (ref 98–111)
Creatinine, Ser: 0.65 mg/dL (ref 0.44–1.00)
GFR, Estimated: >60 mL/min (ref >60)
Glucose, Bld: 121 mg/dL — ABNORMAL HIGH (ref 70–99)
Potassium: 5 mmol/L (ref 3.5–5.1)
Sodium: 139 mmol/L (ref 135–145)

## 2021-10-28 LAB — BLOOD GAS, VENOUS
Acid-Base Excess: 10.4 mmol/L — ABNORMAL HIGH (ref 0.0–2.0)
Bicarbonate: 42 mmol/L — ABNORMAL HIGH (ref 20.0–28.0)
O2 Saturation: 94 %
Patient temperature: 37
pCO2, Ven: 98 mmHg (ref 44–60)
pH, Ven: 7.24 — ABNORMAL LOW (ref 7.25–7.43)
pO2, Ven: 68 mmHg — ABNORMAL HIGH (ref 32–45)

## 2021-10-28 LAB — TROPONIN I (HIGH SENSITIVITY)
Troponin I (High Sensitivity): 18 ng/L — ABNORMAL HIGH (ref <18)
Troponin I (High Sensitivity): 19 ng/L — ABNORMAL HIGH (ref <18)

## 2021-10-28 LAB — BRAIN NATRIURETIC PEPTIDE: B Natriuretic Peptide: 129 pg/mL — ABNORMAL HIGH (ref 0.0–100.0)

## 2021-10-28 MED ORDER — ENOXAPARIN SODIUM 40 MG/0.4ML IJ SOSY
40.0000 mg | PREFILLED_SYRINGE | INTRAMUSCULAR | Status: DC
  Administered 2021-10-29 – 2021-10-30 (×3): 40 mg via SUBCUTANEOUS
  Filled 2021-10-28 (×3): qty 0.4

## 2021-10-28 MED ORDER — IPRATROPIUM-ALBUTEROL 0.5-2.5 (3) MG/3ML IN SOLN
3.0000 mL | Freq: Once | RESPIRATORY_TRACT | Status: AC
  Administered 2021-10-28: 3 mL via RESPIRATORY_TRACT
  Filled 2021-10-28: qty 3

## 2021-10-28 MED ORDER — SODIUM CHLORIDE 0.9 % IV SOLN
1.0000 g | INTRAVENOUS | Status: DC
  Administered 2021-10-29 (×2): 1 g via INTRAVENOUS
  Filled 2021-10-28 (×2): qty 10
  Filled 2021-10-28: qty 1

## 2021-10-28 MED ORDER — ONDANSETRON HCL 4 MG/2ML IJ SOLN
4.0000 mg | Freq: Four times a day (QID) | INTRAMUSCULAR | Status: DC | PRN
Start: 2021-10-28 — End: 2021-11-12
  Administered 2021-10-29: 4 mg via INTRAVENOUS
  Filled 2021-10-28: qty 2

## 2021-10-28 MED ORDER — SODIUM CHLORIDE 0.9 % IV SOLN: INTRAVENOUS | Status: DC

## 2021-10-28 MED ORDER — METHYLPREDNISOLONE SODIUM SUCC 40 MG IJ SOLR
40.0000 mg | Freq: Two times a day (BID) | INTRAMUSCULAR | Status: AC
  Administered 2021-10-29 (×2): 40 mg via INTRAVENOUS
  Filled 2021-10-28 (×2): qty 1

## 2021-10-28 MED ORDER — TRAZODONE HCL 50 MG PO TABS
25.0000 mg | ORAL_TABLET | Freq: Every evening | ORAL | Status: DC | PRN
  Administered 2021-10-30 – 2021-10-31 (×2): 25 mg via ORAL
  Filled 2021-10-28 (×2): qty 1

## 2021-10-28 MED ORDER — ACETAMINOPHEN 650 MG RE SUPP
650.0000 mg | Freq: Four times a day (QID) | RECTAL | Status: DC | PRN
Start: 2021-10-28 — End: 2021-11-12

## 2021-10-28 MED ORDER — PREDNISONE 20 MG PO TABS
40.0000 mg | ORAL_TABLET | Freq: Every day | ORAL | Status: AC
  Administered 2021-10-30 – 2021-11-02 (×4): 40 mg via ORAL
  Filled 2021-10-28 (×4): qty 2

## 2021-10-28 MED ORDER — ONDANSETRON HCL 4 MG PO TABS: 4.0000 mg | ORAL_TABLET | Freq: Four times a day (QID) | ORAL | Status: DC | PRN

## 2021-10-28 MED ORDER — ACETAMINOPHEN 325 MG PO TABS
650.0000 mg | ORAL_TABLET | Freq: Four times a day (QID) | ORAL | Status: DC | PRN
  Administered 2021-11-09 – 2021-11-11 (×8): 650 mg via ORAL
  Filled 2021-10-28 (×8): qty 2

## 2021-10-28 MED ORDER — MAGNESIUM HYDROXIDE 400 MG/5ML PO SUSP
30.0000 mL | Freq: Every day | ORAL | Status: DC | PRN
  Administered 2021-10-29 – 2021-11-08 (×2): 30 mL via ORAL
  Filled 2021-10-28 (×2): qty 30

## 2021-10-28 MED ORDER — PREDNISONE 20 MG PO TABS
60.0000 mg | ORAL_TABLET | Freq: Once | ORAL | Status: AC
  Administered 2021-10-28: 60 mg via ORAL
  Filled 2021-10-28: qty 3

## 2021-10-28 NOTE — ED Provider Notes (Signed)
? ?[facility] ?Provider Note ? ? ? Event Date/Time  ? First MD Initiated Contact with Patient 10/20/2021 2125   ?  (approximate) ? ? ?History  ? ?Shortness of Breath and Foot Swelling ? ? ?HPI ? ?Virginia Norman is a 61 y.o. female past medical history of CHF, COPD and coronary disease who presents with shortness of breath.  Patient tells me that last night when she went to sleep her oxygen came off when she woke up she was feeling short of breath.  Her sats at home were low off of her oxygen.  She has since felt short of breath.  Has a cough that is productive of clear sputum denies chest pain lower extremity edema.  No fevers chills has had some nausea but no vomiting.  Does feel more fatigued and weak overall. ?  ? ?Past Medical History:  ?Diagnosis Date  ? CHF (congestive heart failure) ([place])   ? COPD (chronic obstructive pulmonary disease) ([place])   ? Coronary artery disease   ? Hypertension   ? ? ?Patient Active Problem List  ? Diagnosis Date Noted  ? Acute on chronic diastolic CHF (congestive heart failure) ([place]) 07/16/2021  ? Acute on chronic diastolic (congestive) heart failure ([place]) 07/16/2021  ? Elevated troponin   ? Hyponatremia   ? Sepsis ([place])   ? Acute respiratory failure with hypoxia and hypercapnia (HCC)   ? Acute diastolic CHF (congestive heart failure) ([place])   ? Acute on chronic respiratory failure with hypoxia (HCC)   ? Benign essential HTN 10/02/2019  ? CAD (coronary artery disease) 10/02/2019  ? COPD exacerbation () 10/02/2019  ? ? ? ?Physical Exam  ?Triage Vital Signs: ?ED Triage Vitals  ?Enc Vitals Group  ?   BP 10/11/2021 1844 110/67  ?   Pulse Rate 10/07/2021 1844 85  ?   Resp 10/29/2021 1844 (!) 24  ?   Temp 10/27/2021 1844 97.6 ?F (36.4 ?C)  ?   Temp Source 11/02/2021 1844 Oral  ?   SpO2 10/05/2021 1844 96 %  ?   Weight 10/06/2021 1845 179 lb (81.2 kg)  ?   Height 10/08/2021 1845 5\' 7"  (1.702 m)  ?   Head Circumference --   ?   Peak Flow --   ?   Pain Score 10/16/2021 1845 0  ?   Pain  Loc --   ?   Pain Edu? --   ?   Excl. in [place]? --   ? ? ?Most recent vital signs: ?Vitals:  ? 11/02/2021 2230 10/21/2021 2300  ?BP: 118/68 135/86  ?Pulse: 85 89  ?Resp: (!) 27 19  ?Temp:    ?SpO2: 93% 99%  ? ? ? ?General: Awake, no distress. Pt is flushed ?CV:  Good peripheral perfusion. No edema ?Resp:  Normal effort. Pt has increased work of breathing and is tachypneic, decreased breath sounds and wheezing ?Abd:  No distention. Soft and nontender ?Neuro:             Awake, Alert, Oriented x 3 patient is somewhat sleepy but does awaken appropriately and has a nonfocal neurologic exam ?Other:   ? ? ?ED Results / Procedures / Treatments  ?Labs ?(all labs ordered are listed, but only abnormal results are displayed) ?Labs Reviewed  ?BASIC METABOLIC PANEL - Abnormal; Notable for the following components:  ?    Result Value  ? Chloride 93 (*)   ? CO2 36 (*)   ? Glucose, Bld 121 (*)   ?  BUN 25 (*)   ? All other components within normal limits  ?CBC WITH DIFFERENTIAL/PLATELET - Abnormal; Notable for the following components:  ? WBC 10.9 (*)   ? RBC 5.66 (*)   ? Hemoglobin 16.4 (*)   ? HCT 53.5 (*)   ? Neutro Abs 9.4 (*)   ? All other components within normal limits  ?BRAIN NATRIURETIC PEPTIDE - Abnormal; Notable for the following components:  ? B Natriuretic Peptide 129.0 (*)   ? All other components within normal limits  ?BLOOD GAS, VENOUS - Abnormal; Notable for the following components:  ? pH, Ven 7.24 (*)   ? pCO2, Ven 98 (*)   ? pO2, Ven 68 (*)   ? Bicarbonate 42.0 (*)   ? Acid-Base Excess 10.4 (*)   ? All other components within normal limits  ?TROPONIN I (HIGH SENSITIVITY) - Abnormal; Notable for the following components:  ? Troponin I (High Sensitivity) 18 (*)   ? All other components within normal limits  ?TROPONIN I (HIGH SENSITIVITY) - Abnormal; Notable for the following components:  ? Troponin I (High Sensitivity) 19 (*)   ? All other components within normal limits  ? ? ? ?EKG ? ?EKG interpretation performed by  myself: NSR, nml axis, nml intervals, no acute ischemic changes ? ? ? ?RADIOLOGY ?I reviewed the CXR which does not show any acute cardiopulmonary process; agree with radiology report  ? ? ? ?PROCEDURES: ? ?Critical Care performed: No ? ?.1-3 Lead EKG Interpretation ?Performed by: Rada Hay, MD ?Authorized by: Rada Hay, MD  ? ?  Interpretation: normal   ?  ECG rate assessment: normal   ?  Ectopy: none   ?  Conduction: normal   ? ?The patient is on the cardiac monitor to evaluate for evidence of arrhythmia and/or significant heart rate changes. ? ? ?MEDICATIONS ORDERED IN ED: ?Medications  ?ipratropium-albuterol (DUONEB) 0.5-2.5 (3) MG/3ML nebulizer solution 3 mL (3 mLs Nebulization Given 10/05/2021 2243)  ?ipratropium-albuterol (DUONEB) 0.5-2.5 (3) MG/3ML nebulizer solution 3 mL (3 mLs Nebulization Given 10/18/2021 2245)  ?ipratropium-albuterol (DUONEB) 0.5-2.5 (3) MG/3ML nebulizer solution 3 mL (3 mLs Nebulization Given 10/21/2021 2244)  ?predniSONE (DELTASONE) tablet 60 mg (60 mg Oral Given 10/09/2021 2238)  ? ? ? ?IMPRESSION / MDM / ASSESSMENT AND PLAN / ED COURSE  ?I reviewed the triage vital signs and the nursing notes. ?             ?               ? ?Differential diagnosis includes, but is not limited to, exacerbation of COPD, viral illness, pneumonia, less likely ACS, heart failure exacerbation, hypercarbia ? ?The patient is a 61 year old female with history of COPD presents with shortness of breath.  Seems to have been triggered by having slept overnight with her oxygen off by accident.  Has a cough that is productive of clear sputum no chest pain or lower extremity swelling no fevers.  On exam she is somewhat sleepy but awakens appropriately.  Does appear winded and has decreased breath sounds with some wheezing.  She has no lower extreme edema abdomen is nontender.  Chest x-ray reviewed by myself does not show any obvious infiltrate.  Labs overall reassuring, troponin is flat around 18 EKG does not  show ischemic change.  I suspect she is having underlying exacerbation of COPD given her somnolence will obtain a VBG to ensure she does not require BiPAP.  We will treat with DuoNebs and prednisone.  Will need reassessment.   ? ?VBG shows significant hypercarbia with a pH of 7-4 and a CO2 of 98.  We will place patient on BiPAP.  Will admit to the hospital service. ? ?  ? ? ?FINAL CLINICAL IMPRESSION(S) / ED DIAGNOSES  ? ?Final diagnoses:  ?COPD exacerbation ([place])  ?Acute respiratory failure with hypoxia and hypercarbia (HCC)  ? ? ? ?Rx / DC Orders  ? ?ED Discharge Orders   ? ? None  ? ?  ? ? ? ?Note:  This document was prepared using Dragon voice recognition software and may include unintentional dictation errors. ?  ?Rada Hay, MD ?11/02/2021 2309 ? ?

## 2021-10-28 NOTE — ED Notes (Signed)
Dr. Mansey at bedside  ?

## 2021-10-28 NOTE — ED Notes (Signed)
ED Provider at bedside. 

## 2021-10-28 NOTE — ED Triage Notes (Signed)
Pt to ED for SOB since yesterday and bilateral foot swelling (feet do not appear swollen). Pt has hx COPD and CHF and is former smoker. Wears 3.5L chronic oxygen, states she turned oxygen to 4.5 L yesterday because SPO2 was 69%. States connector hose had disconnected on home and she may have gone all night without oxygen before this. ? ?Pt arrived with home oxygen concentrator, changed to hospital oxygen tank at 4L and SPO2 is 90-95%. ? ?Pt has wet cough since a few days ago. Former smoker, COPD. Denies chest pain.  ? ?Pt states at this time she is actually not SOB. She had just gone all night without oxygen and it took some time to catch up. ?

## 2021-10-28 NOTE — ED Notes (Signed)
Respiratory at bedside placing pt on bipap

## 2021-10-28 NOTE — Telephone Encounter (Signed)
Patient did not show for her [facility] appointment on 11/14/21. Will attempt to reschedule.   ?

## 2021-10-29 DIAGNOSIS — I5032 Chronic diastolic (congestive) heart failure: Secondary | ICD-10-CM

## 2021-10-29 LAB — RESPIRATORY PANEL BY PCR

## 2021-10-29 LAB — BASIC METABOLIC PANEL
Anion gap: 9 (ref 5–15)
BUN: 31 mg/dL — ABNORMAL HIGH (ref 8–23)
CO2: 36 mmol/L — ABNORMAL HIGH (ref 22–32)
Calcium: 8.8 mg/dL — ABNORMAL LOW (ref 8.9–10.3)
Chloride: 92 mmol/L — ABNORMAL LOW (ref 98–111)
Creatinine, Ser: 0.52 mg/dL (ref 0.44–1.00)
GFR, Estimated: >60 mL/min (ref >60)
Glucose, Bld: 137 mg/dL — ABNORMAL HIGH (ref 70–99)
Potassium: 4.6 mmol/L (ref 3.5–5.1)
Sodium: 137 mmol/L (ref 135–145)

## 2021-10-29 LAB — RESP PANEL BY RT-PCR (FLU A&B, COVID) ARPGX2
Influenza A by PCR: NEGATIVE
Influenza B by PCR: NEGATIVE
SARS Coronavirus 2 by RT PCR: NEGATIVE

## 2021-10-29 LAB — CBC
HCT: 51.3 % — ABNORMAL HIGH (ref 36.0–46.0)
Hemoglobin: 15.6 g/dL — ABNORMAL HIGH (ref 12.0–15.0)
MCH: 29 pg (ref 26.0–34.0)
MCHC: 30.4 g/dL (ref 30.0–36.0)
MCV: 95.4 fL (ref 80.0–100.0)
Platelets: 207 10*3/uL (ref 150–400)
RBC: 5.38 MIL/uL — ABNORMAL HIGH (ref 3.87–5.11)
RDW: 13.2 % (ref 11.5–15.5)
WBC: 8.3 10*3/uL (ref 4.0–10.5)
nRBC: 0 % (ref 0.0–0.2)

## 2021-10-29 LAB — C-REACTIVE PROTEIN: CRP: 4.2 mg/dL — ABNORMAL HIGH (ref <1.0)

## 2021-10-29 MED ORDER — IPRATROPIUM-ALBUTEROL 0.5-2.5 (3) MG/3ML IN SOLN
3.0000 mL | Freq: Four times a day (QID) | RESPIRATORY_TRACT | Status: DC
Start: 2021-10-29 — End: 2021-10-31
  Administered 2021-10-29 – 2021-10-31 (×9): 3 mL via RESPIRATORY_TRACT
  Filled 2021-10-29 (×9): qty 3

## 2021-10-29 MED ORDER — HYDROCHLOROTHIAZIDE 12.5 MG PO TABS
12.5000 mg | ORAL_TABLET | Freq: Every day | ORAL | Status: DC
  Administered 2021-10-29 – 2021-10-30 (×2): 12.5 mg via ORAL
  Filled 2021-10-29 (×2): qty 1

## 2021-10-29 MED ORDER — SPIRONOLACTONE 25 MG PO TABS
25.0000 mg | ORAL_TABLET | Freq: Every day | ORAL | Status: DC
  Administered 2021-10-30 – 2021-10-31 (×2): 25 mg via ORAL
  Filled 2021-10-29 (×2): qty 1

## 2021-10-29 MED ORDER — HYDROCOD POLI-CHLORPHE POLI ER 10-8 MG/5ML PO SUER: 5.0000 mL | Freq: Two times a day (BID) | ORAL | Status: DC | PRN

## 2021-10-29 MED ORDER — TIOTROPIUM BROMIDE MONOHYDRATE 18 MCG IN CAPS
18.0000 ug | ORAL_CAPSULE | Freq: Every day | RESPIRATORY_TRACT | Status: DC
  Administered 2021-10-30 – 2021-11-02 (×4): 18 ug via RESPIRATORY_TRACT
  Filled 2021-10-29 (×3): qty 5

## 2021-10-29 MED ORDER — GUAIFENESIN ER 600 MG PO TB12
600.0000 mg | ORAL_TABLET | Freq: Two times a day (BID) | ORAL | Status: DC
  Administered 2021-10-29 – 2021-11-08 (×21): 600 mg via ORAL
  Filled 2021-10-29 (×22): qty 1

## 2021-10-29 MED ORDER — ASPIRIN EC 81 MG PO TBEC
81.0000 mg | DELAYED_RELEASE_TABLET | Freq: Every day | ORAL | Status: DC
Start: 2021-10-29 — End: 2021-10-31
  Administered 2021-10-29 – 2021-10-31 (×3): 81 mg via ORAL
  Filled 2021-10-29 (×3): qty 1

## 2021-10-29 MED ORDER — TORSEMIDE 20 MG PO TABS
20.0000 mg | ORAL_TABLET | Freq: Every day | ORAL | Status: DC
  Administered 2021-10-29 – 2021-10-31 (×3): 20 mg via ORAL
  Filled 2021-10-29 (×3): qty 1

## 2021-10-29 MED ORDER — POTASSIUM CHLORIDE CRYS ER 20 MEQ PO TBCR
20.0000 meq | EXTENDED_RELEASE_TABLET | Freq: Every day | ORAL | Status: DC
  Administered 2021-10-29: 20 meq via ORAL
  Filled 2021-10-29: qty 1

## 2021-10-29 MED ORDER — ADULT MULTIVITAMIN W/MINERALS CH
1.0000 | ORAL_TABLET | Freq: Every day | ORAL | Status: DC
  Administered 2021-10-29 – 2021-11-05 (×8): 1 via ORAL
  Filled 2021-10-29 (×8): qty 1

## 2021-10-29 MED ORDER — EMPAGLIFLOZIN 10 MG PO TABS
10.0000 mg | ORAL_TABLET | Freq: Every day | ORAL | Status: DC
  Administered 2021-10-29 – 2021-11-03 (×6): 10 mg via ORAL
  Filled 2021-10-29 (×8): qty 1

## 2021-10-29 NOTE — ED Notes (Signed)
RT made aware pt noted w/ SPO2=86-88% on BiPAP, FiO2 increased to 45% ?

## 2021-10-29 NOTE — ED Notes (Signed)
Pt trialed on 4L nasal cannula at this time. Pt oxygen saturation remained above 92% and pt reported no dyspnea or SOB. Respirations even and unlabored. Pt has call bell in reach and instructed to let us know if breathing worsened. Pt has family at bedside. Will continue to monitor.  ?

## 2021-10-29 NOTE — Assessment & Plan Note (Signed)
-   We will continue her Jardiance. ?

## 2021-10-29 NOTE — H&P (Signed)
?  ?  ?[facility] ? ? ?PATIENT NAME: Virginia Norman   ? ?MR#:  [id number] ? ?DATE OF BIRTH:  05/27/1961 ? ?DATE OF ADMISSION:  07-31-21 ? ?PRIMARY CARE PHYSICIAN: [facility]  ? ?Patient is coming from: Home ? ?REQUESTING/REFERRING PHYSICIAN: Randol KernMcHugh, Kelly M, MD ? ?CHIEF COMPLAINT:  ? ?Chief Complaint  ?Patient presents with  ? Shortness of Breath  ? Foot Swelling  ? ? ?HISTORY OF PRESENT ILLNESS:  ?Virginia Norman is a 61 y.o. female with medical history significant for CHF, COPD, coronary artery disease and hypertension, who presented to the emergency room with a [place] of dyspnea since this morning with hypoxia of 69% on room air as her oxygen fell off at night.  She has been having cough productive of clear sputum as well as wheezing.  No fever or chills.  No chest pain or palpitations.  She admitted to mild diarrhea without melena or bright red bleeding per rectum.  No recent antibiotics.  No dysuria, oliguria or hematuria or flank pain ? ?When she came to the ER she was in significant respiratory distress she was placed on BiPAP. ? ?ED Course: Upon arrival to the ER BP was respiratory to 33 with otherwise normal vital signs.  Labs revealed pH 7.24 with a PCO2 of 98 and PO2 68 HCO3 42 and therefore she was placed on BiPAP.  High-sensitivity troponin was 18 and later 19 and BNP 129.  CBC showed mild cytosis of 10.9.  BMP showed a low chloride of 93 and a CO2 36 with a glucose of 121 BUN of 25 with creatinine 0.65 ?EKG as reviewed by me : Showed normal sinus rhythm with a rate of 86. ?Imaging: Two-view chest x-ray showed no acute abnormality.  Previous patchy airspace disease has essentially resolved with minimal residual subsegmental atelectasis at the left lung base. ? ?The patient was given nebulized DuoNeb X3 and 60 mg of p.o. prednisone.  She will be admitted to a PCU bed for further evaluation and management. ?PAST MEDICAL HISTORY:  ? ?Past Medical History:  ?Diagnosis Date  ? CHF  (congestive heart failure) (HCC)   ? COPD (chronic obstructive pulmonary disease) (HCC)   ? Coronary artery disease   ? Hypertension   ? ? ?PAST SURGICAL HISTORY:  ? ?Past Surgical History:  ?Procedure Laterality Date  ? ABDOMINAL HYSTERECTOMY    ? ? ?SOCIAL HISTORY:  ? ?Social History  ? ?Tobacco Use  ? Smoking status: Former  ?  Packs/day: 0.75  ?  Years: 44.00  ?  Pack years: 33.00  ?  Types: Cigarettes  ? Smokeless tobacco: Never  ?Substance Use Topics  ? Alcohol use: Never  ? ? ?FAMILY HISTORY:  ? ?Family History  ?Problem Relation Age of Onset  ? Hypertension Mother   ? Heart disease Mother   ? Stomach cancer Father   ? Lupus Sister   ? ? ?DRUG ALLERGIES:  ?No Known Allergies ? ?REVIEW OF SYSTEMS:  ? ?ROS ?As per history of present illness. All pertinent systems were reviewed above. Constitutional, HEENT, cardiovascular, respiratory, GI, GU, musculoskeletal, neuro, psychiatric, endocrine, integumentary and hematologic systems were reviewed and are otherwise negative/unremarkable except for positive findings mentioned above in the HPI. ? ? ?MEDICATIONS AT HOME:  ? ?Prior to Admission medications   ?Medication Sig Start Date End Date Taking? Authorizing Provider  ?albuterol (VENTOLIN HFA) 108 (90 Base) MCG/ACT inhaler Inhale 1-2 puffs into the lungs every 6 (six) hours as needed for wheezing or  shortness of breath. 09/01/21   Delma Freeze, FNP  ?aspirin EC 81 MG tablet Take 81 mg by mouth daily.    [provider]  ?empagliflozin (JARDIANCE) 10 MG TABS tablet Take 1 tablet (10 mg total) by mouth daily before breakfast. 07/30/21   Delma Freeze, FNP  ?hydrochlorothiazide (HYDRODIURIL) 12.5 MG tablet Take 1 tablet (12.5 mg total) by mouth daily. 07/22/21   Noralee Stain, DO  ?mometasone-formoterol (DULERA) 100-5 MCG/ACT AERO Inhale 2 puffs into the lungs in the morning and at bedtime. 07/22/21   Noralee Stain, DO  ?Multiple Vitamins-Minerals (MULTIVITAMIN WITH MINERALS) tablet Take 1 tablet by mouth  daily.    [provider]  ?potassium chloride SA (KLOR-CON M) 20 MEQ tablet Take 1 tablet (20 mEq total) by mouth daily. 09/02/21   Delma Freeze, FNP  ?tiotropium (SPIRIVA) 18 MCG inhalation capsule Place 18 mcg into inhaler and inhale daily.    [provider]  ? ?  ? ?VITAL SIGNS:  ?Blood pressure (!) 166/92, pulse 94, temperature 97.6 ?F (36.4 ?C), temperature source Oral, resp. rate 19, height 5\' 7"  (1.702 m), weight 81.2 kg, SpO2 98 %. ? ?PHYSICAL EXAMINATION:  ?Physical Exam ? ?GENERAL:  61 y.o.-year-old patient lying in the bed with mild respiratory distress on BiPAP. ?EYES: Pupils equal, round, reactive to light and accommodation. No scleral icterus. Extraocular muscles intact.  ?HEENT: Head atraumatic, normocephalic. Oropharynx and nasopharynx clear.  ?NECK:  Supple, no jugular venous distention. No thyroid enlargement, no tenderness.  ?LUNGS: Diminished bibasilar breath sounds with diffuse expiratory wheezes and diminished expiratory airflow with harsh vesicular breathing.   ?CARDIOVASCULAR: Regular rate and rhythm, S1, S2 normal. No murmurs, rubs, or gallops.  ?ABDOMEN: Soft, nondistended, nontender. Bowel sounds present. No organomegaly or mass.  ?EXTREMITIES: No pedal edema, cyanosis, or clubbing.  ?NEUROLOGIC: Cranial nerves II through XII are intact. Muscle strength 5/5 in all extremities. Sensation intact. Gait not checked.  ?PSYCHIATRIC: The patient is alert and oriented x 3.  Normal affect and good eye contact. ?SKIN: No obvious rash, lesion, or ulcer.  ? ?LABORATORY PANEL:  ? ?CBC ?Recent Labs  ?Lab 10/24/2021 ?1850  ?WBC 10.9*  ?HGB 16.4*  ?HCT 53.5*  ?PLT 233  ? ?------------------------------------------------------------------------------------------------------------------ ? ?Chemistries  ?Recent Labs  ?Lab 10/04/2021 ?1850  ?NA 139  ?K 5.0  ?CL 93*  ?CO2 36*  ?GLUCOSE 121*  ?BUN 25*  ?CREATININE 0.65  ?CALCIUM 9.0   ? ?------------------------------------------------------------------------------------------------------------------ ? ?Cardiac Enzymes ?No results for input(s): TROPONINI in the last 168 hours. ?------------------------------------------------------------------------------------------------------------------ ? ?RADIOLOGY:  ?DG Chest 2 View ? ?Result Date: 10/24/2021 ?CLINICAL DATA:  Shortness of breath. Bilateral foot swelling. Patient reports history of CHF, COPD, hypertension and prior smoking history. EXAM: CHEST - 2 VIEW COMPARISON:  Radiograph and CT 07/16/2021 FINDINGS: Upper normal heart size. Unchanged mediastinal contours with aortic atherosclerosis. Previous patchy airspace disease has resolved with minimal residual subsegmental atelectasis at the left lung base. No pulmonary edema. No pleural effusion. No pneumothorax. Chronic changes in the thoracic spine. IMPRESSION: No acute abnormality. Previous patchy airspace disease has essentially resolved with minimal residual subsegmental atelectasis at the left lung base. Electronically Signed   By: 09/13/2021 M.D.   On: 10/26/2021 20:52   ? ? ? ?IMPRESSION AND PLAN:  ?Assessment and Plan: ?* Acute respiratory failure with hypoxia and hypercapnia (HCC) ?- The patient be admitted to a PCU bed. ?We will continue her BiPAP. ?- We will follow ABGs. ?We will continue steroid therapy with IV  Solu-Medrol and bronchodilator therapy with DuoNebs. ?- O2 protocol will be followed. ?- This is likely secondary to COPD exacerbation. ? ?COPD exacerbation (HCC) ?- The patient be placed on bronchodilator therapy with DuoNebs ?- We will continue steroid therapy with IV Solu-Medrol. ?- We will continue her on antibiotic therapy with IV Rocephin. ?- Mucolytic therapy will be provided. ?- We will hold off Dulera and continue Spiriva. ? ?Benign essential HTN ?- We will continue her antihypertensives. ? ?Chronic diastolic CHF (congestive heart failure) (HCC) ?- We will  continue her Jardiance. ? ? ?DVT prophylaxis: Lovenox. ?Advanced Care Planning:  Code Status: full code. ?Family Communication:  The plan of care was discussed in details with the patient (and family). ?I answered all questions. The patient agreed to proceed with the abov

## 2021-10-29 NOTE — Assessment & Plan Note (Signed)
-   The patient be admitted to a PCU bed. ?We will continue her BiPAP. ?- We will follow ABGs. ?We will continue steroid therapy with IV Solu-Medrol and bronchodilator therapy with DuoNebs. ?- O2 protocol will be followed. ?- This is likely secondary to COPD exacerbation. ?

## 2021-10-29 NOTE — Assessment & Plan Note (Addendum)
-   The patient be placed on bronchodilator therapy with DuoNebs ?- We will continue steroid therapy with IV Solu-Medrol. ?- We will continue her on antibiotic therapy with IV Rocephin. ?- Mucolytic therapy will be provided. ?- We will hold off Dulera and continue Spiriva. ?

## 2021-10-29 NOTE — Care Plan (Signed)
This 60 years old female with PMH significant for CHF, COPD, coronary artery disease, hypertension presented to the ED with complaints of severe shortness of breath.  She was hypoxic SPO2 69% on room air his oxygen fell off at night.  Patient also reports productive cough, she was found to have wheezing on exam.  ABG shows pH 7.2 with PCO2 of 98%.  Patient was started on BiPAP.  Patient was admitted for acute on chronic hypoxic and hypercarbic respiratory failure secondary to CO2 retention.  Patient was started on steroids and nebulized bronchodilators.  Patient has been doing better, seen and examined at bedside. ?

## 2021-10-29 NOTE — Consult Note (Signed)
? ? ? ?PULMONOLOGY ? ? ? ? ? ? ? ? ?Date: 10/29/2021,   ?MRN# [id number] Yocelyn Corban 1960-09-09 ? ? ?  ?AdmissionWeight: 81.2 kg                 ?CurrentWeight: 81.2 kg ? ?Referring provider: Dr Dwyane Dee ? ? ?CHIEF COMPLAINT:  ? ?Acute COPD exacerbation ? ? ?HISTORY OF PRESENT ILLNESS  ? ?Patient with hx of COPD who was a life long smoker who quit smoking few months ago after last hospitalization for acute COPD exacerbation . She has CHF and CAD with HTN. She is accompanied by husband to day who helps with details of history.  She was noted by family to be acutely short of breath with acute hypoxemia and she noted no swelling acutely and had no chest pain. She uses O2 for chronic hypoxemia and is on 3-4L/min. Despite this she was still very weak and could not ventilate.  ? ? ?PAST MEDICAL HISTORY  ? ?Past Medical History:  ?Diagnosis Date  ? CHF (congestive heart failure) ([place])   ? COPD (chronic obstructive pulmonary disease) ([place])   ? Coronary artery disease   ? Hypertension   ? ? ? ?SURGICAL HISTORY  ? ?Past Surgical History:  ?Procedure Laterality Date  ? ABDOMINAL HYSTERECTOMY    ? ? ? ?FAMILY HISTORY  ? ?Family History  ?Problem Relation Age of Onset  ? Hypertension Mother   ? Heart disease Mother   ? Stomach cancer Father   ? Lupus Sister   ? ? ? ?SOCIAL HISTORY  ? ?Social History  ? ?Tobacco Use  ? Smoking status: Former  ?  Packs/day: 0.75  ?  Years: 44.00  ?  Pack years: 33.00  ?  Types: Cigarettes  ? Smokeless tobacco: Never  ?Vaping Use  ? Vaping Use: Never used  ?Substance Use Topics  ? Alcohol use: Never  ? Drug use: Never  ? ? ? ?MEDICATIONS  ? ? ?Home Medication:  ?  ?Current Medication: ? ?Current Facility-Administered Medications:  ?  0.9 %  sodium chloride infusion, , Intravenous, Continuous, Mansy, Jan A, MD, Last Rate: 100 mL/hr at 10/29/21 0900, New Bag at 10/29/21 0900 ?  acetaminophen (TYLENOL) tablet 650 mg, 650 mg, Oral, Q6H PRN **OR** acetaminophen (TYLENOL) suppository 650 mg, 650 mg,  Rectal, Q6H PRN, Mansy, Jan A, MD ?  aspirin EC tablet 81 mg, 81 mg, Oral, Daily, Mansy, Jan A, MD, 81 mg at 10/29/21 0801 ?  cefTRIAXone (ROCEPHIN) 1 g in sodium chloride 0.9 % 100 mL IVPB, 1 g, Intravenous, Q24H, Mansy, Arvella Merles, MD, Stopped at 10/29/21 0058 ?  chlorpheniramine-HYDROcodone 10-8 MG/5ML suspension 5 mL, 5 mL, Oral, Q12H PRN, Mansy, Jan A, MD ?  empagliflozin (JARDIANCE) tablet 10 mg, 10 mg, Oral, QAC breakfast, Mansy, Jan A, MD, 10 mg at 10/29/21 [id number] ?  enoxaparin (LOVENOX) injection 40 mg, 40 mg, Subcutaneous, Q24H, Mansy, Jan A, MD, 40 mg at 10/29/21 0025 ?  guaiFENesin (MUCINEX) 12 hr tablet 600 mg, 600 mg, Oral, BID, Mansy, Jan A, MD, 600 mg at 10/29/21 0801 ?  hydrochlorothiazide (HYDRODIURIL) tablet 12.5 mg, 12.5 mg, Oral, Daily, Mansy, Jan A, MD, 12.5 mg at 10/29/21 0801 ?  ipratropium-albuterol (DUONEB) 0.5-2.5 (3) MG/3ML nebulizer solution 3 mL, 3 mL, Nebulization, QID, Mansy, Jan A, MD, 3 mL at 10/29/21 1626 ?  magnesium hydroxide (MILK OF MAGNESIA) suspension 30 mL, 30 mL, Oral, Daily PRN, Mansy, Jan A, MD ?  multivitamin with minerals tablet 1  tablet, 1 tablet, Oral, Daily, Mansy, Jan A, MD, 1 tablet at 10/29/21 0802 ?  ondansetron (ZOFRAN) tablet 4 mg, 4 mg, Oral, Q6H PRN **OR** ondansetron (ZOFRAN) injection 4 mg, 4 mg, Intravenous, Q6H PRN, Mansy, Jan A, MD, 4 mg at 10/29/21 0024 ?  potassium chloride SA (KLOR-CON M) CR tablet 20 mEq, 20 mEq, Oral, Daily, Mansy, Jan A, MD, 20 mEq at 10/29/21 0801 ?  [COMPLETED] methylPREDNISolone sodium succinate (SOLU-MEDROL) 40 mg/mL injection 40 mg, 40 mg, Intravenous, Q12H, 40 mg at 10/29/21 1214 **FOLLOWED BY** [START ON 10/30/2021] predniSONE (DELTASONE) tablet 40 mg, 40 mg, Oral, Q breakfast, Mansy, Jan A, MD ?  tiotropium [facility]) inhalation capsule ([facility] use ONLY) 18 mcg, 18 mcg, Inhalation, Daily, Mansy, Jan A, MD ?  traZODone (DESYREL) tablet 25 mg, 25 mg, Oral, QHS PRN, Mansy, Arvella Merles, MD ? ? ? ?ALLERGIES  ? ?Patient has no known  allergies. ? ? ? ? ?REVIEW OF SYSTEMS  ? ? ?Review of Systems: ? ?Gen:  Denies  fever, sweats, chills weigh loss  ?HEENT: Denies blurred vision, double vision, ear pain, eye pain, hearing loss, nose bleeds, sore throat ?Cardiac:  No dizziness, chest pain or heaviness, chest tightness,edema ?Resp:   reports dyspnea chronically  ?Gi: Denies swallowing difficulty, stomach pain, nausea or vomiting, diarrhea, constipation, bowel incontinence ?Gu:  Denies bladder incontinence, burning urine ?Ext:   Denies Joint pain, stiffness or swelling ?Skin: Denies  skin rash, easy bruising or bleeding or hives ?Endoc:  Denies polyuria, polydipsia , polyphagia or weight change ?Psych:   Denies depression, insomnia or hallucinations  ? ?Other:  All other systems negative ? ? ?VS: BP (!) 142/86 (BP Location: Left Arm)   Pulse 86   Temp 98.1 ?F (36.7 ?C)   Resp 18   Ht 5\' 7"  (1.702 m)   Wt 81.2 kg   SpO2 96%   BMI 28.04 kg/m?   ? ? ? ?PHYSICAL EXAM  ? ? ?GENERAL:NAD, no fevers, chills, no weakness no fatigue ?HEAD: Normocephalic, atraumatic.  ?EYES: Pupils equal, round, reactive to light. Extraocular muscles intact. No scleral icterus.  ?MOUTH: Moist mucosal membrane. Dentition intact. No abscess noted.  ?EAR, NOSE, THROAT: Clear without exudates. No external lesions.  ?NECK: Supple. No thyromegaly. No nodules. No JVD.  ?PULMONARY: decreased breath sounds with mild rhonchi worse at bases bilaterally.  ?CARDIOVASCULAR: S1 and S2. Regular rate and rhythm. No murmurs, rubs, or gallops. No edema. Pedal pulses 2+ bilaterally.  ?GASTROINTESTINAL: Soft, nontender, nondistended. No masses. Positive bowel sounds. No hepatosplenomegaly.  ?MUSCULOSKELETAL: No swelling, clubbing, or edema. Range of motion full in all extremities.  ?NEUROLOGIC: Cranial nerves II through XII are intact. No gross focal neurological deficits. Sensation intact. Reflexes intact.  ?SKIN: No ulceration, lesions, rashes, or cyanosis. Skin warm and dry. Turgor intact.   ?PSYCHIATRIC: Mood, affect within normal limits. The patient is awake, alert and oriented x 3. Insight, judgment intact.  ? ? ?  ? ?IMAGING  ? ? ? ?ASSESSMENT/PLAN  ? ?Acute on chronic hypoxemic respiratory failure ?   Due to pneumonia worse on right with mild pleural effusion.  ?   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   - she had mild elevation of BNP ?    - repeat TTE ?     ? ?Moderate COPD exacerbation  ?    Agree with current antimicrobials and steroids via prednisone ? ? ? Bilateral pleural effusions ? I suspect this is CHF related . Will  diurese for now and re-evaluate CXR ? ? ?Bibasilar atelectasis ?   - CXR post Metaneb with albuterol   ?    PT/OT  ?    Incentive spirometry  ? ? ? ? ? ? ? ?Thank you for allowing me to participate in the care of this patient.  ? ?Patient/Family are satisfied with care plan and all questions have been answered.  ? ? ?Provider disclosure: ?Patient with at least one acute or chronic illness or injury that poses a threat to life or bodily function and is being managed actively during this encounter.  All of the below services have been performed independently by signing provider:  review of prior documentation from internal and or external health records.  Review of previous and current lab results.  Interview and comprehensive assessment during patient visit today. Review of current and previous chest radiographs/CT scans. Discussion of management and test interpretation with health care team and patient/family.  ? ?This document was prepared using Dragon voice recognition software and may include unintentional dictation errors. ? ? ?  ?Ottie Glazier, M.D.  ?Division of Pulmonary & Critical Care Medicine  ? ? ? ? ? ? ? ? ?

## 2021-10-29 NOTE — Assessment & Plan Note (Signed)
-   We will continue her antihypertensives. 

## 2021-10-30 ENCOUNTER — Inpatient Hospital Stay
Admit: 2021-10-30 | Discharge: 2021-10-30 | Disposition: A | Discharge status: Home / Self Care | Payer: Self-pay | Attending: Pulmonary Disease | Admitting: Pulmonary Disease

## 2021-10-30 LAB — ECHOCARDIOGRAM COMPLETE
AR max vel: 1.94 cm2
AV Area VTI: 2.59 cm2
AV Area mean vel: 2.11 cm2
AV Mean grad: 10 mmHg
AV Peak grad: 21.5 mmHg
Ao pk vel: 2.32 m/s
Area-P 1/2: 5.34 cm2
Height: 67 in
MV VTI: 3.55 cm2
S' Lateral: 2.4 cm
Weight: 2864 oz

## 2021-10-30 MED ORDER — FUROSEMIDE 10 MG/ML IJ SOLN
40.0000 mg | Freq: Once | INTRAMUSCULAR | Status: AC
Start: 2021-10-30 — End: 2021-10-30
  Administered 2021-10-30: 40 mg via INTRAVENOUS
  Filled 2021-10-30: qty 4

## 2021-10-30 NOTE — Progress Notes (Signed)
?PROGRESS NOTE ? ? ? ?Arline AspCindy Steffenhagen  ZOX:[id number] DOB: 01-May-1961 DOA: 10-29-21 ?PCP: [facility]  ? ? ?Brief Narrative:  ?This 61 years old female with PMH significant for CHF, COPD, coronary artery disease, hypertension presented to the ED with complaints of severe shortness of breath.  She was hypoxic SPO2 69% on room air, her oxygen fell off at night.  Patient also reports productive cough, she was found to have wheezing on exam.  ABG shows pH 7.2 with PCO2 of 98%.  Patient was started on BiPAP.  Patient was admitted for acute on chronic hypoxic and hypercarbic respiratory failure secondary to CO2 retention. Patient was started on steroids and nebulized bronchodilators.  Patient has been doing better, pulmonology consulted recommended to continue diuretics.  RVP positive for parainfluenza virus.  Antibiotic discontinued. ? ? ?Assessment & Plan: ?  ?Principal Problem: ?  Acute respiratory failure with hypoxia and hypercapnia (HCC) ?Active Problems: ?  COPD exacerbation (HCC) ?  Benign essential HTN ?  Chronic diastolic CHF (congestive heart failure) (HCC) ? ?Acute on chronic hypoxic and hypercarbic respiratory failure: ?Patient presented with SPO2 69% on room air requiring BiPAP on arrival. ?ABG shows pH 7.2 with PCO2 of 98%. ?Continue BiPAP as needed and at night. ?Continue IV Solu-Medrol and nebulized bronchodilators. ?Continue supplemental oxygen and wean as tolerated. ?Could be multifactorial COPD and CHF exacerbation. ?BNP not significantly elevated. ?Pulmonology consulted recommended to continue diuretic therapy. ?RVP positive for parainfluenza.  Antibiotics discontinued ? ?COPD exacerbation: ?Continue nebulized bronchodilators with DuoNeb. ?Continue with steroid therapy with IV Solu-Medrol. ?Patient was empirically started on IV antibiotics. ?RVP shows parainfluenza virus , antibiotic discontinued ?Continue mucolytic therapy. ? ?Bilateral pleural effusion: ?Likely related to CHF.   Continue IV diuresis. ?Repeat chest x-ray in the morning. ?Obtain 2D echocardiogram. ? ?Continue diastolic CHF: ?BNP not significantly elevated. ?Continue torsemide, Aldactone. ?Obtain 2D echocardiogram. ? ? ?Essential hypertension: ?Continue home blood pressure medication ? ?DVT prophylaxis: Lovenox ?Code Status: Full code ?Family Communication: No family at bedside ?Disposition Plan: ? ?Status is: Inpatient ?Remains inpatient appropriate because: Admitted for acute on chronic hypoxic and hypercarbic respiratory failure secondary to possible COPD and CHF exacerbation requiring IV diuresis IV Solu-Medrol and antibiotics ?  ? ?Consultants:  ?Pulmonology ? ?Procedures: Chest x-ray ? ?Antimicrobials: ?Anti-infectives (From admission, onward)  ? ? Start     Dose/Rate Route Frequency Ordered Stop  ? 2021-12-17 2345  cefTRIAXone (ROCEPHIN) 1 g in sodium chloride 0.9 % 100 mL IVPB       ? 1 g ?200 mL/hr over 30 Minutes Intravenous Every 24 hours 2021-12-17 2338 11/02/21 2159  ? ?  ?  ? ?Subjective: ?Patient was seen and examined at bedside.  Overnight events noted.   ?Patient reports feeling better. She is lying comfortably,  having breakfast. ? ?Objective: ?Vitals:  ? 10/30/21 0315 10/30/21 0456 10/30/21 0654 10/30/21 0840  ?BP: (!) 160/86 (!) 130/95 123/84 103/74  ?Pulse: 87 (!) 140 97 89  ?Resp: 18 20 18    ?Temp: 98.1 ?F (36.7 ?C)   97.8 ?F (36.6 ?C)  ?TempSrc: Oral   Oral  ?SpO2: 93% 93% 95% 92%  ?Weight:      ?Height:      ? ? ?Intake/Output Summary (Last 24 hours) at 10/30/2021 1443 ?Last data filed at 10/30/2021 1421 ?Gross per 24 hour  ?Intake 2026.92 ml  ?Output 2000 ml  ?Net 26.92 ml  ? ?Filed Weights  ? 2021-12-17 1845  ?Weight: 81.2 kg  ? ? ?Examination: ? ?General  exam: Comfortable, not in any acute distress.  Deconditioned ?Respiratory system: CTA bilaterally, no wheezing, no crackles, normal respiratory effort. ?Cardiovascular system: S1-S2 heard, regular rate and rhythm, no murmur. ?Gastrointestinal system: Abdomen  is soft, non tender, non distended, BS+ ?Central nervous system: Alert and oriented x 3. No focal neurological deficits. ?Extremities: No edema, no cyanosis, no clubbing. ?Skin: No rashes, lesions or ulcers ?Psychiatry: Judgement and insight appear normal. Mood & affect appropriate.  ? ? ? ?Data Reviewed: I have personally reviewed following labs and imaging studies ? ?CBC: ?Recent Labs  ?Lab 02-Nov-2021 ?1850 10/29/21 ?0552  ?WBC 10.9* 8.3  ?NEUTROABS 9.4*  --   ?HGB 16.4* 15.6*  ?HCT 53.5* 51.3*  ?MCV 94.5 95.4  ?PLT 233 207  ? ?Basic Metabolic Panel: ?Recent Labs  ?Lab 11/02/2021 ?1850 10/29/21 ?0552  ?NA 139 137  ?K 5.0 4.6  ?CL 93* 92*  ?CO2 36* 36*  ?GLUCOSE 121* 137*  ?BUN 25* 31*  ?CREATININE 0.65 0.52  ?CALCIUM 9.0 8.8*  ? ?GFR: ?Estimated Creatinine Clearance: 79.9 mL/min (by C-G formula based on SCr of 0.52 mg/dL). ?Liver Function Tests: ?No results for input(s): AST, ALT, ALKPHOS, BILITOT, PROT, ALBUMIN in the last 168 hours. ?No results for input(s): LIPASE, AMYLASE in the last 168 hours. ?No results for input(s): AMMONIA in the last 168 hours. ?Coagulation Profile: ?No results for input(s): INR, PROTIME in the last 168 hours. ?Cardiac Enzymes: ?No results for input(s): CKTOTAL, CKMB, CKMBINDEX, TROPONINI in the last 168 hours. ?BNP (last 3 results) ?No results for input(s): PROBNP in the last 8760 hours. ?HbA1C: ?No results for input(s): HGBA1C in the last 72 hours. ?CBG: ?No results for input(s): GLUCAP in the last 168 hours. ?Lipid Profile: ?No results for input(s): CHOL, HDL, LDLCALC, TRIG, CHOLHDL, LDLDIRECT in the last 72 hours. ?Thyroid Function Tests: ?No results for input(s): TSH, T4TOTAL, FREET4, T3FREE, THYROIDAB in the last 72 hours. ?Anemia Panel: ?No results for input(s): VITAMINB12, FOLATE, FERRITIN, TIBC, IRON, RETICCTPCT in the last 72 hours. ?Sepsis Labs: ?No results for input(s): PROCALCITON, LATICACIDVEN in the last 168 hours. ? ?Recent Results (from the past 240 hour(s))  ?Resp Panel by  RT-PCR (Flu A&B, Covid) Nasopharyngeal Swab     Status: None  ? Collection Time: 11/02/21 11:29 PM  ? Specimen: Nasopharyngeal Swab; Nasopharyngeal(NP) swabs in vial transport medium  ?Result Value Ref Range Status  ? SARS Coronavirus 2 by RT PCR NEGATIVE NEGATIVE Final  ?  Comment: (NOTE) ?SARS-CoV-2 target nucleic acids are NOT DETECTED. ? ?The SARS-CoV-2 RNA is generally detectable in upper respiratory ?specimens during the acute phase of infection. The lowest ?concentration of SARS-CoV-2 viral copies this assay can detect is ?138 copies/mL. A negative result does not preclude SARS-Cov-2 ?infection and should not be used as the sole basis for treatment or ?other patient management decisions. A negative result may occur with  ?improper specimen collection/handling, submission of specimen other ?than nasopharyngeal swab, presence of viral mutation(s) within the ?areas targeted by this assay, and inadequate number of viral ?copies(<138 copies/mL). A negative result must be combined with ?clinical observations, patient history, and epidemiological ?information. The expected result is Negative. ? ?Fact Sheet for Patients:  ?BloggerCourse.com ? ?Fact Sheet for Healthcare Providers:  ?SeriousBroker.it ? ?This test is no t yet approved or cleared by the [place] FDA and  ?has been authorized for detection and/or diagnosis of SARS-CoV-2 by ?FDA under an Emergency Use Authorization (EUA). This EUA will remain  ?in effect (meaning this test can be used) for  the duration of the ?COVID-19 declaration under Section 564(b)(1) of the Act, 21 ?U.S.C.section 360bbb-3(b)(1), unless the authorization is terminated  ?or revoked sooner.  ? ? ?  ? Influenza A by PCR NEGATIVE NEGATIVE Final  ? Influenza B by PCR NEGATIVE NEGATIVE Final  ?  Comment: (NOTE) ?The Xpert Xpress SARS-CoV-2/FLU/RSV plus assay is intended as an aid ?in the diagnosis of influenza from Nasopharyngeal swab  specimens and ?should not be used as a sole basis for treatment. Nasal washings and ?aspirates are unacceptable for Xpert Xpress SARS-CoV-2/FLU/RSV ?testing. ? ?Fact Sheet for Patients: ?https://www.fda.go

## 2021-10-30 NOTE — Progress Notes (Signed)
? ? ? ?PULMONOLOGY ? ? ? ? ? ? ? ? ?Date: 10/30/2021,   ?MRN# [id number] Jaidah Iwen [birth year] ? ? ?  ?AdmissionWeight: 81.2 kg                 ?CurrentWeight: 81.2 kg ? ?Referring provider: Dr Dwyane Dee ? ? ?CHIEF COMPLAINT:  ? ?Acute COPD exacerbation ? ? ?HISTORY OF PRESENT ILLNESS  ? ?Patient with hx of COPD who was a life long smoker who quit smoking few months ago after last hospitalization for acute COPD exacerbation . She has CHF and CAD with HTN. She is accompanied by husband to day who helps with details of history.  She was noted by family to be acutely short of breath with acute hypoxemia and she noted no swelling acutely and had no chest pain. She uses O2 for chronic hypoxemia and is on 3-4L/min. Despite this she was still very weak and could not ventilate.  ? ?10/30/21- patient is + for Parainfluenza and is on 4L/min.  She had desturation with ambulation today to <80%.  She is making adequate urine and is feeling slowly improved. She is on torsemide /aldactone now and we can continue this for 2 more days. Repeat CXR ordered for AM.  Steroids transitioned to PO and rocephin stopped since we now know this is a viral pneumonia.  ? ? ?PAST MEDICAL HISTORY  ? ?Past Medical History:  ?Diagnosis Date  ? CHF (congestive heart failure) ([place])   ? COPD (chronic obstructive pulmonary disease) ([place])   ? Coronary artery disease   ? Hypertension   ? ? ? ?SURGICAL HISTORY  ? ?Past Surgical History:  ?Procedure Laterality Date  ? ABDOMINAL HYSTERECTOMY    ? ? ? ?FAMILY HISTORY  ? ?Family History  ?Problem Relation Age of Onset  ? Hypertension Mother   ? Heart disease Mother   ? Stomach cancer Father   ? Lupus Sister   ? ? ? ?SOCIAL HISTORY  ? ?Social History  ? ?Tobacco Use  ? Smoking status: Former  ?  Packs/day: 0.75  ?  Years: 44.00  ?  Pack years: 33.00  ?  Types: Cigarettes  ? Smokeless tobacco: Never  ?Vaping Use  ? Vaping Use: Never used  ?Substance Use Topics  ? Alcohol use: Never  ? Drug use: Never   ? ? ? ?MEDICATIONS  ? ? ?Home Medication:  ?  ?Current Medication: ? ?Current Facility-Administered Medications:  ?  acetaminophen (TYLENOL) tablet 650 mg, 650 mg, Oral, Q6H PRN **OR** acetaminophen (TYLENOL) suppository 650 mg, 650 mg, Rectal, Q6H PRN, Mansy, Jan A, MD ?  aspirin EC tablet 81 mg, 81 mg, Oral, Daily, Mansy, Jan A, MD, 81 mg at 10/30/21 0841 ?  cefTRIAXone (ROCEPHIN) 1 g in sodium chloride 0.9 % 100 mL IVPB, 1 g, Intravenous, Q24H, Mansy, Jan A, MD, Last Rate: 200 mL/hr at 10/29/21 2337, 1 g at 10/29/21 2337 ?  chlorpheniramine-HYDROcodone 10-8 MG/5ML suspension 5 mL, 5 mL, Oral, Q12H PRN, Mansy, Jan A, MD ?  empagliflozin (JARDIANCE) tablet 10 mg, 10 mg, Oral, QAC breakfast, Mansy, Jan A, MD, 10 mg at 10/29/21 [id number] ?  enoxaparin (LOVENOX) injection 40 mg, 40 mg, Subcutaneous, Q24H, Mansy, Jan A, MD, 40 mg at 10/29/21 2337 ?  guaiFENesin (MUCINEX) 12 hr tablet 600 mg, 600 mg, Oral, BID, Mansy, Jan A, MD, 600 mg at 10/30/21 0841 ?  hydrochlorothiazide (HYDRODIURIL) tablet 12.5 mg, 12.5 mg, Oral, Daily, Mansy, Jan A, MD, 12.5 mg at 10/30/21 0841 ?  ipratropium-albuterol (DUONEB) 0.5-2.5 (3) MG/3ML nebulizer solution 3 mL, 3 mL, Nebulization, QID, Mansy, Jan A, MD, 3 mL at 10/30/21 0732 ?  magnesium hydroxide (MILK OF MAGNESIA) suspension 30 mL, 30 mL, Oral, Daily PRN, Mansy, Jan A, MD, 30 mL at 10/29/21 2102 ?  multivitamin with minerals tablet 1 tablet, 1 tablet, Oral, Daily, Mansy, Jan A, MD, 1 tablet at 10/30/21 0841 ?  ondansetron (ZOFRAN) tablet 4 mg, 4 mg, Oral, Q6H PRN **OR** ondansetron (ZOFRAN) injection 4 mg, 4 mg, Intravenous, Q6H PRN, Mansy, Jan A, MD, 4 mg at 10/29/21 0024 ?  [COMPLETED] methylPREDNISolone sodium succinate (SOLU-MEDROL) 40 mg/mL injection 40 mg, 40 mg, Intravenous, Q12H, 40 mg at 10/29/21 1214 **FOLLOWED BY** predniSONE (DELTASONE) tablet 40 mg, 40 mg, Oral, Q breakfast, Mansy, Jan A, MD, 40 mg at 10/30/21 0841 ?  spironolactone (ALDACTONE) tablet 25 mg, 25 mg, Oral,  Daily, Ottie Glazier, MD, 25 mg at 10/30/21 0841 ?  tiotropium (SPIRIVA) inhalation capsule ([facility] use ONLY) 18 mcg, 18 mcg, Inhalation, Daily, Mansy, Jan A, MD ?  torsemide (DEMADEX) tablet 20 mg, 20 mg, Oral, Daily, Chanice Brenton, MD, 20 mg at 10/30/21 0841 ?  traZODone (DESYREL) tablet 25 mg, 25 mg, Oral, QHS PRN, Mansy, Arvella Merles, MD ? ? ? ?ALLERGIES  ? ?Patient has no known allergies. ? ? ? ? ?REVIEW OF SYSTEMS  ? ? ?Review of Systems: ? ?Gen:  Denies  fever, sweats, chills weigh loss  ?HEENT: Denies blurred vision, double vision, ear pain, eye pain, hearing loss, nose bleeds, sore throat ?Cardiac:  No dizziness, chest pain or heaviness, chest tightness,edema ?Resp:   reports dyspnea chronically  ?Gi: Denies swallowing difficulty, stomach pain, nausea or vomiting, diarrhea, constipation, bowel incontinence ?Gu:  Denies bladder incontinence, burning urine ?Ext:   Denies Joint pain, stiffness or swelling ?Skin: Denies  skin rash, easy bruising or bleeding or hives ?Endoc:  Denies polyuria, polydipsia , polyphagia or weight change ?Psych:   Denies depression, insomnia or hallucinations  ? ?Other:  All other systems negative ? ? ?VS: BP 103/74 (BP Location: Right Arm)   Pulse 89   Temp 98.1 ?F (36.7 ?C) (Oral)   Resp 18   Ht 5\' 7"  (1.702 m)   Wt 81.2 kg   SpO2 92%   BMI 28.04 kg/m?   ? ? ? ?PHYSICAL EXAM  ? ? ?GENERAL:NAD, no fevers, chills, no weakness no fatigue ?HEAD: Normocephalic, atraumatic.  ?EYES: Pupils equal, round, reactive to light. Extraocular muscles intact. No scleral icterus.  ?MOUTH: Moist mucosal membrane. Dentition intact. No abscess noted.  ?EAR, NOSE, THROAT: Clear without exudates. No external lesions.  ?NECK: Supple. No thyromegaly. No nodules. No JVD.  ?PULMONARY: decreased breath sounds with mild rhonchi worse at bases bilaterally.  ?CARDIOVASCULAR: S1 and S2. Regular rate and rhythm. No murmurs, rubs, or gallops. No edema. Pedal pulses 2+ bilaterally.  ?GASTROINTESTINAL: Soft,  nontender, nondistended. No masses. Positive bowel sounds. No hepatosplenomegaly.  ?MUSCULOSKELETAL: No swelling, clubbing, or edema. Range of motion full in all extremities.  ?NEUROLOGIC: Cranial nerves II through XII are intact. No gross focal neurological deficits. Sensation intact. Reflexes intact.  ?SKIN: No ulceration, lesions, rashes, or cyanosis. Skin warm and dry. Turgor intact.  ?PSYCHIATRIC: Mood, affect within normal limits. The patient is awake, alert and oriented x 3. Insight, judgment intact.  ? ? ?  ? ?IMAGING  ? ? ? ?ASSESSMENT/PLAN  ? ?Acute on chronic hypoxemic respiratory failure ?   Due to pneumonia worse on right with mild  pleural effusion.  ?   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   - she had mild elevation of BNP ?    - repeat TTE ?    RVP + for parainfluenza 3 ? ?Moderate COPD exacerbation  ?    Agree with current antimicrobials and steroids via prednisone ? ? ? Bilateral pleural effusions ? I suspect this is CHF related . Will diurese for now and re-evaluate CXR ?-diuresed well overnight ? ? ?Bibasilar atelectasis ?   - CXR post Metaneb with albuterol   ?    PT/OT  ?    Incentive spirometry  ? ? ? ? ? ? ? ?Thank you for allowing me to participate in the care of this patient.  ? ?Patient/Family are satisfied with care plan and all questions have been answered.  ? ? ?Provider disclosure: ?Patient with at least one acute or chronic illness or injury that poses a threat to life or bodily function and is being managed actively during this encounter.  All of the below services have been performed independently by signing provider:  review of prior documentation from internal and or external health records.  Review of previous and current lab results.  Interview and comprehensive assessment during patient visit today. Review of current and previous chest radiographs/CT scans. Discussion of management and test interpretation with health care team and patient/family.  ? ?This document  was prepared using Dragon voice recognition software and may include unintentional dictation errors. ? ? ?  ?Ottie Glazier, M.D.  ?Division of Pulmonary & Critical Care Medicine  ? ? ? ? ? ? ? ? ?

## 2021-10-30 NOTE — Progress Notes (Signed)
Assumed care of pt at 1900. A&O x4. Requiring 3 L [employer] this shift, dsatting 88-89% upon ambulation. Pt respiratory panel resulting positive w/ parainfluenza virus 3, provider made aware and contact precautions initiated per provider. Pt educated on POC w/ verbalized understanding. Medication administration per MAR. Full assessment per flowsheets. Call bell within reach, making needs known. ? ?At approx 0445, HR up to 140s sustaining. EKG obtained revealing afib RVR. Pt sounding more coarse throughout lungs at this time, IVF stopped. Provider made aware. One time dose IV lasix given per MAR. Awaiting effect. Per provider, no need for IV lopressor at this time, will wait for Lasix to have effect. Purewick in place and pt aware of bedrest until HR under control. ?

## 2021-10-31 ENCOUNTER — Inpatient Hospital Stay
Admit: 2021-10-31 | Discharge: 2021-10-31 | Disposition: A | Discharge status: Home / Self Care | Payer: Self-pay | Attending: Medical | Admitting: Medical

## 2021-10-31 ENCOUNTER — Encounter: Payer: Self-pay | Admitting: Family Medicine

## 2021-10-31 DIAGNOSIS — I480 Paroxysmal atrial fibrillation: Secondary | ICD-10-CM

## 2021-10-31 LAB — BLOOD GAS, ARTERIAL
Acid-Base Excess: 37.7 mmol/L — ABNORMAL HIGH (ref 0.0–2.0)
Bicarbonate: 67.6 mmol/L — ABNORMAL HIGH (ref 20.0–28.0)
O2 Content: 3.5 L/min
O2 Saturation: 94.7 %
Patient temperature: 37
pCO2 arterial: 79 mmHg (ref 32–48)
pH, Arterial: 7.54 — ABNORMAL HIGH (ref 7.35–7.45)
pO2, Arterial: 69 mmHg — ABNORMAL LOW (ref 83–108)

## 2021-10-31 LAB — TSH: TSH: 1.286 u[IU]/mL (ref 0.350–4.500)

## 2021-10-31 LAB — MAGNESIUM: Magnesium: 2.6 mg/dL — ABNORMAL HIGH (ref 1.7–2.4)

## 2021-10-31 MED ORDER — DILTIAZEM HCL ER COATED BEADS 120 MG PO CP24
120.0000 mg | ORAL_CAPSULE | Freq: Every day | ORAL | Status: DC
  Administered 2021-10-31: 120 mg via ORAL
  Filled 2021-10-31: qty 1

## 2021-10-31 MED ORDER — ALBUTEROL SULFATE (2.5 MG/3ML) 0.083% IN NEBU
2.5000 mg | INHALATION_SOLUTION | Freq: Every day | RESPIRATORY_TRACT | Status: DC
Start: 2021-11-01 — End: 2021-11-03
  Administered 2021-11-01 – 2021-11-03 (×3): 2.5 mg via RESPIRATORY_TRACT
  Filled 2021-10-31 (×3): qty 3

## 2021-10-31 MED ORDER — AMIODARONE HCL IN DEXTROSE 360-4.14 MG/200ML-% IV SOLN
30.0000 mg/h | INTRAVENOUS | Status: DC
  Administered 2021-10-31 – 2021-11-03 (×7): 30 mg/h via INTRAVENOUS
  Filled 2021-10-31 (×6): qty 200

## 2021-10-31 MED ORDER — METOPROLOL TARTRATE 5 MG/5ML IV SOLN
5.0000 mg | INTRAVENOUS | Status: DC | PRN
  Administered 2021-10-31 – 2021-11-07 (×3): 5 mg via INTRAVENOUS
  Filled 2021-10-31 (×3): qty 5

## 2021-10-31 MED ORDER — HALOPERIDOL LACTATE 5 MG/ML IJ SOLN
2.5000 mg | Freq: Once | INTRAMUSCULAR | Status: AC
  Administered 2021-10-31: 2.5 mg via INTRAVENOUS
  Filled 2021-10-31: qty 1

## 2021-10-31 MED ORDER — AMIODARONE LOAD VIA INFUSION
150.0000 mg | Freq: Once | INTRAVENOUS | Status: AC
  Administered 2021-10-31: 150 mg via INTRAVENOUS
  Filled 2021-10-31: qty 83.34

## 2021-10-31 MED ORDER — AMIODARONE HCL IN DEXTROSE 360-4.14 MG/200ML-% IV SOLN
60.0000 mg/h | INTRAVENOUS | Status: AC
  Administered 2021-10-31 (×2): 60 mg/h via INTRAVENOUS
  Filled 2021-10-31 (×2): qty 200

## 2021-10-31 MED ORDER — DILTIAZEM HCL 30 MG PO TABS: 30.0000 mg | ORAL_TABLET | Freq: Three times a day (TID) | ORAL | Status: DC | PRN

## 2021-10-31 MED ORDER — APIXABAN 5 MG PO TABS
5.0000 mg | ORAL_TABLET | Freq: Two times a day (BID) | ORAL | Status: DC
  Administered 2021-10-31 – 2021-11-04 (×9): 5 mg via ORAL
  Filled 2021-10-31 (×9): qty 1

## 2021-10-31 NOTE — Progress Notes (Signed)
?PROGRESS NOTE ? ? ? ?Virginia Norman  NWG:[id number] DOB: 1961/05/26 DOA: 10/30/2021 ? ?PCP: [facility]  ? ? ?Brief Narrative:  ?This 61 years old female with PMH significant for CHF, COPD, coronary artery disease, hypertension presented to the ED with complaints of severe shortness of breath.  She was hypoxic SPO2 69% on room air, her oxygen fell off at night.  Patient also reports productive cough, she was found to have wheezing on exam.  ABG shows pH 7.2 with PCO2 of 98%.  Patient was started on BiPAP.  Patient was admitted for acute on chronic hypoxic and hypercarbic respiratory failure secondary to CO2 retention. Patient was started on steroids and nebulized bronchodilators.  Patient has been doing better, pulmonology consulted recommended to continue diuretics.  RVP positive for parainfluenza virus.  Antibiotic discontinued. ? ?Assessment & Plan: ?  ?Principal Problem: ?  Acute respiratory failure with hypoxia and hypercapnia (HCC) ?Active Problems: ?  COPD exacerbation (HCC) ?  Benign essential HTN ?  Chronic diastolic CHF (congestive heart failure) (HCC) ? ?Acute on chronic hypoxic and hypercarbic respiratory failure: ?Patient presented with SPO2 69% on room air requiring BiPAP on arrival. ?ABG shows pH 7.2 with PCO2 of 98%. ?Continue BiPAP as needed and at night. ?Continue IV Solu-Medrol and nebulized bronchodilators. ?Continue supplemental oxygen and wean as tolerated. ?Could be multifactorial COPD and CHF exacerbation. ?BNP not significantly elevated. ?Pulmonology consulted recommended to continue diuretic therapy. ?RVP positive for parainfluenza.  Antibiotics discontinued. ?She is overall doing much better. ? ?COPD exacerbation: > Improving. ?Continue nebulized bronchodilators with DuoNeb. ?Continue with steroid therapy with IV Solu-Medrol. ?Patient was empirically started on IV antibiotics. ?RVP shows parainfluenza virus , antibiotic discontinued ?Continue mucolytic  therapy. ? ?Bilateral pleural effusion: ?Likely related to CHF.  Continue IV diuresis. ?Repeat chest x-ray in the morning improved findings.   ?Obtain 2D echo showed LVEF 50 to 55%. ? ?Continue diastolic CHF: ?BNP not significantly elevated. ?Continue torsemide, Aldactone. ?Renal functions are stable.  HCTZ discontinued. ? ?Paroxysmal atrial fibrillation: ?Likely in the setting of bronchodilators, parainfluenza / COPD. ?Spontaneously converted to normal sinus rhythm without intervention. ?Continue Cardizem ER 120 mg daily, start Eliquis 5 mg twice daily. ?Recommend diltiazem 30 mg 3 times daily as needed for paroxysmal A-fib episode. ?Recommended Zio patch monitoring at discharge. ? ?Essential hypertension: ?Continue home blood pressure medication ? ?DVT prophylaxis: Lovenox ?Code Status: Full code ?Family Communication: No family at bedside ?Disposition Plan: ? ?Status is: Inpatient ?Remains inpatient appropriate because: Admitted for acute on chronic hypoxic and hypercarbic respiratory failure secondary to possible COPD and CHF exacerbation requiring IV diuresis IV Solu-Medrol and antibiotics,developed paroxysmal A-fib requiring Cardizem. ?  ? ?Consultants:  ?Pulmonology ?Cardiology ? ?Procedures: Chest x-ray ? ?Antimicrobials: ?Anti-infectives (From admission, onward)  ? ? Start     Dose/Rate Route Frequency Ordered Stop  ? 10/25/2021 2345  cefTRIAXone (ROCEPHIN) 1 g in sodium chloride 0.9 % 100 mL IVPB  Status:  Discontinued       ? 1 g ?200 mL/hr over 30 Minutes Intravenous Every 24 hours 10/21/2021 2338 10/30/21 1512  ? ?  ?  ? ?Subjective: ?Patient was seen and examined at bedside.  Overnight events noted.   ?Patient reports feeling much improved.  Overnight she has episodes where her heart rate went up to 160s to 180s. ?Patient reports having fluttering symptoms but denies any symptoms at this time. ? ?Objective: ?Vitals:  ? 10/30/21 2310 10/31/21 0309 10/31/21 0932 10/31/21 1118  ?BP: 131/75 116/85 112/84  133/89  ?Pulse: 87 90 92 97  ?Resp: 16 18 14 19   ?Temp: 98.1 ?F (36.7 ?C) 98.1 ?F (36.7 ?C) 97.8 ?F (36.6 ?C) 98.4 ?F (36.9 ?C)  ?TempSrc: Oral Oral Oral Oral  ?SpO2: 98% 93% 92% 90%  ?Weight:      ?Height:      ? ? ?Intake/Output Summary (Last 24 hours) at 10/31/2021 1320 ?Last data filed at 10/31/2021 1100 ?Gross per 24 hour  ?Intake 960 ml  ?Output 2450 ml  ?Net -1490 ml  ? ?Filed Weights  ? 10/30/2021 1845  ?Weight: 81.2 kg  ? ? ?Examination: ? ?General exam: Appears comfortable, not in any acute distress.  Deconditioned ?Respiratory system: CTA bilaterally, no wheezing, no crackles, normal respiratory effort. ?Cardiovascular system: S1-S2 heard, regular rate and rhythm, no murmur. ?Gastrointestinal system: Abdomen is soft, non tender, non distended, BS+ ?Central nervous system: Alert and oriented x 3. No focal neurological deficits. ?Extremities: No edema, no cyanosis, no clubbing. ?Skin: No rashes, lesions or ulcers ?Psychiatry: Judgement and insight appear normal. Mood & affect appropriate.  ? ? ? ?Data Reviewed: I have personally reviewed following labs and imaging studies ? ?CBC: ?Recent Labs  ?Lab 10/27/2021 ?1850 10/29/21 ?0552  ?WBC 10.9* 8.3  ?NEUTROABS 9.4*  --   ?HGB 16.4* 15.6*  ?HCT 53.5* 51.3*  ?MCV 94.5 95.4  ?PLT 233 207  ? ?Basic Metabolic Panel: ?Recent Labs  ?Lab 10/07/2021 ?1850 10/29/21 ?0552  ?NA 139 137  ?K 5.0 4.6  ?CL 93* 92*  ?CO2 36* 36*  ?GLUCOSE 121* 137*  ?BUN 25* 31*  ?CREATININE 0.65 0.52  ?CALCIUM 9.0 8.8*  ? ?GFR: ?Estimated Creatinine Clearance: 79.9 mL/min (by C-G formula based on SCr of 0.52 mg/dL). ?Liver Function Tests: ?No results for input(s): AST, ALT, ALKPHOS, BILITOT, PROT, ALBUMIN in the last 168 hours. ?No results for input(s): LIPASE, AMYLASE in the last 168 hours. ?No results for input(s): AMMONIA in the last 168 hours. ?Coagulation Profile: ?No results for input(s): INR, PROTIME in the last 168 hours. ?Cardiac Enzymes: ?No results for input(s): CKTOTAL, CKMB, CKMBINDEX,  TROPONINI in the last 168 hours. ?BNP (last 3 results) ?No results for input(s): PROBNP in the last 8760 hours. ?HbA1C: ?No results for input(s): HGBA1C in the last 72 hours. ?CBG: ?No results for input(s): GLUCAP in the last 168 hours. ?Lipid Profile: ?No results for input(s): CHOL, HDL, LDLCALC, TRIG, CHOLHDL, LDLDIRECT in the last 72 hours. ?Thyroid Function Tests: ?No results for input(s): TSH, T4TOTAL, FREET4, T3FREE, THYROIDAB in the last 72 hours. ?Anemia Panel: ?No results for input(s): VITAMINB12, FOLATE, FERRITIN, TIBC, IRON, RETICCTPCT in the last 72 hours. ?Sepsis Labs: ?No results for input(s): PROCALCITON, LATICACIDVEN in the last 168 hours. ? ?Recent Results (from the past 240 hour(s))  ?Resp Panel by RT-PCR (Flu A&B, Covid) Nasopharyngeal Swab     Status: None  ? Collection Time: 10/12/2021 11:29 PM  ? Specimen: Nasopharyngeal Swab; Nasopharyngeal(NP) swabs in vial transport medium  ?Result Value Ref Range Status  ? SARS Coronavirus 2 by RT PCR NEGATIVE NEGATIVE Final  ?  Comment: (NOTE) ?SARS-CoV-2 target nucleic acids are NOT DETECTED. ? ?The SARS-CoV-2 RNA is generally detectable in upper respiratory ?specimens during the acute phase of infection. The lowest ?concentration of SARS-CoV-2 viral copies this assay can detect is ?138 copies/mL. A negative result does not preclude SARS-Cov-2 ?infection and should not be used as the sole basis for treatment or ?other patient management decisions. A negative result may occur with  ?improper specimen collection/handling, submission  of specimen other ?than nasopharyngeal swab, presence of viral mutation(s) within the ?areas targeted by this assay, and inadequate number of viral ?copies(<138 copies/mL). A negative result must be combined with ?clinical observations, patient history, and epidemiological ?information. The expected result is Negative. ? ?Fact Sheet for Patients:  ?BloggerCourse.com ? ?Fact Sheet for Healthcare Providers:   ?SeriousBroker.it ? ?This test is no t yet approved or cleared by the [place] FDA and  ?has been authorized for detection and/or diagnosis of SARS-CoV-2 by ?FDA under an Emergency Use Authoriza

## 2021-10-31 NOTE — Consult Note (Signed)
?Cardiology Consultation:  ? ?Patient ID: Virginia Norman ?MRN: [id number]; DOB: 04-27-1961 ? ?Admit date: 10/25/2021 ?Date of Consult: 10/31/2021 ? ?PCP:  [facility] ?  ?[facility] ?Cardiologist:  New ? ?Patient Profile:  ? ?Virginia Norman is a 61 y.o. female with a hx of CHF, COPD, CAD, HTN who is being seen 10/31/2021 for the evaluation of CHF at the request of Dr. Dwyane Dee. ? ?History of Present Illness:  ? ?[name] has remotely seen [facility] cardiology as well as [facility]. More recently has been seen by [facility].  ? ?NO history of MI or stent. Family history positive for afib in mother. She is a former smoker with COPD on 4L O2 at baseline. No alcohol or drug use.  ? ?ER visit in 10/205 for chest pain with negative enzymes. Seen by [facility] in follow-up. Echo showed normal LVSF, normal RVSF, no valvular stenosis, mild MR and mild TR. Myoview Lexiscan was normal.  ? ?Seen by [facility] cardiology 05/2018 and was doing well.  ? ?She more recently started following with Darylene Price after admission 07/2021 for COPD exacerbation. Baseline weight reported 172-176.  ? ?The patient presented 4/26 for SOB. Also reported cough with clear sputum and wheezing. No chest pain, LLE,  ? ?In the ER she was hypoxic and required Bipap. RR 33, otherwise normal vital signs. HS trop 18>19. BNP 129. CBC showed WBC 10.9, Chl 93, CO2 36, Scr 0.65, BUN 25. CXR with possible PNA and small bilateral pleural effusions.Found to be parainfluenza+. She was given duoneb, prednisone and admitted for further work-up.  ? ?Heart rates noted to be elevated after nebulizer treatment. EKG shows Afib RVR with heart rates in the 140s. Now in NSR.  ? ? ?Past Medical History:  ?Diagnosis Date  ? CHF (congestive heart failure) ([place])   ? COPD (chronic obstructive pulmonary disease) ([place])   ? Coronary artery disease   ? Hypertension   ? ? ?Past Surgical History:  ?Procedure Laterality Date  ? ABDOMINAL HYSTERECTOMY    ?  ? ?Home Medications:   ?Prior to Admission medications   ?Medication Sig Start Date End Date Taking? Authorizing Provider  ?albuterol (VENTOLIN HFA) 108 (90 Base) MCG/ACT inhaler Inhale 1-2 puffs into the lungs every 6 (six) hours as needed for wheezing or shortness of breath. 09/01/21  Yes Darylene Price A, FNP  ?aspirin EC 81 MG tablet Take 81 mg by mouth daily.   Yes [provider]  ?empagliflozin (JARDIANCE) 10 MG TABS tablet Take 1 tablet (10 mg total) by mouth daily before breakfast. 07/30/21  Yes Alisa Graff, FNP  ?mometasone-formoterol (DULERA) 100-5 MCG/ACT AERO Inhale 2 puffs into the lungs in the morning and at bedtime. 07/22/21  Yes Dessa Phi, DO  ?Multiple Vitamins-Minerals (MULTIVITAMIN WITH MINERALS) tablet Take 1 tablet by mouth daily.   Yes [provider]  ?potassium chloride SA (KLOR-CON M) 20 MEQ tablet Take 1 tablet (20 mEq total) by mouth daily. 09/02/21  Yes Alisa Graff, FNP  ?tiotropium (SPIRIVA) 18 MCG inhalation capsule Place 18 mcg into inhaler and inhale daily.   Yes [provider]  ?hydrochlorothiazide (HYDRODIURIL) 12.5 MG tablet Take 1 tablet (12.5 mg total) by mouth daily. ?Patient not taking: Reported on 10/29/2021 07/22/21   Dessa Phi, DO  ?hydrochlorothiazide (MICROZIDE) 12.5 MG capsule Take 12.5 mg by mouth daily. ?Patient not taking: Reported on 10/29/2021 07/22/21   [provider]  ? ? ?Inpatient Medications: ?Scheduled Meds: ? aspirin EC  81 mg  Oral Daily  ? empagliflozin  10 mg Oral QAC breakfast  ? enoxaparin (LOVENOX) injection  40 mg Subcutaneous Q24H  ? guaiFENesin  600 mg Oral BID  ? multivitamin with minerals  1 tablet Oral Daily  ? predniSONE  40 mg Oral Q breakfast  ? tiotropium  18 mcg Inhalation Daily  ? ?Continuous Infusions: ? ?PRN Meds: ?acetaminophen **OR** acetaminophen, chlorpheniramine-HYDROcodone, magnesium hydroxide, ondansetron **OR** ondansetron (ZOFRAN) IV, traZODone ? ?Allergies:   No Known Allergies ? ?Social History:   ?Social  History  ? ?Socioeconomic History  ? Marital status: Single  ?  Spouse name: Not on file  ? Number of children: Not on file  ? Years of education: Not on file  ? Highest education level: Not on file  ?Occupational History  ? Not on file  ?Tobacco Use  ? Smoking status: Former  ?  Packs/day: 0.75  ?  Years: 44.00  ?  Pack years: 33.00  ?  Types: Cigarettes  ? Smokeless tobacco: Never  ?Vaping Use  ? Vaping Use: Never used  ?Substance and Sexual Activity  ? Alcohol use: Never  ? Drug use: Never  ? Sexual activity: Not on file  ?Other Topics Concern  ? Not on file  ?Social History Narrative  ? Not on file  ? ?Social Determinants of Health  ? ?Financial Resource Strain: Not on file  ?Food Insecurity: Not on file  ?Transportation Needs: Not on file  ?Physical Activity: Not on file  ?Stress: Not on file  ?Social Connections: Not on file  ?Intimate Partner Violence: Not on file  ?  ?Family History:   ? ?Family History  ?Problem Relation Age of Onset  ? Arrhythmia Mother   ? Hypertension Mother   ? Heart disease Mother   ? Stomach cancer Father   ? Lupus Sister   ?  ? ?ROS:  ?Please see the history of present illness.  ? ?All other ROS reviewed and negative.    ? ?Physical Exam/Data:  ? ?Vitals:  ? 10/30/21 2310 10/31/21 0309 10/31/21 0932 10/31/21 1118  ?BP: 131/75 116/85 112/84 133/89  ?Pulse: 87 90 92 97  ?Resp: 16 18 14 19   ?Temp: 98.1 ?F (36.7 ?C) 98.1 ?F (36.7 ?C) 97.8 ?F (36.6 ?C) 98.4 ?F (36.9 ?C)  ?TempSrc: Oral Oral Oral   ?SpO2: 98% 93% 92% 90%  ?Weight:      ?Height:      ? ? ?Intake/Output Summary (Last 24 hours) at 10/31/2021 1133 ?Last data filed at 10/30/2021 2130 ?Gross per 24 hour  ?Intake 960 ml  ?Output 1500 ml  ?Net -540 ml  ? ? ?  10/08/2021  ?  6:45 PM 09/01/2021  ?  2:59 PM 07/30/2021  ?  1:52 PM  ?Last 3 Weights  ?Weight (lbs) 179 lb 182 lb 1 oz 177 lb 6 oz  ?Weight (kg) 81.194 kg 82.583 kg 80.457 kg  ?   ?Body mass index is 28.04 kg/m?.  ?General:  Well nourished, well developed, in no acute  distress ?HEENT: normal ?Neck: no JVD ?Vascular: No carotid bruits; Distal pulses 2+ bilaterally ?Cardiac:  normal S1, S2; RRR; no murmur  ?Lungs:diffuse rhonchi  ?Abd: soft, nontender, no hepatomegaly  ?Ext: no edema ?Musculoskeletal:  No deformities, BUE and BLE strength normal and equal ?Skin: warm and dry  ?Neuro:  CNs 2-12 intact, no focal abnormalities noted ?Psych:  Normal affect  ? ?EKG:  The EKG was personally reviewed and demonstrates:  EKG  Afib 140 bpm, likely  rate related changes ?Telemetry:  Telemetry was personally reviewed and demonstrates:  NSR, brief run of SVT ? ?Relevant CV Studies: ? ?Echo ordered ? ?Echo Jul 31, 2021 ?1. Left ventricular ejection fraction, by estimation, is 65 to 70%. The  ?left ventricle has normal function. The left ventricle has no regional  ?wall motion abnormalities. There is mild left ventricular hypertrophy.  ?Left ventricular diastolic parameters  ?are consistent with Grade II diastolic dysfunction (pseudonormalization).  ? 2. Right ventricular systolic function is normal. The right ventricular  ?size is normal.  ? 3. Left atrial size was severely dilated.  ? 4. The mitral valve is grossly normal. Mild mitral valve regurgitation.  ?Mild mitral stenosis.  ? 5. The aortic valve is calcified. Aortic valve regurgitation is not  ?visualized. Aortic valve sclerosis/calcification is present, without any  ?evidence of aortic stenosis.  ? 6. The inferior vena cava is normal in size with <50% respiratory  ?variability, suggesting right atrial pressure of 8 mmHg.  ? ?Laboratory Data: ? ?High Sensitivity Troponin:   ?Recent Labs  ?Lab 10/27/2021 ?1904 10/26/2021 ?2118  ?TROPONINIHS 18* 19*  ?   ?Chemistry ?Recent Labs  ?Lab 10/12/2021 ?1850 10/29/21 ?[id number]  ?NA 139 137  ?K 5.0 4.6  ?CL 93* 92*  ?CO2 36* 36*  ?GLUCOSE 121* 137*  ?BUN 25* 31*  ?CREATININE 0.65 0.52  ?CALCIUM 9.0 8.8*  ?GFRNONAA >60 >60  ?ANIONGAP 10 9  ?  ?No results for input(s): PROT, ALBUMIN, AST, ALT, ALKPHOS, BILITOT in the  last 168 hours. ?Lipids No results for input(s): CHOL, TRIG, HDL, LABVLDL, LDLCALC, CHOLHDL in the last 168 hours.  ?Hematology ?Recent Labs  ?Lab 10/31/2021 ?1850 10/29/21 ?[id number]  ?WBC 10.9* 8.3  ?RBC 5.66* 5.38*  ?

## 2021-10-31 NOTE — Progress Notes (Signed)
1400 BP 79/56 after lopressor. Notified PA  ?1430 Patient still in Afib RVR PA notified. Ordered Amio drip and bolus.  ?1510 Amio bolus given. Patient converted back to NSR in the 80s after bolus.  ?

## 2021-10-31 NOTE — Progress Notes (Signed)
Patient in Afib RVR up to 160. PA notified. EKG completed. Order 5mg  lopressor. Will continue to monitor.  ? , RN  ?

## 2021-10-31 NOTE — Progress Notes (Signed)
? ? ? ?PULMONOLOGY ? ? ? ? ? ? ? ? ?Date: 10/31/2021,   ?MRN# [id number] Michelena Sibayan [birth year] ? ? ?  ?AdmissionWeight: 81.2 kg                 ?CurrentWeight: 81.2 kg ? ?Referring provider: Dr Dwyane Dee ? ? ?CHIEF COMPLAINT:  ? ?Acute COPD exacerbation ? ? ?HISTORY OF PRESENT ILLNESS  ? ?Patient with hx of COPD who was a life long smoker who quit smoking few months ago after last hospitalization for acute COPD exacerbation . She has CHF and CAD with HTN. She is accompanied by husband to day who helps with details of history.  She was noted by family to be acutely short of breath with acute hypoxemia and she noted no swelling acutely and had no chest pain. She uses O2 for chronic hypoxemia and is on 3-4L/min. Despite this she was still very weak and could not ventilate.  ? ?10/31/21- patietn respiratory status is improved but she had AF episode again and cardiology consultation is ordered. I stopped nebulzier duoneb to reduce risk of AF episodes.  She diuresed well and no longer requires diuretics , I have dcd torsemide and aldactone. Patient is doing better can be Dcd home once cleared from cardio perspective ? ? ?PAST MEDICAL HISTORY  ? ?Past Medical History:  ?Diagnosis Date  ? CHF (congestive heart failure) ([place])   ? COPD (chronic obstructive pulmonary disease) ([place])   ? Coronary artery disease   ? Hypertension   ? ? ? ?SURGICAL HISTORY  ? ?Past Surgical History:  ?Procedure Laterality Date  ? ABDOMINAL HYSTERECTOMY    ? ? ? ?FAMILY HISTORY  ? ?Family History  ?Problem Relation Age of Onset  ? Hypertension Mother   ? Heart disease Mother   ? Stomach cancer Father   ? Lupus Sister   ? ? ? ?SOCIAL HISTORY  ? ?Social History  ? ?Tobacco Use  ? Smoking status: Former  ?  Packs/day: 0.75  ?  Years: 44.00  ?  Pack years: 33.00  ?  Types: Cigarettes  ? Smokeless tobacco: Never  ?Vaping Use  ? Vaping Use: Never used  ?Substance Use Topics  ? Alcohol use: Never  ? Drug use: Never  ? ? ? ?MEDICATIONS  ? ? ?Home Medication:   ?  ?Current Medication: ? ?Current Facility-Administered Medications:  ?  acetaminophen (TYLENOL) tablet 650 mg, 650 mg, Oral, Q6H PRN **OR** acetaminophen (TYLENOL) suppository 650 mg, 650 mg, Rectal, Q6H PRN, Mansy, Jan A, MD ?  aspirin EC tablet 81 mg, 81 mg, Oral, Daily, Mansy, Jan A, MD, 81 mg at 10/31/21 0853 ?  chlorpheniramine-HYDROcodone 10-8 MG/5ML suspension 5 mL, 5 mL, Oral, Q12H PRN, Mansy, Jan A, MD ?  empagliflozin (JARDIANCE) tablet 10 mg, 10 mg, Oral, QAC breakfast, Mansy, Jan A, MD, 10 mg at 10/31/21 0855 ?  enoxaparin (LOVENOX) injection 40 mg, 40 mg, Subcutaneous, Q24H, Mansy, Jan A, MD, 40 mg at 10/30/21 2316 ?  guaiFENesin (MUCINEX) 12 hr tablet 600 mg, 600 mg, Oral, BID, Mansy, Jan A, MD, 600 mg at 10/31/21 0853 ?  ipratropium-albuterol (DUONEB) 0.5-2.5 (3) MG/3ML nebulizer solution 3 mL, 3 mL, Nebulization, QID, Mansy, Jan A, MD, 3 mL at 10/31/21 0748 ?  magnesium hydroxide (MILK OF MAGNESIA) suspension 30 mL, 30 mL, Oral, Daily PRN, Mansy, Jan A, MD, 30 mL at 10/29/21 2102 ?  multivitamin with minerals tablet 1 tablet, 1 tablet, Oral, Daily, Mansy, Arvella Merles, MD, 1 tablet  at 10/31/21 0853 ?  ondansetron (ZOFRAN) tablet 4 mg, 4 mg, Oral, Q6H PRN **OR** ondansetron (ZOFRAN) injection 4 mg, 4 mg, Intravenous, Q6H PRN, Mansy, Jan A, MD, 4 mg at 10/29/21 0024 ?  [COMPLETED] methylPREDNISolone sodium succinate (SOLU-MEDROL) 40 mg/mL injection 40 mg, 40 mg, Intravenous, Q12H, 40 mg at 10/29/21 1214 **FOLLOWED BY** predniSONE (DELTASONE) tablet 40 mg, 40 mg, Oral, Q breakfast, Mansy, Jan A, MD, 40 mg at 10/31/21 [id number] ?  spironolactone (ALDACTONE) tablet 25 mg, 25 mg, Oral, Daily, Ottie Glazier, MD, 25 mg at 10/31/21 0853 ?  tiotropium (SPIRIVA) inhalation capsule ([facility] use ONLY) 18 mcg, 18 mcg, Inhalation, Daily, Mansy, Jan A, MD, 18 mcg at 10/31/21 0854 ?  torsemide (DEMADEX) tablet 20 mg, 20 mg, Oral, Daily, [place]ia Noori, MD, 20 mg at 10/31/21 0853 ?  traZODone (DESYREL) tablet 25 mg, 25 mg,  Oral, QHS PRN, Mansy, Jan A, MD, 25 mg at 10/30/21 2132 ? ? ? ?ALLERGIES  ? ?Patient has no known allergies. ? ? ? ? ?REVIEW OF SYSTEMS  ? ? ?Review of Systems: ? ?Gen:  Denies  fever, sweats, chills weigh loss  ?HEENT: Denies blurred vision, double vision, ear pain, eye pain, hearing loss, nose bleeds, sore throat ?Cardiac:  No dizziness, chest pain or heaviness, chest tightness,edema ?Resp:   reports dyspnea chronically  ?Gi: Denies swallowing difficulty, stomach pain, nausea or vomiting, diarrhea, constipation, bowel incontinence ?Gu:  Denies bladder incontinence, burning urine ?Ext:   Denies Joint pain, stiffness or swelling ?Skin: Denies  skin rash, easy bruising or bleeding or hives ?Endoc:  Denies polyuria, polydipsia , polyphagia or weight change ?Psych:   Denies depression, insomnia or hallucinations  ? ?Other:  All other systems negative ? ? ?VS: BP 112/84 (BP Location: Left Arm)   Pulse 92   Temp 97.8 ?F (36.6 ?C) (Oral)   Resp 14   Ht 5\' 7"  (1.702 m)   Wt 81.2 kg   SpO2 92%   BMI 28.04 kg/m?   ? ? ? ?PHYSICAL EXAM  ? ? ?GENERAL:NAD, no fevers, chills, no weakness no fatigue ?HEAD: Normocephalic, atraumatic.  ?EYES: Pupils equal, round, reactive to light. Extraocular muscles intact. No scleral icterus.  ?MOUTH: Moist mucosal membrane. Dentition intact. No abscess noted.  ?EAR, NOSE, THROAT: Clear without exudates. No external lesions.  ?NECK: Supple. No thyromegaly. No nodules. No JVD.  ?PULMONARY: decreased breath sounds with mild rhonchi worse at bases bilaterally.  ?CARDIOVASCULAR: S1 and S2. Regular rate and rhythm. No murmurs, rubs, or gallops. No edema. Pedal pulses 2+ bilaterally.  ?GASTROINTESTINAL: Soft, nontender, nondistended. No masses. Positive bowel sounds. No hepatosplenomegaly.  ?MUSCULOSKELETAL: No swelling, clubbing, or edema. Range of motion full in all extremities.  ?NEUROLOGIC: Cranial nerves II through XII are intact. No gross focal neurological deficits. Sensation intact.  Reflexes intact.  ?SKIN: No ulceration, lesions, rashes, or cyanosis. Skin warm and dry. Turgor intact.  ?PSYCHIATRIC: Mood, affect within normal limits. The patient is awake, alert and oriented x 3. Insight, judgment intact.  ? ? ?  ? ?IMAGING  ? ? ? ?ASSESSMENT/PLAN  ? ?Acute on chronic hypoxemic respiratory failure ?   Due to pneumonia worse on right with mild pleural effusion.  ?   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   - she had mild elevation of BNP ?    - repeat TTE ?    RVP + for parainfluenza 3 ? ?Moderate COPD exacerbation  ?    Agree with current antimicrobials  and steroids via prednisone ? ? ? Bilateral pleural effusions ? I suspect this is CHF related . Will diurese for now and re-evaluate CXR ?-diuresed well overnight ? ? ?Bibasilar atelectasis ?   - CXR post Metaneb with albuterol   ?    PT/OT  ?    Incentive spirometry  ? ? ? ? ? ? ? ?Thank you for allowing me to participate in the care of this patient.  ? ?Patient/Family are satisfied with care plan and all questions have been answered.  ? ? ?Provider disclosure: ?Patient with at least one acute or chronic illness or injury that poses a threat to life or bodily function and is being managed actively during this encounter.  All of the below services have been performed independently by signing provider:  review of prior documentation from internal and or external health records.  Review of previous and current lab results.  Interview and comprehensive assessment during patient visit today. Review of current and previous chest radiographs/CT scans. Discussion of management and test interpretation with health care team and patient/family.  ? ?This document was prepared using Dragon voice recognition software and may include unintentional dictation errors. ? ? ?  ?Ottie Glazier, M.D.  ?Division of Pulmonary & Critical Care Medicine  ? ? ? ? ? ? ? ? ?

## 2021-10-31 NOTE — Progress Notes (Signed)
Patient very confused. Stating, "staff is poisoning and taking her medications out of her IV." Stating staff members are talking about her and gossiping with their boyfriends at the nurses station. Patient found with oxygen off and stating staff took her oxygen off so she could not breathe. O2 sat in the 70's. Reapplied oxygen O2 came back up to 90%. Notified MD. Will pass along to night shift RN.  ? ?

## 2021-10-31 NOTE — Progress Notes (Signed)
Cross Cover ?Patient with worsening of delirium symptoms over past couple of days. This evening included paranoia and hallucinations. Required a dose of haldol to calm her down. ABG shows significant metabolic alkalosis which may be contributing factor to her worsening symptoms and has resulted from loop diuretic use.  ?-supportive care ?-hold on diuresis or use diamox if continued diuresis is necessary ?

## 2021-10-31 NOTE — Progress Notes (Signed)
Patient requests not to do metaneb at this time ?

## 2021-10-31 NOTE — Progress Notes (Signed)
I was notified by the nurse patient was in Afib RVR and feeling poorly. Rates in the 140s. IV metoprolol 5mg  was given without much improvement. Rates remained elevated and BP became low, 79/56. IV amiodarone bolus and infusion ordered.  ? ?Shelma Eiben , PA-C ?

## 2021-11-01 DIAGNOSIS — I480 Paroxysmal atrial fibrillation: Principal | ICD-10-CM

## 2021-11-01 MED ORDER — TORSEMIDE 20 MG PO TABS: 20.0000 mg | ORAL_TABLET | Freq: Every day | ORAL | Status: DC

## 2021-11-01 MED ORDER — DILTIAZEM HCL ER COATED BEADS 180 MG PO CP24
180.0000 mg | ORAL_CAPSULE | Freq: Every day | ORAL | Status: DC
  Administered 2021-11-01 – 2021-11-03 (×3): 180 mg via ORAL
  Filled 2021-11-01 (×4): qty 1

## 2021-11-01 MED ORDER — ALPRAZOLAM 0.5 MG PO TABS
0.5000 mg | ORAL_TABLET | Freq: Two times a day (BID) | ORAL | Status: DC
  Administered 2021-11-01 – 2021-11-03 (×5): 0.5 mg via ORAL
  Filled 2021-11-01 (×5): qty 1

## 2021-11-01 NOTE — Progress Notes (Signed)
?PROGRESS NOTE ? ? ? ?Virginia Norman  [id number] DOB: 04-21-1961 DOA: 10/16/2021 ? ?PCP: [facility]  ? ? ?Brief Narrative:  ?This 61 years old female with PMH significant for CHF, COPD, coronary artery disease, hypertension presented to the ED with complaints of severe shortness of breath.  She was hypoxic SPO2 69% on room air, her oxygen fell off at night.  Patient also reports productive cough, she was found to have wheezing on exam.  ABG shows pH 7.2 with PCO2 of 98%.  Patient was started on BiPAP.  Patient was admitted for acute on chronic hypoxic and hypercarbic respiratory failure secondary to CO2 retention. Patient was started on steroids and nebulized bronchodilators.  Patient has been doing better, pulmonology consulted recommended to continue diuretics.  RVP positive for parainfluenza virus.  Antibiotic discontinued. Hospital course complicated by A-fib with RVR and hypotension.  Cardiology consulted, Patient started on amiodarone gtt. Now converted to sinus rhythm.  Patient also had overnight hallucinations,  she was having delusions.  She has much improved now. ? ?Assessment & Plan: ?  ?Principal Problem: ?  Acute respiratory failure with hypoxia and hypercapnia (HCC) ?Active Problems: ?  COPD exacerbation ([place]) ?  Benign essential HTN ?  Chronic diastolic CHF (congestive heart failure) ([place]) ?  Paroxysmal atrial fibrillation (HCC) ? ?Acute on chronic hypoxic and hypercarbic respiratory failure: ?Patient presented with SPO2 69% on room air requiring BiPAP on arrival. ?ABG shows pH 7.2 with PCO2 of 98%. ?Continue BiPAP as needed and at night. ?Continue IV Solu-Medrol and nebulized bronchodilators. ?Continue supplemental oxygen and wean as tolerated. ?Could be multifactorial COPD and CHF exacerbation. ?BNP not significantly elevated. ?Pulmonology consulted recommended to continue diuretic therapy. ?RVP positive for parainfluenza.  Antibiotics discontinued. ?She is overall doing much  better. ? ?COPD exacerbation: > Improving. ?Continue nebulized bronchodilators with DuoNeb. ?Continue with steroid therapy with IV Solu-Medrol. ?Patient was empirically started on IV antibiotics. ?RVP shows parainfluenza virus , antibiotic discontinued ?Continue mucolytic therapy. ? ?Bilateral pleural effusion: ?Likely related to CHF.  Continue IV diuresis. ?Repeat chest x-ray in the morning improved findings.   ?2D echo showed LVEF 50 to 55%. ? ?Continue diastolic CHF: ?BNP not significantly elevated. ?Continue torsemide, Aldactone. ?Renal functions are stable.  HCTZ discontinued. ? ?Paroxysmal atrial fibrillation: ?Likely in the setting of bronchodilators, parainfluenza / COPD. ?Spontaneously converted to normal sinus rhythm without intervention. ?Continue Cardizem ER 120 mg daily, started on Eliquis 5 mg twice daily. ?Patient went into A-fib last night with hypotension requiring amiodarone gtt. ?Now converted to sinus and maintaining sinus rhythm. ?Plan is to continue amiodarone until acute event resolves. ?Recommended Zio patch monitoring at discharge. ? ?Hallucinations/delusions: ?Overnight patient had an episode of hallucinations, delusions. ?Could be due to CO2 retention.  Patient was found without oxygen. ?ABG shows pH 7.5 PCO2 79 ?Patient was given Haldol.  In the morning she appears much improved. ?She does not remember what happened last night. ? ?Essential hypertension: ?Continue home blood pressure medication ? ?DVT prophylaxis: Lovenox ?Code Status: Full code ?Family Communication: No family at bedside ?Disposition Plan: ? ?Status is: Inpatient ?Remains inpatient appropriate because: Admitted for acute on chronic hypoxic and hypercarbic respiratory failure secondary to possible COPD and CHF exacerbation requiring IV diuresis , IV Solu-Medrol and antibiotics, developed paroxysmal A-fib requiring Cardizem, amiodarone gtt. ?  ? ?Consultants:  ?Pulmonology ?Cardiology ? ?Procedures: Chest  x-ray ? ?Antimicrobials: ?Anti-infectives (From admission, onward)  ? ? Start     Dose/Rate Route Frequency Ordered Stop  ?  10/06/2021 2345  cefTRIAXone (ROCEPHIN) 1 g in sodium chloride 0.9 % 100 mL IVPB  Status:  Discontinued       ? 1 g ?200 mL/hr over 30 Minutes Intravenous Every 24 hours 10/11/2021 2338 10/30/21 1512  ? ?  ?  ? ?Subjective: ?Patient was seen and examined at bedside.  Overnight events noted.   ?Patient reports feeling much improved. Overnight she has an episode where she was hallucinating.  She does not remember what happened.  She went into atrial fibrillation with hypotension, started on amiodarone gtt.  Now converted to and maintaining sinus rhythm. ? ? ?Objective: ?Vitals:  ? 11/01/21 0240 11/01/21 0759 11/01/21 0823 11/01/21 1153  ?BP: 129/84 124/85  126/80  ?Pulse: 79 71  73  ?Resp: 18 17    ?Temp: 97.8 ?F (36.6 ?C) 97.7 ?F (36.5 ?C)    ?TempSrc: Oral Oral    ?SpO2: 94% 97% 97% 94%  ?Weight:      ?Height:      ? ? ?Intake/Output Summary (Last 24 hours) at 11/01/2021 1326 ?Last data filed at 11/01/2021 1153 ?Gross per 24 hour  ?Intake 910.72 ml  ?Output 1300 ml  ?Net -389.28 ml  ? ?Filed Weights  ? 10/21/2021 1845  ?Weight: 81.2 kg  ? ? ?Examination: ? ?General exam: Appears comfortable, deconditioned, not in any distress. ?Respiratory system: CTA bilaterally, no wheezing, no crackles, normal respiratory effort. ?Cardiovascular system: S1-S2 heard, regular rate and rhythm, no murmur. ?Gastrointestinal system: Abdomen is soft, non tender, non distended, BS+ ?Central nervous system: Alert and oriented x 3. No focal neurological deficits. ?Extremities: No edema, no cyanosis, no clubbing. ?Skin: No rashes, lesions or ulcers ?Psychiatry: Judgement and insight appear normal. Mood & affect appropriate.  ? ? ? ?Data Reviewed: I have personally reviewed following labs and imaging studies ? ?CBC: ?Recent Labs  ?Lab 10/18/2021 ?1850 10/29/21 ?[id number]  ?WBC 10.9* 8.3  ?NEUTROABS 9.4*  --   ?HGB 16.4* 15.6*  ?HCT  53.5* 51.3*  ?MCV 94.5 95.4  ?PLT 233 207  ? ?Basic Metabolic Panel: ?Recent Labs  ?Lab 11/02/2021 ?1850 10/29/21 ?[id number] 10/31/21 ?1259  ?NA 139 137  --   ?K 5.0 4.6  --   ?CL 93* 92*  --   ?CO2 36* 36*  --   ?GLUCOSE 121* 137*  --   ?BUN 25* 31*  --   ?CREATININE 0.65 0.52  --   ?CALCIUM 9.0 8.8*  --   ?MG  --   --  2.6*  ? ?GFR: ?Estimated Creatinine Clearance: 79.9 mL/min (by C-G formula based on SCr of 0.52 mg/dL). ?Liver Function Tests: ?No results for input(s): AST, ALT, ALKPHOS, BILITOT, PROT, ALBUMIN in the last 168 hours. ?No results for input(s): LIPASE, AMYLASE in the last 168 hours. ?No results for input(s): AMMONIA in the last 168 hours. ?Coagulation Profile: ?No results for input(s): INR, PROTIME in the last 168 hours. ?Cardiac Enzymes: ?No results for input(s): CKTOTAL, CKMB, CKMBINDEX, TROPONINI in the last 168 hours. ?BNP (last 3 results) ?No results for input(s): PROBNP in the last 8760 hours. ?HbA1C: ?No results for input(s): HGBA1C in the last 72 hours. ?CBG: ?No results for input(s): GLUCAP in the last 168 hours. ?Lipid Profile: ?No results for input(s): CHOL, HDL, LDLCALC, TRIG, CHOLHDL, LDLDIRECT in the last 72 hours. ?Thyroid Function Tests: ?Recent Labs  ?  10/31/21 ?1259  ?TSH 1.286  ? ?Anemia Panel: ?No results for input(s): VITAMINB12, FOLATE, FERRITIN, TIBC, IRON, RETICCTPCT in the last 72 hours. ?Sepsis Labs: ?No  results for input(s): PROCALCITON, LATICACIDVEN in the last 168 hours. ? ?Recent Results (from the past 240 hour(s))  ?Resp Panel by RT-PCR (Flu A&B, Covid) Nasopharyngeal Swab     Status: None  ? Collection Time: 10/13/2021 11:29 PM  ? Specimen: Nasopharyngeal Swab; Nasopharyngeal(NP) swabs in vial transport medium  ?Result Value Ref Range Status  ? SARS Coronavirus 2 by RT PCR NEGATIVE NEGATIVE Final  ?  Comment: (NOTE) ?SARS-CoV-2 target nucleic acids are NOT DETECTED. ? ?The SARS-CoV-2 RNA is generally detectable in upper respiratory ?specimens during the acute phase of  infection. The lowest ?concentration of SARS-CoV-2 viral copies this assay can detect is ?138 copies/mL. A negative result does not preclude SARS-Cov-2 ?infection and should not be used as the sole basis for treatment or ?other

## 2021-11-01 NOTE — Progress Notes (Addendum)
? ?Progress Note ? ?Patient Name: Virginia Norman ?Date of Encounter: 11/01/2021 ? ?[facility] Cardiologist: New ? ?Subjective  ? ?Yesterday patient went back into afib and became hypotensive. She was started on IV amio bolus+ infusion with conversion to SR.  ? ?Overnight reported hallucinations. She has not memory of this. Still seems somewhat paranoid on questioning. She is oriented x3.  ? ?She is in SR.  ? ?Inpatient Medications  ?  ?Scheduled Meds: ? albuterol  2.5 mg Nebulization Daily  ? apixaban  5 mg Oral BID  ? diltiazem  180 mg Oral Daily  ? empagliflozin  10 mg Oral QAC breakfast  ? guaiFENesin  600 mg Oral BID  ? multivitamin with minerals  1 tablet Oral Daily  ? predniSONE  40 mg Oral Q breakfast  ? tiotropium  18 mcg Inhalation Daily  ? ?Continuous Infusions: ? amiodarone 30 mg/hr (11/01/21 0948)  ? ?PRN Meds: ?acetaminophen **OR** acetaminophen, chlorpheniramine-HYDROcodone, diltiazem, magnesium hydroxide, metoprolol tartrate, ondansetron **OR** ondansetron (ZOFRAN) IV, traZODone  ? ?Vital Signs  ?  ?Vitals:  ? 10/31/21 2300 11/01/21 0240 11/01/21 0759 11/01/21 0823  ?BP: (!) 133/94 129/84 124/85   ?Pulse: 75 79 71   ?Resp: 18 18 17    ?Temp: 97.8 ?F (36.6 ?C) 97.8 ?F (36.6 ?C) 97.7 ?F (36.5 ?C)   ?TempSrc: Oral Oral Oral   ?SpO2: 94% 94% 97% 97%  ?Weight:      ?Height:      ? ? ?Intake/Output Summary (Last 24 hours) at 11/01/2021 1033 ?Last data filed at 11/01/2021 0800 ?Gross per 24 hour  ?Intake 910.72 ml  ?Output 2250 ml  ?Net -1339.28 ml  ? ? ?  10/19/2021  ?  6:45 PM 09/01/2021  ?  2:59 PM 07/30/2021  ?  1:52 PM  ?Last 3 Weights  ?Weight (lbs) 179 lb 182 lb 1 oz 177 lb 6 oz  ?Weight (kg) 81.194 kg 82.583 kg 80.457 kg  ?   ? ?Telemetry  ?  ?NSR, PVCs HR 70s - Personally Reviewed ? ?ECG  ?  ?No new - Personally Reviewed ? ?Physical Exam  ? ?GEN: No acute distress.   ?Neck: No JVD ?Cardiac: RRR, no murmurs, rubs, or gallops.  ?Respiratory: rhonchi, wheezing ?GI: Soft, nontender, non-distended  ?MS: No  edema; No deformity. ?Neuro:  Nonfocal  ?Psych: Normal affect  ? ?Labs  ?  ?High Sensitivity Troponin:   ?Recent Labs  ?Lab 10/04/2021 ?1904 10/08/2021 ?2118  ?TROPONINIHS 18* 19*  ?   ?Chemistry ?Recent Labs  ?Lab 10/12/2021 ?1850 10/29/21 ?[id number] 10/31/21 ?1259  ?NA 139 137  --   ?K 5.0 4.6  --   ?CL 93* 92*  --   ?CO2 36* 36*  --   ?GLUCOSE 121* 137*  --   ?BUN 25* 31*  --   ?CREATININE 0.65 0.52  --   ?CALCIUM 9.0 8.8*  --   ?MG  --   --  2.6*  ?GFRNONAA >60 >60  --   ?ANIONGAP 10 9  --   ?  ?Lipids No results for input(s): CHOL, TRIG, HDL, LABVLDL, LDLCALC, CHOLHDL in the last 168 hours.  ?Hematology ?Recent Labs  ?Lab 10/17/2021 ?1850 10/29/21 ?[id number]  ?WBC 10.9* 8.3  ?RBC 5.66* 5.38*  ?HGB 16.4* 15.6*  ?HCT 53.5* 51.3*  ?MCV 94.5 95.4  ?MCH 29.0 29.0  ?MCHC 30.7 30.4  ?RDW 13.2 13.2  ?PLT 233 207  ? ?Thyroid  ?Recent Labs  ?Lab 10/31/21 ?1259  ?TSH 1.286  ?  ?BNP ?  Recent Labs  ?Lab 10/27/2021 ?1904  ?BNP 129.0*  ?  ?DDimer No results for input(s): DDIMER in the last 168 hours.  ? ?Radiology  ?  ?No results found. ? ?Cardiac Studies  ? ?Echo 10/30/21 ? 1. Left ventricular ejection fraction, by estimation, is 50 to 55%. The  ?left ventricle has low normal function. The left ventricle has no regional  ?wall motion abnormalities. Left ventricular diastolic parameters are  ?consistent with Grade I diastolic  ?dysfunction (impaired relaxation).  ? 2. Right ventricular systolic function is normal. The right ventricular  ?size is normal.  ? 3. The mitral valve is normal in structure. Trivial mitral valve  ?regurgitation. No evidence of mitral stenosis.  ? 4. The aortic valve is normal in structure. Aortic valve regurgitation is  ?not visualized. Aortic valve sclerosis is present, with no evidence of  ?aortic valve stenosis.  ? 5. The inferior vena cava is normal in size with greater than 50%  ?respiratory variability, suggesting right atrial pressure of 3 mmHg.  ? ?Patient Profile  ?   ?61 y.o. female with a hx of CHF, COPD, CAD,  HTN who is being seen 10/31/2021 for the evaluation of PAF. ? ?Assessment & Plan  ?  ?Paroxysmal Afib ?- presented with acute respiratory failure found to have moderate COPD exacerbation and parainfluenza found to have Afib RVR with rates in the 140s with initial conversion, eventually was back in afib RVR ?- she became hypotensive with Afib RVR and IV amio was started with conversion to NSR ?- CHADSVASC at least 3. Continue Eliquis 5mg  BID ?- Echo showed LVEF 50-55%, no WMA, G1DD, normal RVSF ?- check TSH and Mag ?- may need heart monitor at discharge ? - patient is not a good long term candidate for amiodarone given age and underlying lung disease.  ?- continue diltizem 180mg  daily ? ?HFpEF ?- BNP mildly elevated and CXR with small b/l pleural effusions ?- s/p torsemide/spironolactone x 3 doses ?- PTA Jardiance 10mg  and HCTZ 12.5mg  daily ?- Appears euvolemic on exam ?- continue OP follow-up ?  ?Parainfluenza+ ?Acute on chronic COP ?- patient is back to baseline 4L O2 ? ?Hallucinations/Paranoia ?- per IM ?  ? ?For questions or updates, please contact [place] ?Please consult [email or web address] for contact info under  ? ?  ?   ?Signed, ?Manasa Spease Ninfa Meeker, PA-C  ?11/01/2021, 10:33 AM    ?

## 2021-11-01 NOTE — Hospital Course (Signed)
This 61 years old female with PMH significant for CHF, COPD, coronary artery disease, hypertension presented to the ED with complaints of severe shortness of breath.  She was hypoxic SPO2 69% on room air, her oxygen fell off at night.  Patient also reports productive cough, she was found to have wheezing on exam.  ABG shows pH 7.2 with PCO2 of 98%.  Patient was started on BiPAP.  Patient was admitted for acute on chronic hypoxic and hypercarbic respiratory failure secondary to CO2 retention. Patient was started on steroids and nebulized bronchodilators.  Patient has been doing better, pulmonology consulted recommended to continue diuretics.  RVP positive for parainfluenza virus.  Antibiotic discontinued.  Hospital course complicated by A-fib with RVR and hypotension.  Cardiology consulted patient started on amiodarone gtt.  Patient also had overnight hallucinations she was having delusions.  She has much improved now. ?

## 2021-11-01 NOTE — Progress Notes (Signed)
Assumed care of pt at 1900. A&O x3. On 3-4 L [place] overnight. Per day shift RN pt hallucinating, provider made aware and pt medicated per MAR. No hallucinations noted overnight. Pt calm and cooperative. Sleeping on/off for a few hours at a time.  ? ?ABGs obtained, provider made aware of result. No new orders at this time.  ? ?Call bell within reach, making needs known. Bed alarm on. Family at bedside for most of the night.  ? ?Amio gtt infusing per MAR. ?

## 2021-11-01 NOTE — Progress Notes (Signed)
? ? ? ?PULMONOLOGY ? ? ? ? ? ? ? ? ?Date: 11/01/2021,   ?MRN# [id number] Virginia Norman March 07, 1961 ? ? ?  ?AdmissionWeight: 81.2 kg                 ?CurrentWeight: 81.2 kg ? ?Referring provider: Dr Dwyane Dee ? ? ?CHIEF COMPLAINT:  ? ?Acute COPD exacerbation ? ? ?HISTORY OF PRESENT ILLNESS  ? ?Patient with hx of COPD who was a life long smoker who quit smoking few months ago after last hospitalization for acute COPD exacerbation . She has CHF and CAD with HTN. She is accompanied by husband to day who helps with details of history.  She was noted by family to be acutely short of breath with acute hypoxemia and she noted no swelling acutely and had no chest pain. She uses O2 for chronic hypoxemia and is on 3-4L/min. Despite this she was still very weak and could not ventilate.  ? ?10/31/21- patietn respiratory status is improved but she had AF episode again and cardiology consultation is ordered. I stopped nebulzier duoneb to reduce risk of AF episodes.  She diuresed well and no longer requires diuretics , I have dcd torsemide and aldactone. Patient is doing better can be Dcd home once cleared from cardio perspective ? ?11/01/21- patient seen andevaluated at bedside , she reports improved breathing. She appears depressed and anxious.  She asked me to close door so we can talk but then would not share with me whats bothering her. She stated the anxiety started this moring.  She denies alcoholism and denies withdrawal.  She does admit to illict drug use with [facility] shares her nerves are acting up.  She is contracted and should not have more Torsemide because her CO2 levels will continue to increase ? ? ?PAST MEDICAL HISTORY  ? ?Past Medical History:  ?Diagnosis Date  ? CHF (congestive heart failure) ([place])   ? COPD (chronic obstructive pulmonary disease) ([place])   ? Coronary artery disease   ? Hypertension   ? ? ? ?SURGICAL HISTORY  ? ?Past Surgical History:  ?Procedure Laterality Date  ? ABDOMINAL HYSTERECTOMY    ? ? ? ?FAMILY  HISTORY  ? ?Family History  ?Problem Relation Age of Onset  ? Arrhythmia Mother   ? Hypertension Mother   ? Heart disease Mother   ? Stomach cancer Father   ? Lupus Sister   ? ? ? ?SOCIAL HISTORY  ? ?Social History  ? ?Tobacco Use  ? Smoking status: Former  ?  Packs/day: 0.75  ?  Years: 44.00  ?  Pack years: 33.00  ?  Types: Cigarettes  ? Smokeless tobacco: Never  ?Vaping Use  ? Vaping Use: Never used  ?Substance Use Topics  ? Alcohol use: Never  ? Drug use: Never  ? ? ? ?MEDICATIONS  ? ? ?Home Medication:  ?  ?Current Medication: ? ?Current Facility-Administered Medications:  ?  acetaminophen (TYLENOL) tablet 650 mg, 650 mg, Oral, Q6H PRN **OR** acetaminophen (TYLENOL) suppository 650 mg, 650 mg, Rectal, Q6H PRN, Mansy, Jan A, MD ?  albuterol (PROVENTIL) (2.5 MG/3ML) 0.083% nebulizer solution 2.5 mg, 2.5 mg, Nebulization, Daily, Galit Urich, MD, 2.5 mg at 11/01/21 [id number] ?  amiodarone (NEXTERONE PREMIX) 360-4.14 MG/200ML-% (1.8 mg/mL) IV infusion, 30 mg/hr, Intravenous, Continuous, Furth, Cadence H, PA-C, Last Rate: 16.67 mL/hr at 11/01/21 0948, 30 mg/hr at 11/01/21 0948 ?  apixaban (ELIQUIS) tablet 5 mg, 5 mg, Oral, BID, Furth, Cadence H, PA-C, 5 mg at 11/01/21 0915 ?  chlorpheniramine-HYDROcodone 10-8 MG/5ML suspension 5 mL, 5 mL, Oral, Q12H PRN, Mansy, Jan A, MD ?  diltiazem (CARDIZEM CD) 24 hr capsule 180 mg, 180 mg, Oral, Daily, Agbor-Etang, Brian, MD, 180 mg at 11/01/21 0915 ?  diltiazem (CARDIZEM) tablet 30 mg, 30 mg, Oral, Q8H PRN, Shawna Clamp, MD ?  empagliflozin (JARDIANCE) tablet 10 mg, 10 mg, Oral, QAC breakfast, Mansy, Jan A, MD, 10 mg at 11/01/21 [id number] ?  guaiFENesin (MUCINEX) 12 hr tablet 600 mg, 600 mg, Oral, BID, Mansy, Jan A, MD, 600 mg at 11/01/21 [id number] ?  magnesium hydroxide (MILK OF MAGNESIA) suspension 30 mL, 30 mL, Oral, Daily PRN, Mansy, Jan A, MD, 30 mL at 10/29/21 2102 ?  metoprolol tartrate (LOPRESSOR) injection 5 mg, 5 mg, Intravenous, Q5 min PRN, Furth, Cadence H, PA-C, 5 mg at  10/31/21 1355 ?  multivitamin with minerals tablet 1 tablet, 1 tablet, Oral, Daily, Mansy, Jan A, MD, 1 tablet at 11/01/21 0915 ?  ondansetron (ZOFRAN) tablet 4 mg, 4 mg, Oral, Q6H PRN **OR** ondansetron (ZOFRAN) injection 4 mg, 4 mg, Intravenous, Q6H PRN, Mansy, Jan A, MD, 4 mg at 10/29/21 0024 ?  [COMPLETED] methylPREDNISolone sodium succinate (SOLU-MEDROL) 40 mg/mL injection 40 mg, 40 mg, Intravenous, Q12H, 40 mg at 10/29/21 1214 **FOLLOWED BY** predniSONE (DELTASONE) tablet 40 mg, 40 mg, Oral, Q breakfast, Mansy, Jan A, MD, 40 mg at 11/01/21 0915 ?  tiotropium (SPIRIVA) inhalation capsule ([facility] use ONLY) 18 mcg, 18 mcg, Inhalation, Daily, Mansy, Jan A, MD, 18 mcg at 11/01/21 [id number] ?  torsemide (DEMADEX) tablet 20 mg, 20 mg, Oral, Daily, Agbor-Etang, Aaron Edelman, MD ?  traZODone (DESYREL) tablet 25 mg, 25 mg, Oral, QHS PRN, Mansy, Jan A, MD, 25 mg at 10/31/21 2100 ? ? ? ?ALLERGIES  ? ?Patient has no known allergies. ? ? ? ? ?REVIEW OF SYSTEMS  ? ? ?Review of Systems: ? ?Gen:  Denies  fever, sweats, chills weigh loss  ?HEENT: Denies blurred vision, double vision, ear pain, eye pain, hearing loss, nose bleeds, sore throat ?Cardiac:  No dizziness, chest pain or heaviness, chest tightness,edema ?Resp:   reports dyspnea chronically  ?Gi: Denies swallowing difficulty, stomach pain, nausea or vomiting, diarrhea, constipation, bowel incontinence ?Gu:  Denies bladder incontinence, burning urine ?Ext:   Denies Joint pain, stiffness or swelling ?Skin: Denies  skin rash, easy bruising or bleeding or hives ?Endoc:  Denies polyuria, polydipsia , polyphagia or weight change ?Psych:   Denies depression, insomnia or hallucinations  ? ?Other:  All other systems negative ? ? ?VS: BP 126/80   Pulse 73   Temp 97.7 ?F (36.5 ?C) (Oral)   Resp 17   Ht 5\' 7"  (1.702 m)   Wt 81.2 kg   SpO2 94%   BMI 28.04 kg/m?   ? ? ? ?PHYSICAL EXAM  ? ? ?GENERAL:NAD, no fevers, chills, no weakness no fatigue ?HEAD: Normocephalic, atraumatic.  ?EYES:  Pupils equal, round, reactive to light. Extraocular muscles intact. No scleral icterus.  ?MOUTH: Moist mucosal membrane. Dentition intact. No abscess noted.  ?EAR, NOSE, THROAT: Clear without exudates. No external lesions.  ?NECK: Supple. No thyromegaly. No nodules. No JVD.  ?PULMONARY: decreased breath sounds with mild rhonchi worse at bases bilaterally.  ?CARDIOVASCULAR: S1 and S2. Regular rate and rhythm. No murmurs, rubs, or gallops. No edema. Pedal pulses 2+ bilaterally.  ?GASTROINTESTINAL: Soft, nontender, nondistended. No masses. Positive bowel sounds. No hepatosplenomegaly.  ?MUSCULOSKELETAL: No swelling, clubbing, or edema. Range of motion full in all extremities.  ?NEUROLOGIC: Cranial nerves  II through XII are intact. No gross focal neurological deficits. Sensation intact. Reflexes intact.  ?SKIN: No ulceration, lesions, rashes, or cyanosis. Skin warm and dry. Turgor intact.  ?PSYCHIATRIC: Mood, affect within normal limits. The patient is awake, alert and oriented x 3. Insight, judgment intact.  ? ? ?  ? ?IMAGING  ? ? ? ?ASSESSMENT/PLAN  ? ?Acute on chronic hypoxemic respiratory failure ?   Due to pneumonia worse on right with mild pleural effusion.  ?   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   - she had mild elevation of BNP ?    - repeat TTE ?    RVP + for parainfluenza 3 ? ?Moderate COPD exacerbation  ?    Agree with current antimicrobials and steroids via prednisone ? ? ? Bilateral pleural effusions ? I suspect this is CHF related . Will diurese for now and re-evaluate CXR ?-diuresed well overnight ? ? ?Bibasilar atelectasis ?   - CXR post Metaneb with albuterol   ?    PT/OT  ?    Incentive spirometry  ? ? ? ? ? ? ? ?Thank you for allowing me to participate in the care of this patient.  ? ?Patient/Family are satisfied with care plan and all questions have been answered.  ? ? ?Provider disclosure: ?Patient with at least one acute or chronic illness or injury that poses a threat to life or  bodily function and is being managed actively during this encounter.  All of the below services have been performed independently by signing provider:  review of prior documentation from internal and or external health record

## 2021-11-02 ENCOUNTER — Inpatient Hospital Stay: Payer: Self-pay

## 2021-11-02 LAB — CBC
HCT: 52.7 % — ABNORMAL HIGH (ref 36.0–46.0)
Hemoglobin: 16.1 g/dL — ABNORMAL HIGH (ref 12.0–15.0)
MCH: 28.5 pg (ref 26.0–34.0)
MCHC: 30.6 g/dL (ref 30.0–36.0)
MCV: 93.3 fL (ref 80.0–100.0)
Platelets: 279 10*3/uL (ref 150–400)
RBC: 5.65 MIL/uL — ABNORMAL HIGH (ref 3.87–5.11)
RDW: 12.7 % (ref 11.5–15.5)
WBC: 9.3 10*3/uL (ref 4.0–10.5)
nRBC: 0 % (ref 0.0–0.2)

## 2021-11-02 LAB — BLOOD GAS, ARTERIAL
Acid-Base Excess: 26 mmol/L — ABNORMAL HIGH (ref 0.0–2.0)
Acid-Base Excess: 35.3 mmol/L — ABNORMAL HIGH (ref 0.0–2.0)
Bicarbonate: 61.3 mmol/L — ABNORMAL HIGH (ref 20.0–28.0)
Bicarbonate: 69.5 mmol/L — ABNORMAL HIGH (ref 20.0–28.0)
Delivery systems: POSITIVE
Expiratory PAP: 6 cmH2O
FIO2: 100 %
FIO2: 50 %
Inspiratory PAP: 16 cmH2O
O2 Saturation: 99 %
O2 Saturation: 98 %
Patient temperature: 37
Patient temperature: 37
pCO2 arterial: >123 mmHg (ref 32–48)
pCO2 arterial: 123 mmHg (ref 32–48)
pH, Arterial: 7.24 — ABNORMAL LOW (ref 7.35–7.45)
pH, Arterial: 7.36 (ref 7.35–7.45)
pO2, Arterial: 237 mmHg — ABNORMAL HIGH (ref 83–108)
pO2, Arterial: 85 mmHg (ref 83–108)

## 2021-11-02 LAB — BASIC METABOLIC PANEL
BUN: 23 mg/dL (ref 8–23)
CO2: >45 mmol/L — ABNORMAL HIGH (ref 22–32)
Calcium: 8.8 mg/dL — ABNORMAL LOW (ref 8.9–10.3)
Chloride: 80 mmol/L — ABNORMAL LOW (ref 98–111)
Creatinine, Ser: 0.51 mg/dL (ref 0.44–1.00)
GFR, Estimated: >60 mL/min (ref >60)
Glucose, Bld: 137 mg/dL — ABNORMAL HIGH (ref 70–99)
Potassium: 4.1 mmol/L (ref 3.5–5.1)
Sodium: 137 mmol/L (ref 135–145)

## 2021-11-02 LAB — MRSA NEXT GEN BY PCR, NASAL: MRSA by PCR Next Gen: NOT DETECTED

## 2021-11-02 LAB — BLOOD GAS, VENOUS
Acid-Base Excess: 28.1 mmol/L — ABNORMAL HIGH (ref 0.0–2.0)
Bicarbonate: 63.3 mmol/L — ABNORMAL HIGH (ref 20.0–28.0)
O2 Saturation: 93 %
Patient temperature: 37
pCO2, Ven: >123 mmHg (ref 44–60)
pH, Ven: 7.26 (ref 7.25–7.43)
pO2, Ven: 69 mmHg — ABNORMAL HIGH (ref 32–45)

## 2021-11-02 LAB — GLUCOSE, CAPILLARY: Glucose-Capillary: 155 mg/dL — ABNORMAL HIGH (ref 70–99)

## 2021-11-02 LAB — MAGNESIUM: Magnesium: 2.8 mg/dL — ABNORMAL HIGH (ref 1.7–2.4)

## 2021-11-02 LAB — PHOSPHORUS: Phosphorus: 6 mg/dL — ABNORMAL HIGH (ref 2.5–4.6)

## 2021-11-02 MED ORDER — CHLORHEXIDINE GLUCONATE 0.12 % MT SOLN
15.0000 mL | Freq: Two times a day (BID) | OROMUCOSAL | Status: DC
  Administered 2021-11-02 – 2021-11-03 (×3): 15 mL via OROMUCOSAL
  Filled 2021-11-02 (×2): qty 15

## 2021-11-02 MED ORDER — ORAL CARE MOUTH RINSE
15.0000 mL | Freq: Two times a day (BID) | OROMUCOSAL | Status: DC
  Administered 2021-11-03: 15 mL via OROMUCOSAL

## 2021-11-02 MED ORDER — CHLORHEXIDINE GLUCONATE CLOTH 2 % EX PADS
6.0000 | MEDICATED_PAD | Freq: Every day | CUTANEOUS | Status: DC
  Administered 2021-11-02 – 2021-11-12 (×11): 6 via TOPICAL

## 2021-11-02 NOTE — Progress Notes (Signed)
Upon entering room. Patient pulled o2 off. Cyanotic, diaphoretic with abdominal breathing. Sat in low 60%. NRB was placed. Both respiratory and ccu nurse called to assess patient. Was on bipap sat increased to 90's quickly, color improved and no longer diaphoretic. Abg was ordered. Dr. Dwyane Dee was also made aware. ?

## 2021-11-02 NOTE — Progress Notes (Signed)
?PROGRESS NOTE ? ? ? ?Virginia Norman  WPY:[id number] DOB: 1960-12-06 DOA: 10/19/2021 ? ?PCP: [facility]  ? ? ?Brief Narrative:  ?This 61 years old female with PMH significant for CHF, COPD, coronary artery disease, hypertension presented to the ED with complaints of severe shortness of breath.  She was hypoxic SPO2 69% on room air, her oxygen fell off at night.  Patient also reports productive cough, she was found to have wheezing on exam.  ABG shows pH 7.2 with PCO2 of 98%.  Patient was started on BiPAP.  Patient was admitted for acute on chronic hypoxic and hypercarbic respiratory failure secondary to CO2 retention. Patient was started on steroids and nebulized bronchodilators.  Patient has been doing better, pulmonology consulted recommended to continue diuretics.  RVP positive for parainfluenza virus.  Antibiotic discontinued. Hospital course complicated by A-fib with RVR and hypotension.  Cardiology consulted, Patient started on amiodarone gtt. Now converted to sinus rhythm.  Patient also had overnight hallucinations,  she was having delusions.  She has much improved now. ? ?Assessment & Plan: ?  ?Principal Problem: ?  Acute respiratory failure with hypoxia and hypercapnia (HCC) ?Active Problems: ?  COPD exacerbation (HCC) ?  Benign essential HTN ?  Chronic diastolic CHF (congestive heart failure) (HCC) ?  Paroxysmal atrial fibrillation (HCC) ? ?Acute on chronic hypoxic and hypercarbic respiratory failure: ?Patient presented with SPO2 69% on room air requiring BiPAP on arrival. ?ABG shows pH 7.2 with PCO2 of 98%. ?Continue BiPAP as needed and at night. ?Continue IV Solu-Medrol and nebulized bronchodilators. ?Continue supplemental oxygen and wean as tolerated. ?Could be multifactorial COPD and CHF exacerbation. ?BNP not significantly elevated. ?Pulmonology consulted recommended to continue diuretic therapy. ?RVP positive for parainfluenza.  Antibiotics discontinued. ?She is overall doing much  better. ? ?COPD exacerbation: > Improving. ?Continue nebulized bronchodilators with DuoNeb. ?Continue with steroid therapy with IV Solu-Medrol. ?Patient was empirically started on IV antibiotics. ?RVP shows parainfluenza virus , antibiotic discontinued ?Continue mucolytic therapy. ? ?Bilateral pleural effusion: ?Likely related to CHF.  Continued  IV diuresis. ?Repeat chest x-ray in the morning improved findings.   ?2D echo showed LVEF 50 to 55%. ? ?Chronic diastolic CHF: ?BNP not significantly elevated. ?Discontinue diuretics as she has developed contraction alkaosis ?Renal functions are stable.  HCTZ discontinued. ? ?Paroxysmal atrial fibrillation: ?Likely in the setting of bronchodilators, parainfluenza / COPD. ?Spontaneously converted to normal sinus rhythm without intervention. ?Continue Cardizem ER 180 mg daily, started on Eliquis 5 mg twice daily. ?Patient went into A-fib last with hypotension requiring amiodarone gtt. ?Now converted to sinus and maintaining sinus rhythm. ?Plan is to continue amiodarone until acute event resolves. ?Recommended Zio patch monitoring at discharge. ? ?Hallucinations/delusions: > Resolved. ?Overnight patient had an episode of hallucinations, delusions. ?Could be due to CO2 retention.  Patient was found without oxygen. ?ABG shows pH 7.5 PCO2 79 ?Patient was given Haldol.  In the morning she appears much improved. ?She does not remember what happened last night. ? ? ?Essential hypertension: ?Continue home blood pressure medication ? ?DVT prophylaxis: Lovenox ?Code Status: Full code ?Family Communication: No family at bedside ?Disposition Plan: ? ?Status is: Inpatient ?Remains inpatient appropriate because: Admitted for acute on chronic hypoxic and hypercarbic respiratory failure secondary to possible COPD and CHF exacerbation requiring IV diuresis , IV Solu-Medrol and antibiotics, developed paroxysmal A-fib requiring Cardizem, amiodarone gtt. Maintaining NSR. ?  ? ?Consultants:   ?Pulmonology ?Cardiology ? ?Procedures: Chest x-ray ? ?Antimicrobials: ?Anti-infectives (From admission, onward)  ? ? Start  Dose/Rate Route Frequency Ordered Stop  ? 11-03-2021 2345  cefTRIAXone (ROCEPHIN) 1 g in sodium chloride 0.9 % 100 mL IVPB  Status:  Discontinued       ? 1 g ?200 mL/hr over 30 Minutes Intravenous Every 24 hours 03-Nov-2021 2338 10/30/21 1512  ? ?  ?  ? ?Subjective: ?Patient was seen and examined at bedside.  Overnight events noted.   ?Patient reports feeling much improved.  Overnight she slept well. ?Her heart rate remains in sinus rhythm, reports she is feeling better and close to baseline. ? ?Objective: ?Vitals:  ? 11/02/21 0900 11/02/21 0910 11/02/21 1040 11/02/21 1153  ?BP:    (!) 145/79  ?Pulse:    67  ?Resp:    18  ?Temp:    97.9 ?F (36.6 ?C)  ?TempSrc:      ?SpO2: (!) 84% 93% 96% 93%  ?Weight:      ?Height:      ? ? ?Intake/Output Summary (Last 24 hours) at 11/02/2021 1341 ?Last data filed at 11/02/2021 1220 ?Gross per 24 hour  ?Intake 384.64 ml  ?Output 1450 ml  ?Net -1065.36 ml  ? ?Filed Weights  ? 03-Nov-2021 1845  ?Weight: 81.2 kg  ? ? ?Examination: ? ?General exam: Appears comfortable, deconditioned, not in distress. ?Respiratory system: CTA bilaterally, no wheezing, no crackles, normal respiratory effort. ?Cardiovascular system: S1-S2 heard, regular rate and rhythm, no murmur. ?Gastrointestinal system: Abdomen is soft, non tender, non distended, BS+ ?Central nervous system: Alert and oriented x 3. No focal neurological deficits. ?Extremities: No edema, no cyanosis, no clubbing. ?Skin: No rashes, lesions or ulcers ?Psychiatry: Mood, judgment, insight normal. ? ? ?Data Reviewed: I have personally reviewed following labs and imaging studies ? ?CBC: ?Recent Labs  ?Lab 11-03-21 ?1850 10/29/21 ?0552 11/02/21 ?4401  ?WBC 10.9* 8.3 9.3  ?NEUTROABS 9.4*  --   --   ?HGB 16.4* 15.6* 16.1*  ?HCT 53.5* 51.3* 52.7*  ?MCV 94.5 95.4 93.3  ?PLT 233 207 279  ? ?Basic Metabolic Panel: ?Recent Labs  ?Lab  11/03/21 ?1850 10/29/21 ?0552 10/31/21 ?1259 11/02/21 ?0272  ?NA 139 137  --  137  ?K 5.0 4.6  --  4.1  ?CL 93* 92*  --  80*  ?CO2 36* 36*  --  >45*  ?GLUCOSE 121* 137*  --  137*  ?BUN 25* 31*  --  23  ?CREATININE 0.65 0.52  --  0.51  ?CALCIUM 9.0 8.8*  --  8.8*  ?MG  --   --  2.6* 2.8*  ?PHOS  --   --   --  6.0*  ? ?GFR: ?Estimated Creatinine Clearance: 79.9 mL/min (by C-G formula based on SCr of 0.51 mg/dL). ?Liver Function Tests: ?No results for input(s): AST, ALT, ALKPHOS, BILITOT, PROT, ALBUMIN in the last 168 hours. ?No results for input(s): LIPASE, AMYLASE in the last 168 hours. ?No results for input(s): AMMONIA in the last 168 hours. ?Coagulation Profile: ?No results for input(s): INR, PROTIME in the last 168 hours. ?Cardiac Enzymes: ?No results for input(s): CKTOTAL, CKMB, CKMBINDEX, TROPONINI in the last 168 hours. ?BNP (last 3 results) ?No results for input(s): PROBNP in the last 8760 hours. ?HbA1C: ?No results for input(s): HGBA1C in the last 72 hours. ?CBG: ?No results for input(s): GLUCAP in the last 168 hours. ?Lipid Profile: ?No results for input(s): CHOL, HDL, LDLCALC, TRIG, CHOLHDL, LDLDIRECT in the last 72 hours. ?Thyroid Function Tests: ?Recent Labs  ?  10/31/21 ?1259  ?TSH 1.286  ? ?Anemia Panel: ?No results for  input(s): VITAMINB12, FOLATE, FERRITIN, TIBC, IRON, RETICCTPCT in the last 72 hours. ?Sepsis Labs: ?No results for input(s): PROCALCITON, LATICACIDVEN in the last 168 hours. ? ?Recent Results (from the past 240 hour(s))  ?Resp Panel by RT-PCR (Flu A&B, Covid) Nasopharyngeal Swab     Status: None  ? Collection Time: 06-06-2022 11:29 PM  ? Specimen: Nasopharyngeal Swab; Nasopharyngeal(NP) swabs in vial transport medium  ?Result Value Ref Range Status  ? SARS Coronavirus 2 by RT PCR NEGATIVE NEGATIVE Final  ?  Comment: (NOTE) ?SARS-CoV-2 target nucleic acids are NOT DETECTED. ? ?The SARS-CoV-2 RNA is generally detectable in upper respiratory ?specimens during the acute phase of infection. The  lowest ?concentration of SARS-CoV-2 viral copies this assay can detect is ?138 copies/mL. A negative result does not preclude SARS-Cov-2 ?infection and should not be used as the sole basis for treatment o

## 2021-11-02 NOTE — Progress Notes (Addendum)
Repeat VBG resulted, CO2 still greater than 123. Bp also decreased 92/60 map 70. Dr. Lucianne Muss was made aware. Waiting for md to return page. Md will order ICU evaluation to determine if patient needs to be transferred. Patient still very drowsy, only opens eye with sternal rub and the falls back to sleep ?

## 2021-11-02 NOTE — Progress Notes (Signed)
Patients daughter Linton Rump was called and made aware patients current status and that she was transferred to CCU stepdown room 12 ?

## 2021-11-02 NOTE — Progress Notes (Signed)
? ?Progress Note ? ?Patient Name: Virginia Norman ?Date of Encounter: 11/02/2021 ? ?[facility] Cardiologist: new- Dr. Rockey Situ ? ?Subjective  ? ?Shortness of breath better, denies chest pain.  Eating breakfast.  Torsemide stopped recently due to contraction alkalosis.  Denies edema. ? ?Inpatient Medications  ?  ?Scheduled Meds: ? albuterol  2.5 mg Nebulization Daily  ? ALPRAZolam  0.5 mg Oral BID  ? apixaban  5 mg Oral BID  ? diltiazem  180 mg Oral Daily  ? empagliflozin  10 mg Oral QAC breakfast  ? guaiFENesin  600 mg Oral BID  ? multivitamin with minerals  1 tablet Oral Daily  ? tiotropium  18 mcg Inhalation Daily  ? ?Continuous Infusions: ? amiodarone 30 mg/hr (11/02/21 0549)  ? ?PRN Meds: ?acetaminophen **OR** acetaminophen, chlorpheniramine-HYDROcodone, magnesium hydroxide, metoprolol tartrate, ondansetron **OR** ondansetron (ZOFRAN) IV, traZODone  ? ?Vital Signs  ?  ?Vitals:  ? 11/02/21 0549 11/02/21 0809 11/02/21 0900 11/02/21 0910  ?BP: 139/79 135/79    ?Pulse: 60 (!) 59    ?Resp: 20 19    ?Temp:  97.9 ?F (36.6 ?C)    ?TempSrc:  Oral    ?SpO2: 99% 100% (!) 84% 93%  ?Weight:      ?Height:      ? ? ?Intake/Output Summary (Last 24 hours) at 11/02/2021 1031 ?Last data filed at 11/02/2021 0809 ?Gross per 24 hour  ?Intake 182.2 ml  ?Output 1450 ml  ?Net -1267.8 ml  ? ? ?  10/16/2021  ?  6:45 PM 09/01/2021  ?  2:59 PM 07/30/2021  ?  1:52 PM  ?Last 3 Weights  ?Weight (lbs) 179 lb 182 lb 1 oz 177 lb 6 oz  ?Weight (kg) 81.194 kg 82.583 kg 80.457 kg  ?   ? ?Telemetry  ?  ?Sinus rhythm, heart rate 72- Personally Reviewed ? ?ECG  ?  ? - Personally Reviewed ? ?Physical Exam  ? ?GEN: No acute distress.   ?Neck: No JVD ?Cardiac: RRR ?Respiratory: Decreased breath sounds, mild wheezing ?GI: Soft, nontender, non-distended  ?MS: No edema; No deformity. ?Neuro:  Nonfocal  ?Psych: Normal affect  ? ?Labs  ?  ?High Sensitivity Troponin:   ?Recent Labs  ?Lab 11/02/2021 ?1904 10/31/2021 ?2118  ?TROPONINIHS 18* 19*  ?   ?Chemistry ?Recent  Labs  ?Lab 10/27/2021 ?1850 10/29/21 ?[id number] 10/31/21 ?1259 11/02/21 ?0512  ?NA 139 137  --  137  ?K 5.0 4.6  --  4.1  ?CL 93* 92*  --  80*  ?CO2 36* 36*  --  >45*  ?GLUCOSE 121* 137*  --  137*  ?BUN 25* 31*  --  23  ?CREATININE 0.65 0.52  --  0.51  ?CALCIUM 9.0 8.8*  --  8.8*  ?MG  --   --  2.6* 2.8*  ?GFRNONAA >60 >60  --  >60  ?ANIONGAP 10 9  --  NOT CALCULATED  ?  ?Lipids No results for input(s): CHOL, TRIG, HDL, LABVLDL, LDLCALC, CHOLHDL in the last 168 hours.  ?Hematology ?Recent Labs  ?Lab 10/22/2021 ?1850 10/29/21 ?[id number] 11/02/21 ?0512  ?WBC 10.9* 8.3 9.3  ?RBC 5.66* 5.38* 5.65*  ?HGB 16.4* 15.6* 16.1*  ?HCT 53.5* 51.3* 52.7*  ?MCV 94.5 95.4 93.3  ?MCH 29.0 29.0 28.5  ?MCHC 30.7 30.4 30.6  ?RDW 13.2 13.2 12.7  ?PLT 233 207 279  ? ?Thyroid  ?Recent Labs  ?Lab 10/31/21 ?1259  ?TSH 1.286  ?  ?BNP ?Recent Labs  ?Lab 10/20/2021 ?1904  ?BNP 129.0*  ?  ?  DDimer No results for input(s): DDIMER in the last 168 hours.  ? ?Radiology  ?  ?No results found. ? ?Cardiac Studies  ? ?TTE 10/30/2021 ?1. Left ventricular ejection fraction, by estimation, is 50 to 55%. The  ?left ventricle has low normal function. The left ventricle has no regional  ?wall motion abnormalities. Left ventricular diastolic parameters are  ?consistent with Grade I diastolic  ?dysfunction (impaired relaxation).  ? 2. Right ventricular systolic function is normal. The right ventricular  ?size is normal.  ? 3. The mitral valve is normal in structure. Trivial mitral valve  ?regurgitation. No evidence of mitral stenosis.  ? 4. The aortic valve is normal in structure. Aortic valve regurgitation is  ?not visualized. Aortic valve sclerosis is present, with no evidence of  ?aortic valve stenosis.  ? 5. The inferior vena cava is normal in size with greater than 50%  ?respiratory variability, suggesting right atrial pressure of 3 mmHg. ? ?Patient Profile  ?   ?61 y.o. female with history of hypertension, HFpEF, COPD presenting with worsening shortness of breath,  diagnosed with parainfluenza infection, COPD exacerbation, being seen for A-fib with RVR. ? ?Assessment & Plan  ?  ?1.  A-fib RVR ?-Maintaining sinus rhythm ?-A-fib likely driven by acute illness/parainfluenza infection ?-Amiodarone long-term not ideal due to baseline pulmonary dysfunction ?-Continue amiodarone for now while acute illness is being managed ?-continue Cardizem to 180 mg daily ?-Eliquis 5 twice daily ?-recommend stopping amiodarone after acute illness improves, hopefully patient maintains sinus rhythm. ?  ?2.  HFpEF, pleural effusions ?-Appears euvolemic ?-hold Diuretics due to contraction alkalosis ?  ?3.  COPD exacerbation, parainfluenza infection, pneumonia ?-Management as per primary and pulmonary team ?  ?Greater than 50% was spent in counseling and coordination of care with patient ?Total encounter time 50 minutes  ? ?  ?   ?Signed, ?Kate Sable, MD  ?11/02/2021, 10:31 AM    ?

## 2021-11-02 NOTE — Progress Notes (Signed)
? ? ? ?PULMONOLOGY ? ? ? ? ? ? ? ? ?Date: 11/02/2021,   ?MRN# [id number] Virginia PigeonCindy Norman [birth year] ? ? ?  ?AdmissionWeight: 81.2 kg                 ?CurrentWeight: 81.2 kg ? ?Referring provider: Dr Lucianne MussKumar ? ? ?CHIEF COMPLAINT:  ? ?Acute COPD exacerbation ? ? ?HISTORY OF PRESENT ILLNESS  ? ?Patient with hx of COPD who was a life long smoker who quit smoking few months ago after last hospitalization for acute COPD exacerbation . She has CHF and CAD with HTN. She is accompanied by husband to day who helps with details of history.  She was noted by family to be acutely short of breath with acute hypoxemia and she noted no swelling acutely and had no chest pain. She uses O2 for chronic hypoxemia and is on 3-4L/min. Despite this she was still very weak and could not ventilate.  ? ?10/31/21- patietn respiratory status is improved but she had AF episode again and cardiology consultation is ordered. I stopped nebulzier duoneb to reduce risk of AF episodes.  She diuresed well and no longer requires diuretics , I have dcd torsemide and aldactone. Patient is doing better can be Dcd home once cleared from cardio perspective ? ?11/01/21- patient seen andevaluated at bedside , she reports improved breathing. She appears depressed and anxious.  She asked me to close door so we can talk but then would not share with me whats bothering her. She stated the anxiety started this moring.  She denies alcoholism and denies withdrawal.  [facility] her nerves are acting up.  She is contracted and should not have more Torsemide because her CO2 levels will continue to increase ? ? ?11/02/21- patient shares shes is in a good mood today. She is breathing better. She is getting close to baseline now and may be discharged soon. She is coughing and expectorating dark phlegm inspissated mucus.  ? ? ?PAST MEDICAL HISTORY  ? ?Past Medical History:  ?Diagnosis Date  ? CHF (congestive heart failure) (HCC)   ? COPD  (chronic obstructive pulmonary disease) (HCC)   ? Coronary artery disease   ? Hypertension   ? ? ? ?SURGICAL HISTORY  ? ?Past Surgical History:  ?Procedure Laterality Date  ? ABDOMINAL HYSTERECTOMY    ? ? ? ?FAMILY HISTORY  ? ?Family History  ?Problem Relation Age of Onset  ? Arrhythmia Mother   ? Hypertension Mother   ? Heart disease Mother   ? Stomach cancer Father   ? Lupus Sister   ? ? ? ?SOCIAL HISTORY  ? ?Social History  ? ?Tobacco Use  ? Smoking status: Former  ?  Packs/day: 0.75  ?  Years: 44.00  ?  Pack years: 33.00  ?  Types: Cigarettes  ? Smokeless tobacco: Never  ?Vaping Use  ? Vaping Use: Never used  ?Substance Use Topics  ? Alcohol use: Never  ? Drug use: Never  ? ? ? ?MEDICATIONS  ? ? ?Home Medication:  ?  ?Current Medication: ? ?Current Facility-Administered Medications:  ?  acetaminophen (TYLENOL) tablet 650 mg, 650 mg, Oral, Q6H PRN **OR** acetaminophen (TYLENOL) suppository 650 mg, 650 mg, Rectal, Q6H PRN, Mansy, Jan A, MD ?  albuterol (PROVENTIL) (2.5 MG/3ML) 0.083% nebulizer solution 2.5 mg, 2.5 mg, Nebulization, Daily, Karna ChristmasAleskerov, Zema Lizardo, MD, 2.5 mg at 11/02/21 1040 ?  ALPRAZolam (XANAX) tablet 0.5 mg, 0.5 mg, Oral, BID, Vida RiggerAleskerov, Daisee Centner, MD,  0.5 mg at 11/02/21 4081 ?  amiodarone (NEXTERONE PREMIX) 360-4.14 MG/200ML-% (1.8 mg/mL) IV infusion, 30 mg/hr, Intravenous, Continuous, Furth, Cadence H, PA-C, Last Rate: 16.67 mL/hr at 11/02/21 1220, 30 mg/hr at 11/02/21 1220 ?  apixaban (ELIQUIS) tablet 5 mg, 5 mg, Oral, BID, Furth, Cadence H, PA-C, 5 mg at 11/02/21 4481 ?  chlorpheniramine-HYDROcodone 10-8 MG/5ML suspension 5 mL, 5 mL, Oral, Q12H PRN, Mansy, Jan A, MD ?  diltiazem (CARDIZEM CD) 24 hr capsule 180 mg, 180 mg, Oral, Daily, Agbor-Etang, Brian, MD, 180 mg at 11/02/21 8563 ?  empagliflozin (JARDIANCE) tablet 10 mg, 10 mg, Oral, QAC breakfast, Mansy, Jan A, MD, 10 mg at 11/02/21 1497 ?  guaiFENesin (MUCINEX) 12 hr tablet 600 mg, 600 mg, Oral, BID, Mansy, Jan A, MD, 600 mg at 11/02/21 0263 ?   magnesium hydroxide (MILK OF MAGNESIA) suspension 30 mL, 30 mL, Oral, Daily PRN, Mansy, Jan A, MD, 30 mL at 10/29/21 2102 ?  metoprolol tartrate (LOPRESSOR) injection 5 mg, 5 mg, Intravenous, Q5 min PRN, Furth, Cadence H, PA-C, 5 mg at 10/31/21 1355 ?  multivitamin with minerals tablet 1 tablet, 1 tablet, Oral, Daily, Mansy, Jan A, MD, 1 tablet at 11/02/21 7858 ?  ondansetron (ZOFRAN) tablet 4 mg, 4 mg, Oral, Q6H PRN **OR** ondansetron (ZOFRAN) injection 4 mg, 4 mg, Intravenous, Q6H PRN, Mansy, Jan A, MD, 4 mg at 10/29/21 0024 ?  tiotropium (SPIRIVA) inhalation capsule ([facility] use ONLY) 18 mcg, 18 mcg, Inhalation, Daily, Mansy, Jan A, MD, 18 mcg at 11/02/21 8502 ?  traZODone (DESYREL) tablet 25 mg, 25 mg, Oral, QHS PRN, Mansy, Jan A, MD, 25 mg at 10/31/21 2100 ? ? ? ?ALLERGIES  ? ?Patient has no known allergies. ? ? ? ? ?REVIEW OF SYSTEMS  ? ? ?Review of Systems: ? ?Gen:  Denies  fever, sweats, chills weigh loss  ?HEENT: Denies blurred vision, double vision, ear pain, eye pain, hearing loss, nose bleeds, sore throat ?Cardiac:  No dizziness, chest pain or heaviness, chest tightness,edema ?Resp:   reports dyspnea chronically  ?Gi: Denies swallowing difficulty, stomach pain, nausea or vomiting, diarrhea, constipation, bowel incontinence ?Gu:  Denies bladder incontinence, burning urine ?Ext:   Denies Joint pain, stiffness or swelling ?Skin: Denies  skin rash, easy bruising or bleeding or hives ?Endoc:  Denies polyuria, polydipsia , polyphagia or weight change ?Psych:   Denies depression, insomnia or hallucinations  ? ?Other:  All other systems negative ? ? ?VS: BP (!) 145/79 (BP Location: Left Arm)   Pulse 67   Temp 97.9 ?F (36.6 ?C)   Resp 18   Ht 5\' 7"  (1.702 m)   Wt 81.2 kg   SpO2 93%   BMI 28.04 kg/m?   ? ? ? ?PHYSICAL EXAM  ? ? ?GENERAL:NAD, no fevers, chills, no weakness no fatigue ?HEAD: Normocephalic, atraumatic.  ?EYES: Pupils equal, round, reactive to light. Extraocular muscles intact. No scleral icterus.   ?MOUTH: Moist mucosal membrane. Dentition intact. No abscess noted.  ?EAR, NOSE, THROAT: Clear without exudates. No external lesions.  ?NECK: Supple. No thyromegaly. No nodules. No JVD.  ?PULMONARY: decreased breath sounds with mild rhonchi worse at bases bilaterally.  ?CARDIOVASCULAR: S1 and S2. Regular rate and rhythm. No murmurs, rubs, or gallops. No edema. Pedal pulses 2+ bilaterally.  ?GASTROINTESTINAL: Soft, nontender, nondistended. No masses. Positive bowel sounds. No hepatosplenomegaly.  ?MUSCULOSKELETAL: No swelling, clubbing, or edema. Range of motion full in all extremities.  ?NEUROLOGIC: Cranial nerves II through XII are intact. No gross focal neurological  deficits. Sensation intact. Reflexes intact.  ?SKIN: No ulceration, lesions, rashes, or cyanosis. Skin warm and dry. Turgor intact.  ?PSYCHIATRIC: Mood, affect within normal limits. The patient is awake, alert and oriented x 3. Insight, judgment intact.  ? ? ?  ? ?IMAGING  ? ? ? ?ASSESSMENT/PLAN  ? ?Acute on chronic hypoxemic respiratory failure ?   Due to pneumonia worse on right with mild pleural effusion.  ?   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   - she had mild elevation of BNP ?    - repeat TTE ?    RVP + for parainfluenza 3 ? ?Moderate COPD exacerbation  ?    Agree with current antimicrobials and steroids via prednisone ? ? ? Bilateral pleural effusions ? I suspect this is CHF related . Will diurese for now and re-evaluate CXR ?-diuresed well overnight ? ? ?Bibasilar atelectasis ?   - CXR post Metaneb with albuterol   ?    PT/OT  ?    Incentive spirometry  ? ? ? ? ? ? ? ?Thank you for allowing me to participate in the care of this patient.  ? ?Patient/Family are satisfied with care plan and all questions have been answered.  ? ? ?Provider disclosure: ?Patient with at least one acute or chronic illness or injury that poses a threat to life or bodily function and is being managed actively during this encounter.  All of the below  services have been performed independently by signing provider:  review of prior documentation from internal and or external health records.  Review of previous and current lab results.  Interview and comprehensive asses

## 2021-11-02 NOTE — Plan of Care (Signed)
Plan of Care ? ?Pt transferred to the stepdown unit with worsening acute on chronic hypercapnic respiratory failure secondary to AECOPD and parainfluenza virus 3 requiring continuous Bipap.  RN reports the pt was previously stable on 4L O2 via nasal canula.  However, when she rounded on the pt she was tachypneic, cyanotic, hypoxic, and somnolent after she pulled her O2 off. Pt currently somnolent, but able to follow commands and states she is in the hospital.  Blood pressure stable map >65 and sbp >90.  Pt diminished throughout and mildly tachypneic.  Will continue Bipap for now and repeat ABG at 2000.  PCCM team will continue to monitor.   ? ?Virginia Norman, AGNP  ?Pulmonary/Critical Care ?Pager [phone] (please enter 7 digits) ?PCCM Consult Pager [phone] (please enter 7 digits)   ?

## 2021-11-02 NOTE — Progress Notes (Signed)
Per MD have icu evaluate patient. Rapid Response charge nurse Katie evaluated patient and agreed to have patient transferred to CCU stepdown. Dr. Lucianne Muss was made aware and ordered to transfer patient ?

## 2021-11-02 NOTE — Progress Notes (Signed)
?   11/02/21 1620  ?Assess: MEWS Score  ?Resp (!) 32  ?SpO2 (!) 60 %  ?O2 Device Room Air  ?Assess: MEWS Score  ?MEWS Temp 0  ?MEWS Systolic 0  ?MEWS Pulse 0  ?MEWS RR 2  ?MEWS LOC 0  ?MEWS Score 2  ?MEWS Score Color Yellow  ?Assess: if the MEWS score is Yellow or Red  ?Were vital signs taken at a resting state? Yes  ?Focused Assessment Change from prior assessment (see assessment flowsheet)  ?Does the patient meet 2 or more of the SIRS criteria? No  ?MEWS guidelines implemented *See Row Information* Yes  ?Treat  ?MEWS Interventions Escalated (See documentation below)  ?Take Vital Signs  ?Increase Vital Sign Frequency  Yellow: Q 2hr X 2 then Q 4hr X 2, if remains yellow, continue Q 4hrs  ?Escalate  ?MEWS: Escalate Yellow: discuss with charge nurse/RN and consider discussing with provider and RRT  ?Notify: Charge Nurse/RN  ?Name of Charge Nurse/RN Notified Mardella Layman  ?Date Charge Nurse/RN Notified 11/02/21  ?Time Charge Nurse/RN Notified 1620  ?Notify: Provider  ?Provider Name/Title kumar  ?Date Provider Notified 11/02/21  ?Time Provider Notified 1630  ?Notification Type Page  ?Notification Reason Change in status  ?Provider response See new orders  ?Date of Provider Response 11/02/21  ?Time of Provider Response 1635  ?Notify: Rapid Response  ?Name of Rapid Response RN Notified Katie  ?Date Rapid Response Notified 11/02/21  ?Time Rapid Response Notified 1620  ?Document  ?Patient Outcome Stabilized after interventions  ?Assess: SIRS CRITERIA  ?SIRS Temperature  0  ?SIRS Pulse 0  ?SIRS Respirations  1  ?SIRS WBC 0  ?SIRS Score Sum  1  ? ? ?

## 2021-11-02 NOTE — Progress Notes (Addendum)
Abg resulted with CO2 of 123. Dr. Lucianne Muss made aware, md ordered BIPAP. CXR ordered as well ?

## 2021-11-03 ENCOUNTER — Other Ambulatory Visit (HOSPITAL_COMMUNITY): Payer: Self-pay

## 2021-11-03 ENCOUNTER — Inpatient Hospital Stay: Payer: Self-pay

## 2021-11-03 LAB — BLOOD GAS, ARTERIAL
Acid-Base Excess: 31.1 mmol/L — ABNORMAL HIGH (ref 0.0–2.0)
Acid-Base Excess: 21.4 mmol/L — ABNORMAL HIGH (ref 0.0–2.0)
Acid-Base Excess: 23.4 mmol/L — ABNORMAL HIGH (ref 0.0–2.0)
Acid-Base Excess: 25.3 mmol/L — ABNORMAL HIGH (ref 0.0–2.0)
Bicarbonate: 65.9 mmol/L — ABNORMAL HIGH (ref 20.0–28.0)
Bicarbonate: 56.9 mmol/L — ABNORMAL HIGH (ref 20.0–28.0)
Bicarbonate: 56.9 mmol/L — ABNORMAL HIGH (ref 20.0–28.0)
Bicarbonate: 59.4 mmol/L — ABNORMAL HIGH (ref 20.0–28.0)
Delivery systems: POSITIVE
Delivery systems: POSITIVE
Delivery systems: POSITIVE
Delivery systems: POSITIVE
Delivery systems: POSITIVE
Expiratory PAP: 6 cmH2O
Expiratory PAP: 8 cmH2O
Expiratory PAP: 8 cmH2O
FIO2: 50 %
FIO2: 70 %
FIO2: 0.7 %
FIO2: 0.7 %
FIO2: 100 %
Inspiratory PAP: 16 cmH2O
MECHVT: 480 mL
O2 Saturation: 95.9 %
O2 Saturation: 96.8 %
O2 Saturation: 93.9 %
O2 Saturation: 95 %
O2 Saturation: 98.6 %
PEEP: 8 cmH2O
Patient temperature: 37
Patient temperature: 37
Patient temperature: 37
Patient temperature: 37
Patient temperature: 37
RATE: 20 resp/min
pCO2 arterial: >123 mmHg (ref 32–48)
pCO2 arterial: >150 mmHg (ref 32–48)
pCO2 arterial: >123 mmHg (ref 32–48)
pCO2 arterial: 121 mmHg (ref 32–48)
pCO2 arterial: 118 mmHg (ref 32–48)
pH, Arterial: 7.3 — ABNORMAL LOW (ref 7.35–7.45)
pH, Arterial: 7.21 — ABNORMAL LOW (ref 7.35–7.45)
pH, Arterial: 7.19 — CL (ref 7.35–7.45)
pH, Arterial: 7.28 — ABNORMAL LOW (ref 7.35–7.45)
pH, Arterial: 7.31 — ABNORMAL LOW (ref 7.35–7.45)
pO2, Arterial: 77 mmHg — ABNORMAL LOW (ref 83–108)
pO2, Arterial: 87 mmHg (ref 83–108)
pO2, Arterial: 74 mmHg — ABNORMAL LOW (ref 83–108)
pO2, Arterial: 74 mmHg — ABNORMAL LOW (ref 83–108)
pO2, Arterial: 195 mmHg — ABNORMAL HIGH (ref 83–108)

## 2021-11-03 LAB — CBC
HCT: 51.1 % — ABNORMAL HIGH (ref 36.0–46.0)
Hemoglobin: 15.3 g/dL — ABNORMAL HIGH (ref 12.0–15.0)
MCH: 28.7 pg (ref 26.0–34.0)
MCHC: 29.9 g/dL — ABNORMAL LOW (ref 30.0–36.0)
MCV: 95.7 fL (ref 80.0–100.0)
Platelets: 314 10*3/uL (ref 150–400)
RBC: 5.34 MIL/uL — ABNORMAL HIGH (ref 3.87–5.11)
RDW: 12.2 % (ref 11.5–15.5)
WBC: 13.4 10*3/uL — ABNORMAL HIGH (ref 4.0–10.5)
nRBC: 0 % (ref 0.0–0.2)

## 2021-11-03 LAB — BASIC METABOLIC PANEL
BUN: 29 mg/dL — ABNORMAL HIGH (ref 8–23)
BUN: 37 mg/dL — ABNORMAL HIGH (ref 8–23)
CO2: >45 mmol/L — ABNORMAL HIGH (ref 22–32)
CO2: >45 mmol/L — ABNORMAL HIGH (ref 22–32)
Calcium: 9 mg/dL (ref 8.9–10.3)
Calcium: 9.1 mg/dL (ref 8.9–10.3)
Chloride: 80 mmol/L — ABNORMAL LOW (ref 98–111)
Chloride: 78 mmol/L — ABNORMAL LOW (ref 98–111)
Creatinine, Ser: 0.58 mg/dL (ref 0.44–1.00)
Creatinine, Ser: 0.9 mg/dL (ref 0.44–1.00)
GFR, Estimated: >60 mL/min (ref >60)
GFR, Estimated: >60 mL/min (ref >60)
Glucose, Bld: 135 mg/dL — ABNORMAL HIGH (ref 70–99)
Glucose, Bld: 247 mg/dL — ABNORMAL HIGH (ref 70–99)
Potassium: 4.5 mmol/L (ref 3.5–5.1)
Potassium: 4.4 mmol/L (ref 3.5–5.1)
Sodium: 139 mmol/L (ref 135–145)
Sodium: 135 mmol/L (ref 135–145)

## 2021-11-03 LAB — MAGNESIUM: Magnesium: 2.3 mg/dL (ref 1.7–2.4)

## 2021-11-03 LAB — PHOSPHORUS: Phosphorus: 5 mg/dL — ABNORMAL HIGH (ref 2.5–4.6)

## 2021-11-03 MED ORDER — SODIUM CHLORIDE 0.9 % IV SOLN: INTRAVENOUS | Status: DC

## 2021-11-03 MED ORDER — ACETAZOLAMIDE SODIUM 500 MG IJ SOLR
250.0000 mg | Freq: Once | INTRAMUSCULAR | Status: AC
  Administered 2021-11-03: 250 mg via INTRAVENOUS
  Filled 2021-11-03: qty 250

## 2021-11-03 MED ORDER — HYDRALAZINE HCL 20 MG/ML IJ SOLN
10.0000 mg | INTRAMUSCULAR | Status: DC | PRN
  Administered 2021-11-03: 10 mg via INTRAVENOUS
  Filled 2021-11-03: qty 1

## 2021-11-03 MED ORDER — IPRATROPIUM-ALBUTEROL 0.5-2.5 (3) MG/3ML IN SOLN
3.0000 mL | RESPIRATORY_TRACT | Status: DC
  Administered 2021-11-03 – 2021-11-04 (×6): 3 mL via RESPIRATORY_TRACT
  Filled 2021-11-03 (×6): qty 3

## 2021-11-03 MED ORDER — ETOMIDATE 2 MG/ML IV SOLN
INTRAVENOUS | Status: AC
  Filled 2021-11-03: qty 10

## 2021-11-03 MED ORDER — SODIUM BICARBONATE 8.4 % IV SOLN
INTRAVENOUS | Status: AC
  Filled 2021-11-03: qty 50

## 2021-11-03 MED ORDER — ETOMIDATE 2 MG/ML IV SOLN: 20.0000 mg | Freq: Once | INTRAVENOUS | Status: DC

## 2021-11-03 MED ORDER — ROCURONIUM BROMIDE 10 MG/ML (PF) SYRINGE: 50.0000 mg | PREFILLED_SYRINGE | Freq: Once | INTRAVENOUS | Status: DC

## 2021-11-03 MED ORDER — FENTANYL CITRATE (PF) 100 MCG/2ML IJ SOLN
INTRAMUSCULAR | Status: AC
  Filled 2021-11-03: qty 2

## 2021-11-03 MED ORDER — SODIUM BICARBONATE 8.4 % IV SOLN
50.0000 meq | Freq: Once | INTRAVENOUS | Status: AC
  Administered 2021-11-03: 50 meq via INTRAVENOUS

## 2021-11-03 MED ORDER — DOCUSATE SODIUM 100 MG PO CAPS
100.0000 mg | ORAL_CAPSULE | Freq: Two times a day (BID) | ORAL | Status: DC
  Administered 2021-11-03 – 2021-11-04 (×2): 100 mg via ORAL
  Filled 2021-11-03 (×2): qty 1

## 2021-11-03 MED ORDER — SENNA 8.6 MG PO TABS
1.0000 | ORAL_TABLET | Freq: Every day | ORAL | Status: DC
  Administered 2021-11-04: 8.6 mg via ORAL
  Filled 2021-11-03: qty 1

## 2021-11-03 MED ORDER — ROCURONIUM BROMIDE 10 MG/ML (PF) SYRINGE
PREFILLED_SYRINGE | INTRAVENOUS | Status: AC
  Filled 2021-11-03: qty 10

## 2021-11-03 MED ORDER — FENTANYL CITRATE (PF) 100 MCG/2ML IJ SOLN: 100.0000 ug | Freq: Once | INTRAMUSCULAR | Status: DC

## 2021-11-03 MED ORDER — SODIUM CHLORIDE 0.9 % IV BOLUS
500.0000 mL | Freq: Once | INTRAVENOUS | Status: AC
  Administered 2021-11-03: 500 mL via INTRAVENOUS

## 2021-11-03 NOTE — Consult Note (Addendum)
? ?NAME:  Virginia Norman, MRN:  [id number], DOB:  11/10/1960, LOS: 6 ?ADMISSION DATE:  2021/11/18, CONSULTATION DATE:  11/03/2021 ?REFERRING MD:  Cipriano Bunker MD, CHIEF COMPLAINT:  Severe hypercapnia  ? ?History of Present Illness:  ?Is a 61 year old woman with a medical history of CHF with preserved ejection fraction, COPD and ongoing tobacco abuse, who was admitted on 29 October 2021 with acute on chronic respiratory failure with hypoxia and hypercarbia.  She has been managed in the progressive care unit.  She presented with a working diagnosis of decompensation CHF.  She received diuretics until last evening.  She was transferred to the intensive care unit due to need for noninvasive ventilation and encephalopathy likely due to her respiratory failure.  She was noted to have severe metabolic derangements to include very low chloride, very high bicarbonate and high PaCO2.  She has significant hemoconcentration on CBC.  We are asked to assist in her management. ? ?Pertinent  Medical History  ?COPD ?CHF with preserved EF ?CAD ?HTN ? ?Significant Hospital Events: ?Including procedures, antibiotic start and stop dates in addition to other pertinent events   ?11/02/2021 transferred to ICU due to severe hypercapnia and severe metabolic derangements ?11/03/2021 requiring AVAPS, stopped all diuretics, Diamox, sodium chloride infusion ? ?Interim History / Subjective:  ?Patient is lethargic to obtunded, currently on noninvasive ventilation, no further assessment possible. ? ?Objective   ?Blood pressure 94/60, pulse 78, temperature 97.6 ?F (36.4 ?C), temperature source Axillary, resp. rate (!) 22, height 5\' 7"  (1.702 m), weight 77.5 kg, SpO2 94 %. ?   ?FiO2 (%):  [50 %-60 %] 60 %  ? ?Intake/Output Summary (Last 24 hours) at 11/03/2021 2058 ?Last data filed at 11/03/2021 1800 ?Gross per 24 hour  ?Intake 2209.61 ml  ?Output 1600 ml  ?Net 609.61 ml  ? ?Filed Weights  ? Nov 18, 2021 1845 11/02/21 1847  ?Weight: 81.2 kg 77.5 kg   ? ? ?Examination: ?GENERAL: Lethargic/obtunded woman, on noninvasive ventilation, supine in bed. ?HEAD: Normocephalic, atraumatic.  ?EYES: Pupils equal, round, pinpoint.  No scleral icterus.  ?MOUTH: Very dry oral mucosa, no thrush ?NECK: Supple. No thyromegaly. Trachea midline. No JVD.  No adenopathy. ?PULMONARY: Good air entry bilaterally.  Coarse rhonchi, no wheezes.  CARDIOVASCULAR: S1 and S2. Regular rate and rhythm.  ?ABDOMEN: Soft, nondistended, normoactive bowel sounds ?MUSCULOSKELETAL: No joint deformity, no clubbing, no edema.  ?NEUROLOGIC: Lethargic/obtunded, cannot make any further assessment. ?SKIN: Intact,warm,dry. ?PSYCH: Cannot assess due to respiratory failure. ? ?Resolved Hospital Problem list   ?N/A ? ?Assessment & Plan:  ?Acute on chronic hypercarbic respiratory failure in the setting of AECOPD due to Parainfluenza 3 ?Continue noninvasive ventilation, switch to AVAPS ?Continue to monitor arterial blood gases ?Nebulization treatments with DuoNebs every 4h ? ?Chronic hypercarbic respiratory failure with severe hypercapnia in the setting of severe metabolic alkalosis ?Other factors: COPD, RB ILD, Severe kyphosis with restrictive physiology ?Baseline PaCO2 appears to be between 70-80 ?PaCO2 has worsened to well over 150 ?Severe metabolic alkalosis aggravating the above ?Continue AVAPS ?Diamox x1 ?Sodium chloride infusion ?Discontinue diuretics ?Follow renal panel and arterial blood gases ?Will likely need Trilogy ventilator at home for nighttime use ? ?Volume depletion/contraction ?Volume resuscitation with normal saline ?Patient is very hemo concentrated ?Avoid diuretics ? ?Paroxysmal atrial fibrillation ?Likely triggered by hypoxia and metabolic derangements ?Continue amiodarone ?Continue anticoagulation ? ?Toxic metabolic encephalopathy, acute ?Due to hypercapnia, metabolic derangements ?Continue noninvasive ventilation ?Monitor PaCO2 ?Volume resuscitated with normal saline ?Correct electrolytes  as able ? ? ? ? ?Best Practice (right  click and "Reselect all SmartList Selections" daily)  ? ?Diet/type: NPO ?DVT prophylaxis: DOAC ?GI prophylaxis: N/A ?Lines: N/A ?Foley:  Yes, and it is still needed ?Code Status:  full code ?Last date of multidisciplinary goals of care discussion [11/03/2021] discussed with patient's husband who would like for intubation if needed.  Made it clear to the patient's husband that her prognosis is exceedingly guarded.  Likelihood of her coming off the vent if intubated is slim due to severe underlying hypercapnia.  Patient would likely need trach and chronic ventilation in that event. ? ?Labs   ?CBC: ?Recent Labs  ?Lab 10/31/2021 ?1850 10/29/21 ?0552 11/02/21 ?[id number] 11/03/21 ?[id number]  ?WBC 10.9* 8.3 9.3 13.4*  ?NEUTROABS 9.4*  --   --   --   ?HGB 16.4* 15.6* 16.1* 15.3*  ?HCT 53.5* 51.3* 52.7* 51.1*  ?MCV 94.5 95.4 93.3 95.7  ?PLT 233 207 279 314  ? ? ?Basic Metabolic Panel: ?Recent Labs  ?Lab 10/19/2021 ?1850 10/29/21 ?0552 10/31/21 ?1259 11/02/21 ?[id number] 11/03/21 ?[id number] 11/03/21 ?1437  ?NA 139 137  --  137 139 135  ?K 5.0 4.6  --  4.1 4.5 4.4  ?CL 93* 92*  --  80* 80* 78*  ?CO2 36* 36*  --  >45* >45* >45*  ?GLUCOSE 121* 137*  --  137* 135* 247*  ?BUN 25* 31*  --  23 29* 37*  ?CREATININE 0.65 0.52  --  0.51 0.58 0.90  ?CALCIUM 9.0 8.8*  --  8.8* 9.0 9.1  ?MG  --   --  2.6* 2.8* 2.3  --   ?PHOS  --   --   --  6.0* 5.0*  --   ? ?GFR: ?Estimated Creatinine Clearance: 69.6 mL/min (by C-G formula based on SCr of 0.9 mg/dL). ?Recent Labs  ?Lab 10/30/2021 ?1850 10/29/21 ?0552 11/02/21 ?[id number] 11/03/21 ?[id number]  ?WBC 10.9* 8.3 9.3 13.4*  ? ? ?Liver Function Tests: ?No results for input(s): AST, ALT, ALKPHOS, BILITOT, PROT, ALBUMIN in the last 168 hours. ?No results for input(s): LIPASE, AMYLASE in the last 168 hours. ?No results for input(s): AMMONIA in the last 168 hours. ? ?ABG ?   ?Component Value Date/Time  ? PHART 7.28 (L) 11/03/2021 1650  ? PCO2ART 121 (HH) 11/03/2021 1650  ? PO2ART 74 (L) 11/03/2021  1650  ? HCO3 56.9 (H) 11/03/2021 1650  ? O2SAT 95 11/03/2021 1650  ?  ? ?Coagulation Profile: ?No results for input(s): INR, PROTIME in the last 168 hours. ? ?Cardiac Enzymes: ?No results for input(s): CKTOTAL, CKMB, CKMBINDEX, TROPONINI in the last 168 hours. ? ?HbA1C: ?Hgb A1c MFr Bld  ?Date/Time Value Ref Range Status  ?07/17/2021 06:23 AM 5.8 (H) 4.8 - 5.6 % Final  ?  Comment:  ?  (NOTE) ?Pre diabetes:          5.7%-6.4% ? ?Diabetes:              >6.4% ? ?Glycemic control for   <7.0% ?adults with diabetes ?  ?10/03/2019 04:45 AM 6.3 (H) 4.8 - 5.6 % Final  ?  Comment:  ?  (NOTE) ?Pre diabetes:          5.7%-6.4% ?Diabetes:              >6.4% ?Glycemic control for   <7.0% ?adults with diabetes ?  ? ? ?CBG: ?Recent Labs  ?Lab 11/02/21 ?1850  ?GLUCAP 155*  ? ? ?Review of Systems:   ?Unable to obtain due to patient's lethargic/obtunded status on BiPAP ? ?Past Medical History:  ?  She,  has a past medical history of CHF (congestive heart failure) (HCC), COPD (chronic obstructive pulmonary disease) (HCC), Coronary artery disease, and Hypertension.  ? ?Surgical History:  ? ?Past Surgical History:  ?Procedure Laterality Date  ? ABDOMINAL HYSTERECTOMY    ?  ? ?Social History:  ? reports that she has quit smoking. Her smoking use included cigarettes. She has a 33.00 pack-year smoking history. She has never used smokeless tobacco. She reports that she does not drink alcohol and does not use drugs.  ? ?Family History:  ?Her family history includes Arrhythmia in her mother; Heart disease in her mother; Hypertension in her mother; Lupus in her sister; Stomach cancer in her father.  ? ?Allergies ?No Known Allergies  ? ?Home Medications  ?Prior to Admission medications   ?Medication Sig Start Date End Date Taking? Authorizing Provider  ?albuterol (VENTOLIN HFA) 108 (90 Base) MCG/ACT inhaler Inhale 1-2 puffs into the lungs every 6 (six) hours as needed for wheezing or shortness of breath. 09/01/21  Yes Clarisa Kindred A, FNP  ?aspirin  EC 81 MG tablet Take 81 mg by mouth daily.   Yes [provider]  ?empagliflozin (JARDIANCE) 10 MG TABS tablet Take 1 tablet (10 mg total) by mouth daily before breakfast. 07/30/21  Yes Delma Freeze, FNP

## 2021-11-03 NOTE — Progress Notes (Signed)
PROGRESS NOTE    Virginia Norman  AVW:[id number] DOB: [birth year] DOA: 10/28/2021  PCP: [facility]   Brief Narrative:  This 61 years old female with PMH significant for CHF, COPD, coronary artery disease, hypertension presented to the ED with complaints of severe shortness of breath.  She was hypoxic SPO2 69% on room air, her oxygen fell off at night.  Patient also reports productive cough, she was found to have wheezing on exam.  ABG shows pH 7.2 with PCO2 of 98%.  Patient was started on BiPAP.  Patient was admitted for acute on chronic hypoxic and hypercarbic respiratory failure secondary to CO2 retention. Patient was started on steroids and nebulized bronchodilators.  Pulmonology consulted recommended to continue diuretics.  RVP positive for parainfluenza virus.  Antibiotic discontinued. Hospital course complicated by A-fib with RVR and hypotension.  Cardiology consulted, Patient started on amiodarone gtt. Now converted to sinus rhythm.  Patient also has hallucinations, removed oxygen.  She became lethargic, ABG showed PCO2 123, patient was transferred to stepdown.  Continue to remain tachypneic, Patient accepted into ICU.  Assessment & Plan:   Principal Problem:   Acute respiratory failure with hypoxia and hypercapnia (HCC) Active Problems:   COPD exacerbation (HCC)   Benign essential HTN   Chronic diastolic CHF (congestive heart failure) (HCC)   Paroxysmal atrial fibrillation (HCC)  Acute on chronic hypoxic and hypercarbic respiratory failure: Patient presented with SPO2 69% on room air requiring BiPAP on arrival. ABG shows pH 7.2 with PCO2 of 98% at admission. Continue BiPAP as needed and at night. Continue IV Solu-Medrol and nebulized bronchodilators. Continue supplemental oxygen and wean as tolerated. Pulmonology consulted recommended to continue diuretic therapy. RVP positive for parainfluenza.  Antibiotics discontinued. Patient was transferred to stepdown due  to worsening CO2 retention. Patient accepted by Dr. Jayme Cloud. Low threshold for intubation.  COPD exacerbation: Continue nebulized bronchodilators with DuoNeb. Continue with steroid therapy with IV Solu-Medrol. Patient was empirically started on IV antibiotics. RVP shows parainfluenza virus , antibiotic discontinued Continue mucolytic therapy.  Bilateral pleural effusion: Likely related to CHF.  Continued  IV diuresis. Repeat chest x-ray in the morning improved findings.   2D echo showed LVEF 50 to 55%.  Chronic diastolic CHF: BNP not significantly elevated. Discontinue diuretics as she has developed contraction alkaosis Renal functions are stable.  HCTZ discontinued.  Paroxysmal atrial fibrillation: Likely in the setting of bronchodilators, parainfluenza / COPD. Spontaneously converted to normal sinus rhythm without intervention. Continue Cardizem ER 180 mg daily, started on Eliquis 5 mg twice daily. Patient went into A-fib last with hypotension requiring amiodarone gtt. Now converted to sinus and maintaining sinus rhythm. Plan is to continue amiodarone until acute event resolves. Recommended Zio patch monitoring at discharge.  Hallucinations/delusions: > Resolved. Overnight patient had an episode of hallucinations, delusions. Could be due to CO2 retention.  Patient was found without oxygen. ABG shows pH 7.5 PCO2 79 Patient was given Haldol.  In the morning she appears much improved. She does not remember what happened last night.   Essential hypertension: Continue home blood pressure medications.  DVT prophylaxis: Lovenox Code Status: Full code Family Communication: No family at bedside Disposition Plan:  Status is: Inpatient Remains inpatient appropriate because: Admitted for acute on chronic hypoxic and hypercarbic respiratory failure secondary to possible COPD and CHF exacerbation requiring IV diuresis , IV Solu-Medrol and antibiotics, developed paroxysmal A-fib  requiring Cardizem, amiodarone gtt. Maintaining NSR.    Consultants:  Pulmonology Cardiology  Procedures: Chest x-ray  Antimicrobials: Anti-infectives (  From admission, onward)    Start     Dose/Rate Route Frequency Ordered Stop   11-15-21 2345  cefTRIAXone (ROCEPHIN) 1 g in sodium chloride 0.9 % 100 mL IVPB  Status:  Discontinued        1 g 200 mL/hr over 30 Minutes Intravenous Every 24 hours 15-Nov-2021 2338 10/30/21 1512        Subjective: Patient was seen and examined at bedside.  Overnight events noted.   Patient reports not feeling well. Overnight she slept fine but remains exhausted.   Patient was tachypneic and lethargic,  ABG shows increased PCO2,  Patient transferred to ICU.  Objective: Vitals:   11/03/21 0958 11/03/21 1000 11/03/21 1005 11/03/21 1100  BP: (!) 153/78 (!) 178/91 (!) 162/79 (!) 166/84  Pulse:  81 77 85  Resp:  (!) 31 (!) 35 (!) 40  Temp:      TempSrc:      SpO2:  99% 99% (!) 88%  Weight:      Height:        Intake/Output Summary (Last 24 hours) at 11/03/2021 1205 Last data filed at 11/03/2021 0438 Gross per 24 hour  Intake 1542.5 ml  Output 1400 ml  Net 142.5 ml   Filed Weights   2021/11/15 1845 11/02/21 1847  Weight: 81.2 kg 77.5 kg    Examination:  General exam: Appears frail, deconditioned, exhausted, tachypneic. Respiratory system: Decreased breath sounds, no wheezing, no crackles, respiratory effort 18 Cardiovascular system: S1-S2 heard, Irregular  rhythm, no murmur. Gastrointestinal system: Abdomen is soft, non tender, non distended, BS+ Central nervous system: Alert and oriented x 3. No focal neurological deficits. Extremities: No edema, no cyanosis, no clubbing. Skin: No rashes, lesions or ulcers Psychiatry: Mood, judgment, insight normal.   Data Reviewed: I have personally reviewed following labs and imaging studies  CBC: Recent Labs  Lab 2021-11-15 1850 10/29/21 0552 11/02/21 0512 11/03/21 0339  WBC 10.9* 8.3 9.3 13.4*   NEUTROABS 9.4*  --   --   --   HGB 16.4* 15.6* 16.1* 15.3*  HCT 53.5* 51.3* 52.7* 51.1*  MCV 94.5 95.4 93.3 95.7  PLT 233 207 279 314   Basic Metabolic Panel: Recent Labs  Lab 15-Nov-2021 1850 10/29/21 0552 10/31/21 1259 11/02/21 0512 11/03/21 0339  NA 139 137  --  137 139  K 5.0 4.6  --  4.1 4.5  CL 93* 92*  --  80* 80*  CO2 36* 36*  --  >45* >45*  GLUCOSE 121* 137*  --  137* 135*  BUN 25* 31*  --  23 29*  CREATININE 0.65 0.52  --  0.51 0.58  CALCIUM 9.0 8.8*  --  8.8* 9.0  MG  --   --  2.6* 2.8* 2.3  PHOS  --   --   --  6.0* 5.0*   GFR: Estimated Creatinine Clearance: 78.3 mL/min (by C-G formula based on SCr of 0.58 mg/dL). Liver Function Tests: No results for input(s): AST, ALT, ALKPHOS, BILITOT, PROT, ALBUMIN in the last 168 hours. No results for input(s): LIPASE, AMYLASE in the last 168 hours. No results for input(s): AMMONIA in the last 168 hours. Coagulation Profile: No results for input(s): INR, PROTIME in the last 168 hours. Cardiac Enzymes: No results for input(s): CKTOTAL, CKMB, CKMBINDEX, TROPONINI in the last 168 hours. BNP (last 3 results) No results for input(s): PROBNP in the last 8760 hours. HbA1C: No results for input(s): HGBA1C in the last 72 hours. CBG: Recent Labs  Lab 11/02/21  1850  GLUCAP 155*   Lipid Profile: No results for input(s): CHOL, HDL, LDLCALC, TRIG, CHOLHDL, LDLDIRECT in the last 72 hours. Thyroid Function Tests: Recent Labs    10/31/21 1259  TSH 1.286   Anemia Panel: No results for input(s): VITAMINB12, FOLATE, FERRITIN, TIBC, IRON, RETICCTPCT in the last 72 hours. Sepsis Labs: No results for input(s): PROCALCITON, LATICACIDVEN in the last 168 hours.  Recent Results (from the past 240 hour(s))  Resp Panel by RT-PCR (Flu A&B, Covid) Nasopharyngeal Swab     Status: None   Collection Time: 10/28/21 11:29 PM   Specimen: Nasopharyngeal Swab; Nasopharyngeal(NP) swabs in vial transport medium  Result Value Ref Range Status    SARS Coronavirus 2 by RT PCR NEGATIVE NEGATIVE Final    Comment: (NOTE) SARS-CoV-2 target nucleic acids are NOT DETECTED.  The SARS-CoV-2 RNA is generally detectable in upper respiratory specimens during the acute phase of infection. The lowest concentration of SARS-CoV-2 viral copies this assay can detect is 138 copies/mL. A negative result does not preclude SARS-Cov-2 infection and should not be used as the sole basis for treatment or other patient management decisions. A negative result may occur with  improper specimen collection/handling, submission of specimen other than nasopharyngeal swab, presence of viral mutation(s) within the areas targeted by this assay, and inadequate number of viral copies(<138 copies/mL). A negative result must be combined with clinical observations, patient history, and epidemiological information. The expected result is Negative.  Fact Sheet for Patients:  BloggerCourse.com  Fact Sheet for Healthcare Providers:  SeriousBroker.it  This test is no t yet approved or cleared by the [place] FDA and  has been authorized for detection and/or diagnosis of SARS-CoV-2 by FDA under an Emergency Use Authorization (EUA). This EUA will remain  in effect (meaning this test can be used) for the duration of the COVID-19 declaration under Section 564(b)(1) of the Act, 21 U.S.C.section 360bbb-3(b)(1), unless the authorization is terminated  or revoked sooner.       Influenza A by PCR NEGATIVE NEGATIVE Final   Influenza B by PCR NEGATIVE NEGATIVE Final    Comment: (NOTE) The Xpert Xpress SARS-CoV-2/FLU/RSV plus assay is intended as an aid in the diagnosis of influenza from Nasopharyngeal swab specimens and should not be used as a sole basis for treatment. Nasal washings and aspirates are unacceptable for Xpert Xpress SARS-CoV-2/FLU/RSV testing.  Fact Sheet for  Patients: BloggerCourse.com  Fact Sheet for Healthcare Providers: SeriousBroker.it  This test is not yet approved or cleared by the [place] FDA and has been authorized for detection and/or diagnosis of SARS-CoV-2 by FDA under an Emergency Use Authorization (EUA). This EUA will remain in effect (meaning this test can be used) for the duration of the COVID-19 declaration under Section 564(b)(1) of the Act, 21 U.S.C. section 360bbb-3(b)(1), unless the authorization is terminated or revoked.  Performed at [facility], [address] [place], [place] [address]   Respiratory (~20 pathogens) panel by PCR     Status: Abnormal   Collection Time: 10/29/21  6:35 PM   Specimen: Nasopharyngeal Swab; Respiratory  Result Value Ref Range Status   Adenovirus NOT DETECTED NOT DETECTED Final   Coronavirus 229E NOT DETECTED NOT DETECTED Final    Comment: (NOTE) The Coronavirus on the Respiratory Panel, DOES NOT test for the novel  Coronavirus (2019 nCoV)    Coronavirus HKU1 NOT DETECTED NOT DETECTED Final   Coronavirus NL63 NOT DETECTED NOT DETECTED Final   Coronavirus OC43 NOT DETECTED NOT DETECTED Final  Metapneumovirus NOT DETECTED NOT DETECTED Final   Rhinovirus / Enterovirus NOT DETECTED NOT DETECTED Final   Influenza A NOT DETECTED NOT DETECTED Final   Influenza B NOT DETECTED NOT DETECTED Final   Parainfluenza Virus 1 NOT DETECTED NOT DETECTED Final   Parainfluenza Virus 2 NOT DETECTED NOT DETECTED Final   Parainfluenza Virus 3 DETECTED (A) NOT DETECTED Final   Parainfluenza Virus 4 NOT DETECTED NOT DETECTED Final   Respiratory Syncytial Virus NOT DETECTED NOT DETECTED Final   Bordetella pertussis NOT DETECTED NOT DETECTED Final   Bordetella Parapertussis NOT DETECTED NOT DETECTED Final   Chlamydophila pneumoniae NOT DETECTED NOT DETECTED Final   Mycoplasma pneumoniae NOT DETECTED NOT DETECTED Final    Comment:  Performed at [facility], [address], [place], [place] [address]  MRSA Next Gen by PCR, Nasal     Status: None   Collection Time: 11/02/21  6:50 PM   Specimen: Nasal Mucosa; Nasal Swab  Result Value Ref Range Status   MRSA by PCR Next Gen NOT DETECTED NOT DETECTED Final    Comment: (NOTE) The GeneXpert MRSA Assay (FDA approved for NASAL specimens only), is one component of a comprehensive MRSA colonization surveillance program. It is not intended to diagnose MRSA infection nor to guide or monitor treatment for MRSA infections. Test performance is not FDA approved in patients less than 33 years old. Performed at [facility], [address] [place], [place] [address]     Radiology Studies: [facility] Chest [place] 1 View  Result Date: 11/03/2021 CLINICAL DATA:  Provided history: Acute respiratory failure. CHF. CAD. COPD. EXAM: PORTABLE CHEST 1 VIEW COMPARISON:  Prior chest radiographs 11/02/2021 and earlier. FINDINGS: Heart size at upper limits of normal. Central pulmonary vascular congestion. Prominence of the interstitial lung markings throughout both lungs. Mild linear atelectasis within the lateral left lung base. No evidence of pleural effusion or pneumothorax. No acute bony abnormality identified. IMPRESSION: Central pulmonary vascular congestion. Prominence of the interstitial lung markings throughout both lungs, which may reflect interstitial edema. However, atypical/viral infection may have a similar radiographic appearance and clinical correlation is recommended. Mild linear atelectasis within the left lung base. Aortic Atherosclerosis (ICD10-I70.0). Electronically Signed   By: Jackey Loge D.O.   On: 11/03/2021 11:17   DG Chest Port 1 View  Result Date: 11/02/2021 CLINICAL DATA:  Increased shortness of breath EXAM: PORTABLE CHEST 1 VIEW COMPARISON:  10/28/2021 FINDINGS: Cardiac size is within normal limits. There are no signs of alveolar pulmonary edema or focal pulmonary  consolidation. There is no pleural effusion or pneumothorax. IMPRESSION: There are no signs of pulmonary edema or focal pulmonary consolidation. Electronically Signed   By: Ernie Avena M.D.   On: 11/02/2021 17:48    Scheduled Meds:  albuterol  2.5 mg Nebulization Daily   ALPRAZolam  0.5 mg Oral BID   apixaban  5 mg Oral BID   chlorhexidine  15 mL Mouth Rinse BID   Chlorhexidine Gluconate Cloth  6 each Topical Daily   diltiazem  180 mg Oral Daily   docusate sodium  100 mg Oral BID   empagliflozin  10 mg Oral QAC breakfast   guaiFENesin  600 mg Oral BID   mouth rinse  15 mL Mouth Rinse q12n4p   multivitamin with minerals  1 tablet Oral Daily   senna  1 tablet Oral Daily   tiotropium  18 mcg Inhalation Daily   Continuous Infusions:  sodium chloride 100 mL/hr at 11/03/21 0831  amiodarone 30 mg/hr (11/03/21 0435)      LOS: 6 days    Time spent: 50 mins    Oseph Imburgia, MD [facility]   If 7PM-7AM, please contact night-coverage

## 2021-11-03 NOTE — Progress Notes (Signed)
? ? ?Progress Note ? ?Patient Name: Virginia Norman ?Date of Encounter: 11/03/2021 ? ?Primary Cardiologist: New to [facility] - consult by [facility] ? ?Subjective  ? ?Transferred to the ICU overnight with worsening respiratory status requiring BiPAP. No chest pain or palpitations. Dyspnea improved this morning. Off BiPAP, on supplemental oxygen via nasal cannula at 4 L.  ? ?Inpatient Medications  ?  ?Scheduled Meds: ? albuterol  2.5 mg Nebulization Daily  ? ALPRAZolam  0.5 mg Oral BID  ? apixaban  5 mg Oral BID  ? chlorhexidine  15 mL Mouth Rinse BID  ? Chlorhexidine Gluconate Cloth  6 each Topical Daily  ? diltiazem  180 mg Oral Daily  ? empagliflozin  10 mg Oral QAC breakfast  ? guaiFENesin  600 mg Oral BID  ? mouth rinse  15 mL Mouth Rinse q12n4p  ? multivitamin with minerals  1 tablet Oral Daily  ? tiotropium  18 mcg Inhalation Daily  ? ?Continuous Infusions: ? sodium chloride    ? amiodarone 30 mg/hr (11/03/21 0435)  ? ?PRN Meds: ?acetaminophen **OR** acetaminophen, magnesium hydroxide, metoprolol tartrate, ondansetron **OR** ondansetron (ZOFRAN) IV  ? ?Vital Signs  ?  ?Vitals:  ? 11/03/21 0400 11/03/21 0600 11/03/21 0700 11/03/21 0749  ?BP: 105/66 102/60 128/77   ?Pulse: 66 66 67 67  ?Resp: (!) 22 19 (!) 22 19  ?Temp:    98.5 ?F (36.9 ?C)  ?TempSrc:    Axillary  ?SpO2: 96% 97% 97% 96%  ?Weight:      ?Height:      ? ? ?Intake/Output Summary (Last 24 hours) at 11/03/2021 0831 ?Last data filed at 11/03/2021 0438 ?Gross per 24 hour  ?Intake 1542.5 ml  ?Output 1400 ml  ?Net 142.5 ml  ? ?Filed Weights  ? 10/16/2021 1845 11/02/21 1847  ?Weight: 81.2 kg 77.5 kg  ? ? ?Telemetry  ?  ?SR with heart rates in the 60s to 70s bpm - Personally Reviewed ? ?ECG  ?  ?No new tracings - Personally Reviewed ? ?Physical Exam  ? ?GEN: No acute distress.   ?Neck: No JVD. ?Cardiac: RRR, II/VI systolic murmur LLSB, no rubs, or gallops.  ?Respiratory: Diminished and coarse breath sounds bilaterally.  ?GI: Soft, nontender, non-distended.   ?MS: No edema;  No deformity. ?Neuro:  Alert and oriented x 3; Nonfocal.  ?Psych: Normal affect. ? ?Labs  ?  ?Chemistry ?Recent Labs  ?Lab 10/29/21 ?[id number] 11/02/21 ?[id number] 11/03/21 ?0339  ?NA 137 137 139  ?K 4.6 4.1 4.5  ?CL 92* 80* 80*  ?CO2 36* >45* >45*  ?GLUCOSE 137* 137* 135*  ?BUN 31* 23 29*  ?CREATININE 0.52 0.51 0.58  ?CALCIUM 8.8* 8.8* 9.0  ?GFRNONAA >60 >60 >60  ?ANIONGAP 9 NOT CALCULATED NOT CALCULATED  ?  ? ?Hematology ?Recent Labs  ?Lab 10/29/21 ?[id number] 11/02/21 ?[id number] 11/03/21 ?0339  ?WBC 8.3 9.3 13.4*  ?RBC 5.38* 5.65* 5.34*  ?HGB 15.6* 16.1* 15.3*  ?HCT 51.3* 52.7* 51.1*  ?MCV 95.4 93.3 95.7  ?MCH 29.0 28.5 28.7  ?MCHC 30.4 30.6 29.9*  ?RDW 13.2 12.7 12.2  ?PLT 207 279 314  ? ? ?Cardiac EnzymesNo results for input(s): TROPONINI in the last 168 hours. No results for input(s): TROPIPOC in the last 168 hours.  ? ?BNP ?Recent Labs  ?Lab 10/18/2021 ?1904  ?BNP 129.0*  ?  ? ?DDimer No results for input(s): DDIMER in the last 168 hours.  ? ?Radiology  ?  ?DG Chest Port 1 View ? ?Result Date: 11/02/2021 ?IMPRESSION: There are no  signs of pulmonary edema or focal pulmonary consolidation. Electronically Signed   By: Elmer Picker M.D.   On: 11/02/2021 17:48   ? ?Cardiac Studies  ? ?2D echo 10/30/2021: ?1. Left ventricular ejection fraction, by estimation, is 50 to 55%. The  ?left ventricle has low normal function. The left ventricle has no regional  ?wall motion abnormalities. Left ventricular diastolic parameters are  ?consistent with Grade I diastolic  ?dysfunction (impaired relaxation).  ? 2. Right ventricular systolic function is normal. The right ventricular  ?size is normal.  ? 3. The mitral valve is normal in structure. Trivial mitral valve  ?regurgitation. No evidence of mitral stenosis.  ? 4. The aortic valve is normal in structure. Aortic valve regurgitation is  ?not visualized. Aortic valve sclerosis is present, with no evidence of  ?aortic valve stenosis.  ? 5. The inferior vena cava is normal in size with greater  than 50%  ?respiratory variability, suggesting right atrial pressure of 3 mmHg. ? ?Patient Profile  ?   ?61 y.o. female with history of HFpEF, COPD, and HTN admitted with acute on chronic hypoxic and hypercapnic respiratory failure secondary to COPD exacerbation and parainfluenza who we are seeing for Afib with RVR. ? ?Assessment & Plan  ?  ?1. New onset Afib with RVR: ?-Maintaining sinus rhythm since spontaneous conversion in the ED ?-Likely in the setting of her acute pulmonary illness ?-Cardizem 180 mg ?-IV amiodarone for now given high risk for recurrence of atrial arrhythmia, though this is not a good long term medication for her ?-CHADS2VASc at least 3 (CHF, HTN, sex category) ?-Eliquis 5 mg bid ?-TSH normal ?-Potassium and magnesium at goal ?-Consider transitioning albuterol to Xopenex ? ?2. HFpEF: ?-Appears grossly euvolemic ?-Diuretics on hold ? ?3. Acute on chronic hypoxic and hypercarbic respiratory failure: ?-Secondary to COPD exacerbation and parainfluenza ?-Per primary service  ? ? ?   ? ?For questions or updates, please contact [place] ?Please consult [email or web address] for contact info under Cardiology/STEMI. ?  ? ?Signed, ?Christell Faith, PA-C ?[facility] ?Pager: [phone] ?11/03/2021, 8:31 AM ? ?

## 2021-11-03 NOTE — Progress Notes (Signed)
? ?      CROSS COVER NOTE ? ?NAME: Virginia Norman ?MRN: [id number] ?DOB : 16-Jul-1960 ? ?Contacted by Respiratory Therapy with critical ABG results. pH 7.30 pCO2 134 pO2 77 Bicarbonate 65.9. Prior ABG was pH 7.36 pCO2 123 pO2 85 Bicarbonate 69.5. Patient is alert and oriented x3 and able to follow commands at bedside. Reached out to PCCM to evaluate for continued CO2 retention despite BiPAP. Dr Lucile Shutters with [facility] contacted by [facility] NP and recommends continued management with bipap and setting adjustment, does not think patient requires intubation at this time. See PCCM note. ? ? ?Neomia Glass MHA, MSN, FNP-BC ?Nurse Practitioner ?[facility] ?[place] ?Pager [phone] ? ? ? ? ?

## 2021-11-03 NOTE — Progress Notes (Signed)
Patients 5 rings removed and given to patient's spouse.  ?

## 2021-11-03 NOTE — Care Plan (Signed)
Patient is under ICU care, will sign off, will take over once stable and out of ICU.  ?

## 2021-11-03 NOTE — Progress Notes (Signed)
SUBJECTIVE:Notified by cross covering NP to eval patient for possible intubation due to persistent Hypercapnia on ABG result despite BiPAP intervention. Repeat ABG obtained and showed Arterial Blood Gas result:  pO2 77; pCO2 >123; pH 7.3;  HCO3 65.9, %O2 Sat 95.9. ? ?OBJECTIVE: On bedside exam, the heart rate 66 beats/minute, the blood pressure 105/66 mm Hg, the respiratory rate 22 breaths/minute, and the oxygen saturation 96% on. Patient was alert and oriented x3, and following commands appropriately. Patient requested sip of water and oral care. No evidence of acute respirator distress or increased work of breathing. ? ?ASSESSMENT & PLAN ?Patient transferred to the unit at the beginning of the shift with worsening acute on chronic hypercapnic respiratory failure secondary to AECOPD and parainfluenza virus 3 requiring continuous Bipap now with worsening hypercapnic state. ? ?Acute Hypoxic Hypercapnic Respiratory Failure secondary to AECOPD and parainfluenza virus ?-Supplemental O2 as needed to maintain O2 saturations 88 to 92% ?-BiPAP, wean as tolerated ?-High risk for intubation ?-Follow intermittent ABG and chest x-ray as needed ?-Scheduled and As needed bronchodilators ?-Discussed ABG results and patient's clinical status with Elink MD on call Dr. Warrick Parisian who reviewed results and agree with current therapy as patient appears to be compensating appropriately with no significant change in mental status. Will hold off intubation as patient is awake, alert and oriented x 3 and does not appear symptomatic. ? ? ?  ? [name], DNP, CCRN, FNP-C, AGACNP-BC ?Acute Care & Family Nurse Practitioner  ?[facility]  ?See Amion for personal pager ?PCCM on call pager [phone] until 7 am ? ? ? ?  ? ?

## 2021-11-03 DEATH — deceased

## 2021-11-04 ENCOUNTER — Inpatient Hospital Stay: Payer: Self-pay

## 2021-11-04 DIAGNOSIS — J9622 Acute and chronic respiratory failure with hypercapnia: Secondary | ICD-10-CM

## 2021-11-04 DIAGNOSIS — J9621 Acute and chronic respiratory failure with hypoxia: Secondary | ICD-10-CM

## 2021-11-04 DIAGNOSIS — R579 Shock, unspecified: Secondary | ICD-10-CM

## 2021-11-04 LAB — BLOOD GAS, ARTERIAL
Acid-Base Excess: 15.5 mmol/L — ABNORMAL HIGH (ref 0.0–2.0)
Acid-Base Excess: 18.9 mmol/L — ABNORMAL HIGH (ref 0.0–2.0)
Acid-Base Excess: 22.9 mmol/L — ABNORMAL HIGH (ref 0.0–2.0)
Bicarbonate: 45.3 mmol/L — ABNORMAL HIGH (ref 20.0–28.0)
Bicarbonate: 48.4 mmol/L — ABNORMAL HIGH (ref 20.0–28.0)
Bicarbonate: 52.5 mmol/L — ABNORMAL HIGH (ref 20.0–28.0)
FIO2: 50 %
FIO2: 45 %
MECHVT: 450 mL
MECHVT: 450 mL
MECHVT: 450 mL
O2 Saturation: 94.4 %
O2 Saturation: 95.5 %
O2 Saturation: 97.6 %
PEEP: 8 cmH2O
PEEP: 8 cmH2O
PEEP: 8 cmH2O
Patient temperature: 37
Patient temperature: 37
Patient temperature: 37
RATE: 20 resp/min
RATE: 20 resp/min
RATE: 20 resp/min
pCO2 arterial: 82 mmHg (ref 32–48)
pCO2 arterial: 80 mmHg (ref 32–48)
pCO2 arterial: 81 mmHg (ref 32–48)
pH, Arterial: 7.35 (ref 7.35–7.45)
pH, Arterial: 7.39 (ref 7.35–7.45)
pH, Arterial: 7.42 (ref 7.35–7.45)
pO2, Arterial: 65 mmHg — ABNORMAL LOW (ref 83–108)
pO2, Arterial: 72 mmHg — ABNORMAL LOW (ref 83–108)
pO2, Arterial: 84 mmHg (ref 83–108)

## 2021-11-04 LAB — CBC WITH DIFFERENTIAL/PLATELET
Abs Immature Granulocytes: 0.15 10*3/uL — ABNORMAL HIGH (ref 0.00–0.07)
Basophils Absolute: 0 10*3/uL (ref 0.0–0.1)
Basophils Relative: 0 %
Eosinophils Absolute: 0 10*3/uL (ref 0.0–0.5)
Eosinophils Relative: 0 %
HCT: 45.1 % (ref 36.0–46.0)
Hemoglobin: 13.6 g/dL (ref 12.0–15.0)
Immature Granulocytes: 1 %
Lymphocytes Relative: 7 %
Lymphs Abs: 1.4 10*3/uL (ref 0.7–4.0)
MCH: 29.1 pg (ref 26.0–34.0)
MCHC: 30.2 g/dL (ref 30.0–36.0)
MCV: 96.6 fL (ref 80.0–100.0)
Monocytes Absolute: 2 10*3/uL — ABNORMAL HIGH (ref 0.1–1.0)
Monocytes Relative: 9 %
Neutro Abs: 18 10*3/uL — ABNORMAL HIGH (ref 1.7–7.7)
Neutrophils Relative %: 83 %
Platelets: 300 10*3/uL (ref 150–400)
RBC: 4.67 MIL/uL (ref 3.87–5.11)
RDW: 12.8 % (ref 11.5–15.5)
WBC: 21.5 10*3/uL — ABNORMAL HIGH (ref 4.0–10.5)
nRBC: 0 % (ref 0.0–0.2)

## 2021-11-04 LAB — RENAL FUNCTION PANEL
Albumin: 3 g/dL — ABNORMAL LOW (ref 3.5–5.0)
Anion gap: 6 (ref 5–15)
BUN: 43 mg/dL — ABNORMAL HIGH (ref 8–23)
CO2: 43 mmol/L — ABNORMAL HIGH (ref 22–32)
Calcium: 8.1 mg/dL — ABNORMAL LOW (ref 8.9–10.3)
Chloride: 90 mmol/L — ABNORMAL LOW (ref 98–111)
Creatinine, Ser: 1.04 mg/dL — ABNORMAL HIGH (ref 0.44–1.00)
GFR, Estimated: >60 mL/min (ref >60)
Glucose, Bld: 158 mg/dL — ABNORMAL HIGH (ref 70–99)
Phosphorus: 2.4 mg/dL — ABNORMAL LOW (ref 2.5–4.6)
Potassium: 4.5 mmol/L (ref 3.5–5.1)
Sodium: 139 mmol/L (ref 135–145)

## 2021-11-04 LAB — MAGNESIUM: Magnesium: 2.5 mg/dL — ABNORMAL HIGH (ref 1.7–2.4)

## 2021-11-04 LAB — TSH: TSH: 0.851 u[IU]/mL (ref 0.350–4.500)

## 2021-11-04 LAB — T4, FREE: Free T4: 0.57 ng/dL — ABNORMAL LOW (ref 0.61–1.12)

## 2021-11-04 MED ORDER — PROPOFOL 1000 MG/100ML IV EMUL
INTRAVENOUS | Status: AC
  Administered 2021-11-04: 10 ug/kg/min
  Filled 2021-11-04: qty 100

## 2021-11-04 MED ORDER — ACETAZOLAMIDE SODIUM 500 MG IJ SOLR
250.0000 mg | Freq: Once | INTRAMUSCULAR | Status: AC
  Administered 2021-11-04: 250 mg via INTRAVENOUS
  Filled 2021-11-04: qty 250

## 2021-11-04 MED ORDER — DOCUSATE SODIUM 50 MG/5ML PO LIQD
100.0000 mg | Freq: Two times a day (BID) | ORAL | Status: DC
  Administered 2021-11-04 – 2021-11-11 (×15): 100 mg
  Filled 2021-11-04 (×15): qty 10

## 2021-11-04 MED ORDER — SENNA 8.6 MG PO TABS
1.0000 | ORAL_TABLET | Freq: Every day | ORAL | Status: DC
  Administered 2021-11-05 – 2021-11-06 (×2): 8.6 mg
  Filled 2021-11-04 (×2): qty 1

## 2021-11-04 MED ORDER — VANCOMYCIN VARIABLE DOSE PER UNSTABLE RENAL FUNCTION (PHARMACIST DOSING): Status: DC

## 2021-11-04 MED ORDER — ORAL CARE MOUTH RINSE
15.0000 mL | OROMUCOSAL | Status: DC
  Administered 2021-11-04 – 2021-11-12 (×82): 15 mL via OROMUCOSAL

## 2021-11-04 MED ORDER — IPRATROPIUM-ALBUTEROL 0.5-2.5 (3) MG/3ML IN SOLN
3.0000 mL | Freq: Four times a day (QID) | RESPIRATORY_TRACT | Status: DC
  Administered 2021-11-04 – 2021-11-12 (×29): 3 mL via RESPIRATORY_TRACT
  Filled 2021-11-04 (×30): qty 3

## 2021-11-04 MED ORDER — FENTANYL 2500MCG IN NS 250ML (10MCG/ML) PREMIX INFUSION
INTRAVENOUS | Status: AC
  Filled 2021-11-04: qty 250

## 2021-11-04 MED ORDER — VITAL AF 1.2 CAL PO LIQD
1000.0000 mL | ORAL | Status: DC
  Administered 2021-11-04: 1000 mL

## 2021-11-04 MED ORDER — VANCOMYCIN HCL 1750 MG/350ML IV SOLN
1750.0000 mg | Freq: Once | INTRAVENOUS | Status: AC
  Administered 2021-11-04: 1750 mg via INTRAVENOUS
  Filled 2021-11-04: qty 350

## 2021-11-04 MED ORDER — NOREPINEPHRINE 4 MG/250ML-% IV SOLN
0.0000 ug/min | INTRAVENOUS | Status: DC
  Administered 2021-11-04: 35 ug/min via INTRAVENOUS
  Administered 2021-11-04: 22 ug/min via INTRAVENOUS
  Administered 2021-11-04: 24 ug/min via INTRAVENOUS
  Administered 2021-11-04: 34 ug/min via INTRAVENOUS
  Administered 2021-11-04: 24 ug/min via INTRAVENOUS
  Administered 2021-11-04: 12 ug/min via INTRAVENOUS
  Administered 2021-11-05: 19 ug/min via INTRAVENOUS
  Administered 2021-11-05: 20 ug/min via INTRAVENOUS
  Administered 2021-11-05 (×2): 15 ug/min via INTRAVENOUS
  Administered 2021-11-05: 19 ug/min via INTRAVENOUS
  Filled 2021-11-04 (×13): qty 250

## 2021-11-04 MED ORDER — VASOPRESSIN 20 UNITS/100 ML INFUSION FOR SHOCK
0.0000 [IU]/min | INTRAVENOUS | Status: DC
  Administered 2021-11-04 – 2021-11-05 (×3): 0.03 [IU]/min via INTRAVENOUS
  Administered 2021-11-05: 0.01 [IU]/min via INTRAVENOUS
  Administered 2021-11-06 – 2021-11-12 (×13): 0.03 [IU]/min via INTRAVENOUS
  Filled 2021-11-04 (×16): qty 100

## 2021-11-04 MED ORDER — MIDAZOLAM HCL 2 MG/2ML IJ SOLN
INTRAMUSCULAR | Status: AC
  Administered 2021-11-04: 2 mg
  Filled 2021-11-04: qty 2

## 2021-11-04 MED ORDER — PROPOFOL 1000 MG/100ML IV EMUL
0.0000 ug/kg/min | INTRAVENOUS | Status: DC
  Administered 2021-11-04: 15 ug/kg/min via INTRAVENOUS
  Administered 2021-11-04: 10 ug/kg/min via INTRAVENOUS
  Administered 2021-11-05: 15 ug/kg/min via INTRAVENOUS
  Administered 2021-11-06: 25 ug/kg/min via INTRAVENOUS
  Administered 2021-11-06: 15 ug/kg/min via INTRAVENOUS
  Administered 2021-11-06: 25 ug/kg/min via INTRAVENOUS
  Administered 2021-11-07 – 2021-11-08 (×3): 20 ug/kg/min via INTRAVENOUS
  Administered 2021-11-08: 15 ug/kg/min via INTRAVENOUS
  Administered 2021-11-08: 20 ug/kg/min via INTRAVENOUS
  Administered 2021-11-09 (×2): 25 ug/kg/min via INTRAVENOUS
  Administered 2021-11-09 – 2021-11-10 (×2): 40 ug/kg/min via INTRAVENOUS
  Administered 2021-11-10: 35 ug/kg/min via INTRAVENOUS
  Administered 2021-11-10: 40 ug/kg/min via INTRAVENOUS
  Administered 2021-11-10 – 2021-11-12 (×7): 35 ug/kg/min via INTRAVENOUS
  Filled 2021-11-04 (×26): qty 100

## 2021-11-04 MED ORDER — FREE WATER
30.0000 mL | Status: DC
Start: 2021-11-04 — End: 2021-11-12
  Administered 2021-11-04 – 2021-11-12 (×47): 30 mL

## 2021-11-04 MED ORDER — PANTOPRAZOLE 2 MG/ML SUSPENSION
40.0000 mg | Freq: Every day | ORAL | Status: DC
  Administered 2021-11-04 – 2021-11-12 (×9): 40 mg
  Filled 2021-11-04 (×9): qty 20

## 2021-11-04 MED ORDER — FENTANYL BOLUS VIA INFUSION
50.0000 ug | INTRAVENOUS | Status: DC | PRN
  Administered 2021-11-04 – 2021-11-06 (×2): 100 ug via INTRAVENOUS
  Administered 2021-11-07: 50 ug via INTRAVENOUS
  Filled 2021-11-04: qty 100

## 2021-11-04 MED ORDER — VANCOMYCIN HCL 1500 MG/300ML IV SOLN: 1500.0000 mg | INTRAVENOUS | Status: DC

## 2021-11-04 MED ORDER — NOREPINEPHRINE 4 MG/250ML-% IV SOLN
INTRAVENOUS | Status: AC
  Administered 2021-11-04: 10 ug/min
  Filled 2021-11-04: qty 250

## 2021-11-04 MED ORDER — CHLORHEXIDINE GLUCONATE 0.12% ORAL RINSE (MEDLINE KIT)
15.0000 mL | Freq: Two times a day (BID) | OROMUCOSAL | Status: DC
  Administered 2021-11-04 – 2021-11-12 (×17): 15 mL via OROMUCOSAL

## 2021-11-04 MED ORDER — FENTANYL 2500MCG IN NS 250ML (10MCG/ML) PREMIX INFUSION
50.0000 ug/h | INTRAVENOUS | Status: DC
  Administered 2021-11-04 (×3): 200 ug/h via INTRAVENOUS
  Administered 2021-11-05 – 2021-11-07 (×2): 100 ug/h via INTRAVENOUS
  Administered 2021-11-08: 75 ug/h via INTRAVENOUS
  Administered 2021-11-09 – 2021-11-10 (×2): 125 ug/h via INTRAVENOUS
  Administered 2021-11-10: 150 ug/h via INTRAVENOUS
  Administered 2021-11-11: 125 ug/h via INTRAVENOUS
  Filled 2021-11-04 (×9): qty 250

## 2021-11-04 MED ORDER — APIXABAN 5 MG PO TABS
5.0000 mg | ORAL_TABLET | Freq: Two times a day (BID) | ORAL | Status: DC
  Administered 2021-11-04 – 2021-11-10 (×12): 5 mg
  Filled 2021-11-04 (×12): qty 1

## 2021-11-04 MED ORDER — SODIUM CHLORIDE 0.9 % IV SOLN
2.0000 g | Freq: Three times a day (TID) | INTRAVENOUS | Status: DC
  Administered 2021-11-04 – 2021-11-06 (×6): 2 g via INTRAVENOUS
  Filled 2021-11-04: qty 12.5
  Filled 2021-11-04: qty 2
  Filled 2021-11-04 (×2): qty 12.5
  Filled 2021-11-04: qty 2
  Filled 2021-11-04: qty 12.5
  Filled 2021-11-04: qty 2
  Filled 2021-11-04: qty 12.5

## 2021-11-04 NOTE — Consult Note (Signed)
Pharmacy Antibiotic Note ? ?Virginia Norman is a 61 y.o. female with medical history including HFpEF, COPD, tobacco use disorder admitted on 10/17/2021 with acute on chronic respiratory failure. Admission complicated by severe metabolic derangements requiring transfer to ICU and ultimately intubation on 5/2. New onset of fevers and worsening leukocytosis on 5/2 and now there is concern for hospital acquired pneumonia. Pharmacy has been consulted for cefepime and vancomycin dosing. ? ?Plan: ? ?Cefepime 2 g IV q8h ? ?Vancomycin 1.75 g IV LD followed by maintenance regimen of vancomycin 1.5 g IV q24h ?--Calculated AUC: 541, Cmin: 12.6 ?--Daily Scr per protocol ?--Levels at steady state as clinically indicated ? ?Height: 5\' 7"  (170.2 cm) ?Weight: 77.5 kg (170 lb 13.7 oz) ?IBW/kg (Calculated) : 61.6 ? ?Temp (24hrs), Avg:100.2 ?F (37.9 ?C), Min:97.6 ?F (36.4 ?C), Max:100.9 ?F (38.3 ?C) ? ?Recent Labs  ?Lab 10/31/2021 ?1850 10/29/21 ?[id number] 11/02/21 ?[id number] 11/03/21 ?[id number] 11/03/21 ?1437 11/04/21 ?[id number]  ?WBC 10.9* 8.3 9.3 13.4*  --  21.5*  ?CREATININE 0.65 0.52 0.51 0.58 0.90 1.04*  ?  ?Estimated Creatinine Clearance: 60.2 mL/min (A) (by C-G formula based on SCr of 1.04 mg/dL (H)).   ? ?No Known Allergies ? ?Antimicrobials this admission: ?Ceftriaxone 4/26 x 1 ?Vancomycin 5/2 >>  ?Cefepime 5/2 >>  ? ?Dose adjustments this admission: ?N/A ? ?Microbiology results: ?4/26 Expanded viral panel: (+) Parainfluenza virus 3 ?4/30 MRSA PCR: (-) ?5/2 Tracheal aspirate: pending ? ?Thank you for allowing pharmacy to be a part of this patient?s care. ? ?Benita Gutter ?11/04/2021 11:13 AM ? ?

## 2021-11-04 NOTE — Progress Notes (Signed)
Addressed family after multiple instances of the patient's family appearing to be taking videos and pictures of staff in room. Family informed we do not give consent to being recorded. Family denied doing so, and acknowledged concern.  ?

## 2021-11-04 NOTE — Procedures (Signed)
Arterial Catheter Insertion Procedure Note ? ?Virginia Norman  ?[id number]  ?01-09-1961 ? ?Date:11/04/21  ?Time:6:22 AM  ? ?Provider Performing: Loraine Leriche  ? ?Procedure: Insertion of Arterial Line (17793) with US guidance (90300)  ? ?Indication(s) ?Blood pressure monitoring and/or need for frequent ABGs ? ?Consent ?Unable to obtain consent due to emergent nature of procedure. ? ?Anesthesia ?None ? ?Time Out ?Verified patient identification, verified procedure, site/side was marked, verified correct patient position, special equipment/implants available, medications/allergies/relevant history reviewed, required imaging and test results available. ? ?Sterile Technique ?Maximal sterile technique including full sterile barrier drape, hand hygiene, sterile gown, sterile gloves, mask, hair covering, sterile ultrasound probe cover (if used). ? ?Procedure Description ?Area of catheter insertion was cleaned with chlorhexidine and draped in sterile fashion. With real-time ultrasound guidance an arterial catheter was placed into the left radial artery.  Appropriate arterial tracings confirmed on monitor.   ? ?Complications/Tolerance ?None; patient tolerated the procedure well. ? ?EBL ?Minimal ? ?Specimen(s) ?None ? ? ?Virginia Silversmith, DNP, CCRN, FNP-C, AGACNP-BC ?Acute Care & Family Nurse Practitioner  ?[facility]  ?See Amion for personal pager ?PCCM on call pager [phone] until 7 am ? ? ?

## 2021-11-04 NOTE — Progress Notes (Signed)
Neuro change noted when giving patient night medications. Patient is more lethargic, weaker cough, and increased work of breathing that appears to be labored. Contacted APP in unit, to come to the bedside and assess patient. Discusses intubation and PCCMD contacted and case discussed and it was determined to continue treatment plan with patient. ?

## 2021-11-04 NOTE — Progress Notes (Signed)
Initial Nutrition Assessment ? ?DOCUMENTATION CODES:  ? ?Not applicable ? ?INTERVENTION:  ? ?Vital 1.2@65ml /hr- Initiate at trickle rate of 84ml/hr, once tolerating, increase by 36ml/hr q 8 hours until goal rate is reached.  ? ?Free water flushes 52ml q4 hours to maintain tube patency  ? ?Regimen provides 1872kcal/day, 117g/day protein and 144ml/day of free water.  ? ?Pt at high refeed risk; recommend monitor potassium, magnesium and phosphorus labs daily until stable ? ?NUTRITION DIAGNOSIS:  ? ?Inadequate oral intake related to inability to eat (pt sedated and ventilated) as evidenced by NPO status ? ?GOAL:  ? ?Provide needs based on ASPEN/SCCM guidelines ? ?MONITOR:  ? ?Vent status, Labs, Weight trends, TF tolerance, Skin, I & O's ? ?REASON FOR ASSESSMENT:  ? ?Ventilator ?  ? ?ASSESSMENT:  ? ?61 y/o female with h/o CAD, Afib, CHF, COPD and HTN who is admitted with COPD exacerbation, parainfluenza and Afib with RVR. ? ?Pt sedated and ventilated. OGT in place. Plan is to start trickle feeds today. Pt is likely at refeed risk. Per chart, pt is down 11lbs(6%) over the past two months; RD unsure how recently weight loss occurred.  ? ?Medications reviewed and include: colace, MVI, senokot, cefepime, levophed, propofol, vancomycin, vasopressin  ? ?Labs reviewed: Cl 90(L), BUN 43(H), creat 1.04(H), P 2.4(L), Mg 2.5(H) ?Wbc- 21.5(H) ? ?Patient is currently intubated on ventilator support ?MV: 10.7 L/min ?Temp (24hrs), Avg:100.6 ?F (38.1 ?C), Min:99.1 ?F (37.3 ?C), Max:100.9 ?F (38.3 ?C) ? ?Propofol: 6.98 ml/hr- provides 184kcal/day  ? ?MAP- >49mmHg  ? ?UOP-  ? ?NUTRITION - FOCUSED PHYSICAL EXAM: ? ?Flowsheet Row Most Recent Value  ?Orbital Region No depletion  ?Upper Arm Region No depletion  ?Thoracic and Lumbar Region No depletion  ?Buccal Region No depletion  ?Temple Region No depletion  ?Clavicle Bone Region No depletion  ?Clavicle and Acromion Bone Region No depletion  ?Scapular Bone Region No depletion   ?Dorsal Hand No depletion  ?Patellar Region No depletion  ?Anterior Thigh Region No depletion  ?Posterior Calf Region No depletion  ?Edema (RD Assessment) Mild  ?Hair Reviewed  ?Eyes Reviewed  ?Mouth Reviewed  ?Skin Reviewed  ?Nails Reviewed  ? ?Diet Order:   ?Diet Order   ? ?       ?  Diet Heart Room service appropriate? Yes; Fluid consistency: Thin  Diet effective now       ?  ? ?  ?  ? ?  ? ?EDUCATION NEEDS:  ? ?No education needs have been identified at this time ? ?Skin:  Skin Assessment: Reviewed RN Assessment ? ?Last BM:  4/27- type 5 ? ?Height:  ? ?Ht Readings from Last 1 Encounters:  ?10/29/2021 5\' 7"  (1.702 m)  ? ? ?Weight:  ? ?Wt Readings from Last 1 Encounters:  ?11/02/21 77.5 kg  ? ? ?Ideal Body Weight:  61.36 kg ? ?BMI:  Body mass index is 26.76 kg/m?. ? ?Estimated Nutritional Needs:  ? ?Kcal:  1831kcal/day ? ?Protein:  110-125g/day ? ?Fluid:  1.9-2.2L/day ? ?11/04/21 MS, RD, LDN ?Please refer to AMION for RD and/or RD on-call/weekend/after hours pager ? ?

## 2021-11-04 NOTE — Progress Notes (Signed)
Pt's score on the RT protocol assessment is 12. Because of this, the nebulizers are to be changed to Q6. ?

## 2021-11-04 NOTE — Progress Notes (Signed)
? ?NAME:  Virginia PigeonCindy Norman, MRN:  [id number], DOB:  [birth year], LOS: 7 ?ADMISSION DATE:  10/19/2021, CONSULTATION DATE:  11/03/2021 ?REFERRING MD:  Cipriano BunkerKumar,Pardeep MD, CHIEF COMPLAINT:  Severe hypercapnia  ? ?History of Present Illness:  ?Is a 61 year old woman with a medical history of CHF with preserved ejection fraction, COPD and ongoing tobacco abuse, who was admitted on 29 October 2021 with acute on chronic respiratory failure with hypoxia and hypercarbia.  She has been managed in the progressive care unit.  She presented with a working diagnosis of decompensation CHF.  She received diuretics until 4/30.  She was transferred to the intensive care unit due to need for noninvasive ventilation and encephalopathy likely due to her respiratory failure.  She was noted to have severe metabolic derangements to include very low chloride, very high bicarbonate and high PaCO2.  She has significant hemoconcentration on CBC.  We are asked to assist in her management. ? ?Pertinent  Medical History  ?COPD ?CHF with preserved EF ?CAD ?HTN ? ?Significant Hospital Events: ?Including procedures, antibiotic start and stop dates in addition to other pertinent events   ?11/02/2021 transferred to ICU due to severe hypercapnia and severe metabolic derangements ?11/03/2021 requiring AVAPS, stopped all diuretics, Diamox, sodium chloride infusion ?11/03/2021 patient required intubation in the evening CT head negative for acute CNS process ?11/04/2021 on mechanical ventilation ? ?Interim History / Subjective:  ?Intubated, mechanically ventilated.  PaCO2 now on 80 range appears to be BASELINE, given compensated pH. ? ?Objective   ?Blood pressure 105/60, pulse 85, temperature (!) 100.9 ?F (38.3 ?C), resp. rate 20, height 5\' 7"  (1.702 m), weight 77.5 kg, SpO2 91 %. ?CVP:  [13 mmHg] 13 mmHg  ?Vent Mode: PRVC ?FiO2 (%):  [40 %-60 %] 45 % ?Set Rate:  [20 bmp] 20 bmp ?Vt Set:  [450 mL] 450 mL ?PEEP:  [5 cmH20-8 cmH20] 8 cmH20 ?Plateau Pressure:  [24 cmH20]  24 cmH20  ? ?Intake/Output Summary (Last 24 hours) at 11/04/2021 1039 ?Last data filed at 11/03/2021 2221 ?Gross per 24 hour  ?Intake 1459.61 ml  ?Output 1225 ml  ?Net 234.61 ml  ? ? ?Filed Weights  ? 10/12/2021 1845 11/02/21 1847  ?Weight: 81.2 kg 77.5 kg  ? ? ?Examination: ?GENERAL: Intubated, mechanically ventilated acute on chronically ill-appearing woman, synchronous with the vent, lightly sedated. ?HEAD: Normocephalic, atraumatic.  ?EYES: Pupils equal, round, pinpoint.  No scleral icterus.  Mild periorbital edema ?MOUTH: Orotracheally intubated, OG in place. ?NECK: Supple. No thyromegaly. Trachea midline. No JVD.  No adenopathy. ?PULMONARY: Good air entry bilaterally.  Coarse rhonchi, no wheezes.   ?CARDIOVASCULAR: S1 and S2. Regular rate and rhythm.  No rubs, murmurs or gallops heard. ?ABDOMEN: Soft, nondistended, normoactive bowel sounds ?MUSCULOSKELETAL: No joint deformity, no clubbing, no edema.  ?NEUROLOGIC: Intubated, sedated, cannot make any further assessment. ?SKIN: Intact,warm,dry. ?PSYCH: Cannot assess due to respiratory failure. ? ?Resolved Hospital Problem list   ?N/A ? ?Assessment & Plan:  ?Acute on chronic hypercarbic respiratory failure in the setting of AECOPD due to Parainfluenza 3 ?Status post mucous plugging, ?Now intubated, mechanically ventilated ?Continue mechanical ventilation support ?VAP precautions/protocol ?SAT/SBT when able ?Allow for permissive hypercapnia, it appears patient's baseline is 80 PaCO2 ?Wean FiO2 as tolerated to keep saturations at 88% or better ?Continue to monitor arterial blood gases ?Continue nebulization treatments with DuoNebs every 4h ? ?Chronic hypercarbic respiratory failure with severe hypercapnia in the setting of severe metabolic (contraction) alkalosis ?Other factors: COPD, RB ILD, Severe kyphosis with restrictive physiology ?Baseline PaCO2 appears to  be at 80 ?PaCO2 worsened to well over 150 yesterday  ?Resuscitated with sodium chloride ?Severe contraction  alkalosis aggravating the above diuretics DC ?Required intubation overnight due to mucous plugging, worsening mental status ?Diamox x1 again today ?Sodium chloride infusion creased to 75 mL/h ?Follow renal panel and arterial blood gases ?At minimum will need Trilogy ventilator at home for nighttime use ?May not be able to wean off vent, may need trach/LTACH/home vent ? ?Volume depletion/contraction ?Volume resuscitation with normal saline ?Patient was very hemo concentrated (16.4/53.5) ?H&H today at baseline 13.6 and 45.1 ?Avoid diuretics ? ?Fever, leukocytosis ?Copious thick secretions/mucous plugs ?Covering for potential HCAP ?Culture tracheal secretions ?Empiric coverage with vancomycin, cefepime ? ?Paroxysmal atrial fibrillation ?Now NSR ?Likely triggered by hypoxia, hypercarbia and metabolic derangements ?Discontinued amiodarone and Cardizem today per cards ?Continue to monitor ?Continue anticoagulation ? ?Toxic metabolic encephalopathy, acute ?Due to hypercapnia, metabolic derangements ?Daily wake up assessments ?Monitor PaCO2 ?Volume resuscitated with normal saline ?Correct electrolytes as needed ? ? ? ? ?Best Practice (right click and "Reselect all SmartList Selections" daily)  ? ?Diet/type: NPO ?DVT prophylaxis: DOAC ?GI prophylaxis: N/A ?Lines: Central line and Arterial Line 5/02 ?Foley:  Yes, and it is still needed ?Code Status:  full code ?Last date of multidisciplinary goals of care discussion [11/03/2021] discussed with patient's husband who would like for intubation if needed.  Made it clear to the patient's husband that her prognosis is exceedingly guarded.  Likelihood of her coming off the vent if intubated is slim due to severe underlying hypercapnia.  Patient would likely need trach and chronic ventilation in that event. ? ?Labs   ?CBC: ?Recent Labs  ?Lab 10/15/2021 ?1850 10/29/21 ?0552 11/02/21 ?3614 11/03/21 ?4315 11/04/21 ?4008  ?WBC 10.9* 8.3 9.3 13.4* 21.5*  ?NEUTROABS 9.4*  --   --   --  18.0*   ?HGB 16.4* 15.6* 16.1* 15.3* 13.6  ?HCT 53.5* 51.3* 52.7* 51.1* 45.1  ?MCV 94.5 95.4 93.3 95.7 96.6  ?PLT 233 207 279 314 300  ? ? ? ?Basic Metabolic Panel: ?Recent Labs  ?Lab 10/29/21 ?0552 10/31/21 ?1259 11/02/21 ?6761 11/03/21 ?9509 11/03/21 ?1437 11/04/21 ?3267  ?NA 137  --  137 139 135 139  ?K 4.6  --  4.1 4.5 4.4 4.5  ?CL 92*  --  80* 80* 78* 90*  ?CO2 36*  --  >45* >45* >45* 43*  ?GLUCOSE 137*  --  137* 135* 247* 158*  ?BUN 31*  --  23 29* 37* 43*  ?CREATININE 0.52  --  0.51 0.58 0.90 1.04*  ?CALCIUM 8.8*  --  8.8* 9.0 9.1 8.1*  ?MG  --  2.6* 2.8* 2.3  --  2.5*  ?PHOS  --   --  6.0* 5.0*  --  2.4*  ? ? ?GFR: ?Estimated Creatinine Clearance: 60.2 mL/min (A) (by C-G formula based on SCr of 1.04 mg/dL (H)). ?Recent Labs  ?Lab 10/29/21 ?0552 11/02/21 ?1245 11/03/21 ?8099 11/04/21 ?8338  ?WBC 8.3 9.3 13.4* 21.5*  ? ? ? ?Liver Function Tests: ?Recent Labs  ?Lab 11/04/21 ?0611  ?ALBUMIN 3.0*  ? ?No results for input(s): LIPASE, AMYLASE in the last 168 hours. ?No results for input(s): AMMONIA in the last 168 hours. ? ?ABG ?   ?Component Value Date/Time  ? PHART 7.39 11/04/2021 0500  ? PCO2ART 80 (HH) 11/04/2021 0500  ? PO2ART 72 (L) 11/04/2021 0500  ? HCO3 48.4 (H) 11/04/2021 0500  ? O2SAT 95.5 11/04/2021 0500  ? ?  ? ?Coagulation Profile: ?No results for  input(s): INR, PROTIME in the last 168 hours. ? ?Cardiac Enzymes: ?No results for input(s): CKTOTAL, CKMB, CKMBINDEX, TROPONINI in the last 168 hours. ? ?HbA1C: ?Hgb A1c MFr Bld  ?Date/Time Value Ref Range Status  ?07/17/2021 06:23 AM 5.8 (H) 4.8 - 5.6 % Final  ?  Comment:  ?  (NOTE) ?Pre diabetes:          5.7%-6.4% ? ?Diabetes:              >6.4% ? ?Glycemic control for   <7.0% ?adults with diabetes ?  ?10/03/2019 04:45 AM 6.3 (H) 4.8 - 5.6 % Final  ?  Comment:  ?  (NOTE) ?Pre diabetes:          5.7%-6.4% ?Diabetes:              >6.4% ?Glycemic control for   <7.0% ?adults with diabetes ?  ? ? ?CBG: ?Recent Labs  ?Lab 11/02/21 ?1850  ?GLUCAP 155*  ? ? ? ?Review of  Systems:   ?Unable to obtain due to patient on mechanical ventilation and sedated ? ?Allergies ?No Known Allergies  ? ?Home Medications  ?Prior to Admission medications   ?Medication Sig Start Date End

## 2021-11-04 NOTE — Procedures (Signed)
Central Venous Catheter Insertion Procedure Note ? ?Virginia Norman  ?[id number]  ?1961/04/02 ? ?Date:11/04/21  ?Time:6:21 AM  ? ?Provider Performing:Joshawa Dubin A Jasdeep Kepner  ? ?Procedure: Insertion of Non-tunneled Central Venous Catheter(36556) with US guidance (15056)  ? ?Indication(s) ?Medication administration and Difficult access ? ?Consent ?Unable to obtain consent due to emergent nature of procedure. ? ?Anesthesia ?Topical only with 1% lidocaine  ? ?Timeout ?Verified patient identification, verified procedure, site/side was marked, verified correct patient position, special equipment/implants available, medications/allergies/relevant history reviewed, required imaging and test results available. ? ?Sterile Technique ?Maximal sterile technique including full sterile barrier drape, hand hygiene, sterile gown, sterile gloves, mask, hair covering, sterile ultrasound probe cover (if used). ? ?Procedure Description ?Area of catheter insertion was cleaned with chlorhexidine and draped in sterile fashion.  With real-time ultrasound guidance a central venous catheter was placed into the left internal jugular vein. Nonpulsatile blood flow and easy flushing noted in all ports.  The catheter was sutured in place and sterile dressing applied. ? ?Complications/Tolerance ?None; patient tolerated the procedure well. ?Chest X-ray is ordered to verify placement for internal jugular or subclavian cannulation.   Chest x-ray is not ordered for femoral cannulation. ? ?EBL ?Minimal ? ?Specimen(s) ?None ? ? ?Virginia Silversmith, DNP, CCRN, FNP-C, AGACNP-BC ?Acute Care & Family Nurse Practitioner  ?[facility]  ?See Amion for personal pager ?PCCM on call pager [phone] until 7 am ? ? ? ?

## 2021-11-04 NOTE — Progress Notes (Signed)
Patient intubated. 7.5 ett. CVC placed to [facility] Art line placed L radial.   ?

## 2021-11-04 NOTE — Care Plan (Signed)
Patient was intubated last night due to worsening respiratory distress.  Patient is under ICU care.  We will sign off. ?

## 2021-11-04 NOTE — Progress Notes (Signed)
? ? ?Progress Note ? ?Patient Name: Virginia Norman ?Date of Encounter: 11/04/2021 ? ?Primary Cardiologist: New to [facility] - consult by [facility] ? ?Subjective  ? ?Intubated and sedated overnight with worsening respiratory distress with hypercarbia and hypoxia. Requiring vasopressin and Levophed this morning. IV amiodarone held by overnight coverage. Maintaining sinus rhythm.  ? ?Inpatient Medications  ?  ?Scheduled Meds: ? apixaban  5 mg Oral BID  ? chlorhexidine gluconate (MEDLINE KIT)  15 mL Mouth Rinse BID  ? Chlorhexidine Gluconate Cloth  6 each Topical Daily  ? docusate sodium  100 mg Oral BID  ? guaiFENesin  600 mg Oral BID  ? ipratropium-albuterol  3 mL Nebulization Q4H  ? mouth rinse  15 mL Mouth Rinse 10 times per day  ? multivitamin with minerals  1 tablet Oral Daily  ? senna  1 tablet Oral Daily  ? sodium bicarbonate      ? ?Continuous Infusions: ? sodium chloride 125 mL/hr at 11/03/21 2221  ? fentaNYL infusion INTRAVENOUS 200 mcg/hr (11/04/21 0428)  ? norepinephrine (LEVOPHED) Adult infusion 35 mcg/min (11/04/21 0747)  ? propofol (DIPRIVAN) infusion 15 mcg/kg/min (11/04/21 0556)  ? vasopressin    ? ?PRN Meds: ?acetaminophen **OR** acetaminophen, fentaNYL, hydrALAZINE, magnesium hydroxide, metoprolol tartrate, ondansetron **OR** ondansetron (ZOFRAN) IV  ? ?Vital Signs  ?  ?Vitals:  ? 11/04/21 0430 11/04/21 0500 11/04/21 0530 11/04/21 0630  ?BP:      ?Pulse: 76 78 77 79  ?Resp: 20 20 20 20   ?Temp: (!) 100.6 ?F (38.1 ?C) (!) 100.4 ?F (38 ?C) (!) 100.6 ?F (38.1 ?C) (!) 100.8 ?F (38.2 ?C)  ?TempSrc:      ?SpO2: 94% 94% 94% 94%  ?Weight:      ?Height:      ? ? ?Intake/Output Summary (Last 24 hours) at 11/04/2021 0834 ?Last data filed at 11/03/2021 2221 ?Gross per 24 hour  ?Intake 1459.61 ml  ?Output 1225 ml  ?Net 234.61 ml  ? ?Filed Weights  ? 10/13/2021 1845 11/02/21 1847  ?Weight: 81.2 kg 77.5 kg  ? ? ?Telemetry  ?  ?SR 80s bpm - Personally Reviewed ? ?ECG  ?  ?No new tracings - Personally Reviewed ? ?Physical Exam   ? ?GEN: Ill appearing.   ?Neck: JVD difficult to assess secondary to respiratory support apparatus. ?Cardiac: RRR, no murmurs, rubs, or gallops.  ?Respiratory: Vented breath sounds bilaterally.  ?GI: Soft, nontender, non-distended.   ?MS: No edema; No deformity. ?Neuro:  Intubated and sedated.  ?Psych: Intubated and sedated. ? ?Labs  ?  ?Chemistry ?Recent Labs  ?Lab 11/03/21 ?4888 11/03/21 ?1437 11/04/21 ?9169  ?NA 139 135 139  ?K 4.5 4.4 4.5  ?CL 80* 78* 90*  ?CO2 >45* >45* 43*  ?GLUCOSE 135* 247* 158*  ?BUN 29* 37* 43*  ?CREATININE 0.58 0.90 1.04*  ?CALCIUM 9.0 9.1 8.1*  ?ALBUMIN  --   --  3.0*  ?GFRNONAA >60 >60 >60  ?ANIONGAP NOT CALCULATED NOT CALCULATED 6  ?  ? ?Hematology ?Recent Labs  ?Lab 11/02/21 ?4503 11/03/21 ?8882 11/04/21 ?8003  ?WBC 9.3 13.4* 21.5*  ?RBC 5.65* 5.34* 4.67  ?HGB 16.1* 15.3* 13.6  ?HCT 52.7* 51.1* 45.1  ?MCV 93.3 95.7 96.6  ?MCH 28.5 28.7 29.1  ?MCHC 30.6 29.9* 30.2  ?RDW 12.7 12.2 12.8  ?PLT 279 314 300  ? ? ?Cardiac EnzymesNo results for input(s): TROPONINI in the last 168 hours. No results for input(s): TROPIPOC in the last 168 hours.  ? ?BNP ?Recent Labs  ?Lab 11/02/2021 ?1904  ?  BNP 129.0*  ?  ? ?DDimer No results for input(s): DDIMER in the last 168 hours.  ? ?Radiology  ?  ?CT HEAD WO CONTRAST (5MM) ? ?Result Date: 11/03/2021 ?IMPRESSION: No acute intracranial abnormality. Electronically Signed   By: Ulyses Jarred M.D.   On: 11/03/2021 22:26  ? ?DG Chest Port 1 View ? ?Result Date: 11/04/2021 ?IMPRESSION: Tubes and lines in satisfactory position. Otherwise stable appearance of chest. Electronically Signed   By: Inez Catalina M.D.   On: 11/04/2021 02:40  ? ?DG Chest Port 1 View ? ?Result Date: 11/03/2021 ?IMPRESSION: Central pulmonary vascular congestion. Prominence of the interstitial lung markings throughout both lungs, which may reflect interstitial edema. However, atypical/viral infection may have a similar radiographic appearance and clinical correlation is recommended. Mild linear  atelectasis within the left lung base. Aortic Atherosclerosis (ICD10-I70.0). Electronically Signed   By: Kellie Simmering D.O.   On: 11/03/2021 11:17  ? ?DG Chest Port 1 View ? ?Result Date: 11/02/2021 ?IMPRESSION: There are no signs of pulmonary edema or focal pulmonary consolidation. Electronically Signed   By: Elmer Picker M.D.   On: 11/02/2021 17:48   ? ?Cardiac Studies  ? ?2D echo 10/30/2021: ?1. Left ventricular ejection fraction, by estimation, is 50 to 55%. The  ?left ventricle has low normal function. The left ventricle has no regional  ?wall motion abnormalities. Left ventricular diastolic parameters are  ?consistent with Grade I diastolic  ?dysfunction (impaired relaxation).  ? 2. Right ventricular systolic function is normal. The right ventricular  ?size is normal.  ? 3. The mitral valve is normal in structure. Trivial mitral valve  ?regurgitation. No evidence of mitral stenosis.  ? 4. The aortic valve is normal in structure. Aortic valve regurgitation is  ?not visualized. Aortic valve sclerosis is present, with no evidence of  ?aortic valve stenosis.  ? 5. The inferior vena cava is normal in size with greater than 50%  ?respiratory variability, suggesting right atrial pressure of 3 mmHg. ? ?Patient Profile  ?   ?61 y.o. female with history of HFpEF, COPD, and HTN admitted with acute on chronic hypoxic and hypercapnic respiratory failure secondary to COPD exacerbation and parainfluenza who we are seeing for Afib with RVR. ? ?Assessment & Plan  ?  ?1. New onset Afib with RVR: ?-Maintaining sinus rhythm since spontaneous conversion in the ED ?-Likely in the setting of her acute pulmonary illness and electrolyte derangements  ?-IV amiodarone was held overnight, presumably due to hypotension, will continue to hold this for now given persistent hypotension requiring Levophed and vasopressin  ?-Hold Cardizem as well  ?-No indication for addition of digoxin at this time given she is maintaining sinu rhythm,  and in the setting of trend in her SCr from 0.5 to 1 over the past several days ?-CHADS2VASc at least 3 (CHF, HTN, sex category) ?-Eliquis 5 mg bid ?-TSH normal ?-Potassium and magnesium at goal ?  ?2. HFpEF: ?-Appears grossly euvolemic, obtain CVP as below ?-Diuretics on hold ?  ?3. Acute on chronic hypoxic and hypercarbic respiratory failure with shock: ?-Intubated and and sedated  ?-Secondary to COPD exacerbation and parainfluenza ?-Requiring escalation of vasopressor support with addition of vasopressin this morning with continuation of Levophed  ?-Obtain CVP to assess if she is volume depleted  ?-Per CCM ? ? ?   ? ?For questions or updates, please contact [place] ?Please consult [email or web address] for contact info under Cardiology/STEMI. ?  ? ?Signed, ?Christell Faith, PA-C ?[facility] ?Pager: [phone] ?11/04/2021, 8:34  AM ? ?

## 2021-11-04 NOTE — Progress Notes (Signed)
Notified by patient's worsening mental status and progressive somnolence despite BiPAP therapy. ? ?On exam, the temperature was 37.3?C, the heart rate 76 beats/minute, the blood pressure 177/94 mm Hg, the respiratory rate 21 breaths/minute, and the oxygen saturation 94% on. She was very lethargic, with increased work of breathing and dusky appearance.  Unable to arouse with deep sternal rub patient's eyes noted to be rolling backwards with puffy eyelids. Arterial Blood Gas result:  pO2 195; pCO2 118; pH 7.31;  HCO3 59.4, %O2 Sat 95.  Stat CT head was obtained and showed no acute intracranial abnormality.  Due to rapidly declining mental and respiratory status, decision made to intubate for airway protection. ? ?Rufina Falco, DNP, CCRN, FNP-C, AGACNP-BC ?Acute Care & Family Nurse Practitioner  ?[facility]  ?See Amion for personal pager ?PCCM on call pager [phone] until 7 am ? ? ? ? ?

## 2021-11-04 NOTE — Progress Notes (Signed)
Spoke with patients spouse at this time and updated him on patients current status.  ?

## 2021-11-04 NOTE — Progress Notes (Signed)
Patients husband presented to desk to apologize for granddaughters video recording staff and causing conflict that has been interfering with patient care. Patients husband expressed gratitude for care being provided to his wife.  ?

## 2021-11-05 LAB — BASIC METABOLIC PANEL
Anion gap: 5 (ref 5–15)
BUN: 38 mg/dL — ABNORMAL HIGH (ref 8–23)
CO2: 38 mmol/L — ABNORMAL HIGH (ref 22–32)
Calcium: 7.7 mg/dL — ABNORMAL LOW (ref 8.9–10.3)
Chloride: 91 mmol/L — ABNORMAL LOW (ref 98–111)
Creatinine, Ser: 0.59 mg/dL (ref 0.44–1.00)
GFR, Estimated: >60 mL/min (ref >60)
Glucose, Bld: 169 mg/dL — ABNORMAL HIGH (ref 70–99)
Potassium: 4.1 mmol/L (ref 3.5–5.1)
Sodium: 134 mmol/L — ABNORMAL LOW (ref 135–145)

## 2021-11-05 LAB — MAGNESIUM: Magnesium: 2.4 mg/dL (ref 1.7–2.4)

## 2021-11-05 LAB — TRIGLYCERIDES: Triglycerides: 141 mg/dL (ref <150)

## 2021-11-05 LAB — GLUCOSE, CAPILLARY: Glucose-Capillary: 158 mg/dL — ABNORMAL HIGH (ref 70–99)

## 2021-11-05 LAB — PHOSPHORUS: Phosphorus: 1.5 mg/dL — ABNORMAL LOW (ref 2.5–4.6)

## 2021-11-05 MED ORDER — SODIUM CHLORIDE 0.9% FLUSH: 10.0000 mL | INTRAVENOUS | Status: DC | PRN

## 2021-11-05 MED ORDER — VITAL AF 1.2 CAL PO LIQD
1000.0000 mL | ORAL | Status: DC
  Administered 2021-11-05 – 2021-11-11 (×4): 1000 mL

## 2021-11-05 MED ORDER — SODIUM CHLORIDE 0.9% FLUSH
10.0000 mL | Freq: Two times a day (BID) | INTRAVENOUS | Status: DC
  Administered 2021-11-05 – 2021-11-12 (×13): 10 mL

## 2021-11-05 MED ORDER — AMIODARONE HCL IN DEXTROSE 360-4.14 MG/200ML-% IV SOLN
60.0000 mg/h | INTRAVENOUS | Status: AC
  Administered 2021-11-05 (×2): 60 mg/h via INTRAVENOUS
  Filled 2021-11-05 (×2): qty 200

## 2021-11-05 MED ORDER — AMIODARONE HCL IN DEXTROSE 360-4.14 MG/200ML-% IV SOLN
30.0000 mg/h | INTRAVENOUS | Status: DC
  Administered 2021-11-05 – 2021-11-07 (×5): 30 mg/h via INTRAVENOUS
  Filled 2021-11-05 (×4): qty 200

## 2021-11-05 MED ORDER — POTASSIUM & SODIUM PHOSPHATES 280-160-250 MG PO PACK
1.0000 | PACK | Freq: Three times a day (TID) | ORAL | Status: DC
  Administered 2021-11-05 – 2021-11-06 (×3): 1
  Filled 2021-11-05 (×3): qty 1

## 2021-11-05 MED ORDER — INSULIN ASPART 100 UNIT/ML IJ SOLN
0.0000 [IU] | INTRAMUSCULAR | Status: DC
  Administered 2021-11-06: 2 [IU] via SUBCUTANEOUS
  Administered 2021-11-06: 5 [IU] via SUBCUTANEOUS
  Administered 2021-11-06 – 2021-11-07 (×10): 3 [IU] via SUBCUTANEOUS
  Administered 2021-11-08 (×2): 5 [IU] via SUBCUTANEOUS
  Filled 2021-11-05 (×12): qty 1

## 2021-11-05 MED ORDER — AMIODARONE LOAD VIA INFUSION
150.0000 mg | Freq: Once | INTRAVENOUS | Status: AC
  Administered 2021-11-05: 150 mg via INTRAVENOUS
  Filled 2021-11-05: qty 83.34

## 2021-11-05 MED ORDER — VANCOMYCIN HCL 1750 MG/350ML IV SOLN
1750.0000 mg | Freq: Once | INTRAVENOUS | Status: AC
  Administered 2021-11-05: 1750 mg via INTRAVENOUS
  Filled 2021-11-05: qty 350

## 2021-11-05 NOTE — Progress Notes (Signed)
Propofol turned off and fentanyl dose decreased by half for for sedation vacation and assess patient readiness to wean from vent.  ?

## 2021-11-05 NOTE — Progress Notes (Signed)
? ?NAME:  Virginia Norman, MRN:  [id number], DOB:  Feb 11, 1961, LOS: 8 ?ADMISSION DATE:  10/24/2021, CONSULTATION DATE:  11/03/2021 ?REFERRING MD:  Shawna Clamp MD, CHIEF COMPLAINT:  Severe hypercapnia  ? ?History of Present Illness:  ?Is a 61 year old woman with a medical history of CHF with preserved ejection fraction, COPD and ongoing tobacco abuse, who was admitted on 29 October 2021 with acute on chronic respiratory failure with hypoxia and hypercarbia.  She has been managed in the progressive care unit.  She presented with a working diagnosis of decompensation CHF.  She received diuretics until 4/30.  She was transferred to the intensive care unit due to need for noninvasive ventilation and encephalopathy likely due to her respiratory failure.  She was noted to have severe metabolic derangements to include very low chloride, very high bicarbonate and high PaCO2.  She has significant hemoconcentration on CBC.  We are asked to assist in her management. ? ?Pertinent  Medical History  ?COPD ?CHF with preserved EF ?CAD ?HTN ? ?Significant Hospital Events: ?Including procedures, antibiotic start and stop dates in addition to other pertinent events   ?11/02/2021 transferred to ICU due to severe hypercapnia and severe metabolic derangements ?11/03/2021 requiring AVAPS, stopped all diuretics, Diamox, sodium chloride infusion ?11/03/2021 patient required intubation in the evening CT head negative for acute CNS process ?11/04/2021 on mechanical ventilation ?5/3 remains on vent plan for weaning trial ? ?Interim History / Subjective:  ?Remains critically ill ?Remains intubated ?Severe COPD exacerbation ?Has END STAGE COPD ? ?Objective   ?Blood pressure (!) 148/84, pulse 63, temperature 98.4 ?F (36.9 ?C), resp. rate 20, height 5' 7"  (1.702 m), weight 77.5 kg, SpO2 96 %. ?CVP:  [13 mmHg-25 mmHg] 25 mmHg  ?Vent Mode: PRVC ?FiO2 (%):  [40 %-45 %] 40 % ?Set Rate:  [20 bmp] 20 bmp ?Vt Set:  [450 mL] 450 mL ?PEEP:  [8 cmH20] 8  cmH20 ?Plateau Pressure:  [23 QTM22-63 cmH20] 23 cmH20  ? ?Intake/Output Summary (Last 24 hours) at 11/05/2021 3354 ?Last data filed at 11/05/2021 0630 ?Gross per 24 hour  ?Intake 2594.52 ml  ?Output 1525 ml  ?Net 1069.52 ml  ? ? ?Filed Weights  ? 10/31/2021 1845 11/02/21 1847  ?Weight: 81.2 kg 77.5 kg  ? ? ?REVIEW OF SYSTEMS ? ?PATIENT IS UNABLE TO PROVIDE COMPLETE REVIEW OF SYSTEMS DUE TO SEVERE CRITICAL ILLNESS AND TOXIC METABOLIC ENCEPHALOPATHY ? ? ? ?PHYSICAL EXAMINATION: ? ?GENERAL:critically ill appearing, +resp distress ?EYES: Pupils equal, round, reactive to light.  No scleral icterus.  ?MOUTH: Moist mucosal membrane. INTUBATED ?NECK: Supple.  ?PULMONARY: +rhonchi, +wheezing ?CARDIOVASCULAR: S1 and S2.  No murmurs  ?GASTROINTESTINAL: Soft, nontender, -distended. Positive bowel sounds.  ?MUSCULOSKELETAL: No swelling, clubbing, or edema.  ?NEUROLOGIC: obtunded ?SKIN:intact,warm,dry ? ? ?Resolved Hospital Problem list   ?N/A ? 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 ?Severe ACUTE Hypoxic and Hypercapnic Respiratory Failure ?-continue Mechanical Ventilator support ?-Wean Fio2 and PEEP as tolerated ?-VAP/VENT bundle implementation ?- Wean PEEP & FiO2 as tolerated, maintain SpO2 > 88% ?- Head of bed elevated 30 degrees, VAP protocol in place ?- Plateau pressures less than 30 cm H20  ?- Intermittent chest x-ray & ABG PRN ?- Ensure adequate pulmonary hygiene  ?-will perform SAT/SBT when respiratory parameters are met ? ?SEVERE COPD EXACERBATION ?-continue IV  steroids as prescribed ?-continue NEB THERAPY as prescribed ?-morphine as needed ?-wean fio2 as needed and tolerated ?Chronic hypercarbic respiratory failure with severe hypercapnia in the setting of severe metabolic  (contraction) alkalosis ?Other factors: COPD, RB ILD, Severe kyphosis with restrictive physiology ? ? ?SEPTIC/hypovolemia  shock ?SOURCE-pneumonia ?-use vasopressors to keep MAP>65 as needed ?-follow ABG and LA as needed ?-follow up cultures ?-emperic ABX ? ?INFECTIOUS DISEASE ?-continue antibiotics as prescribed ?-follow up cultures ?Fever, leukocytosis ?Copious thick secretions/mucous plugs ? ?ACUTE  CARDIAC FAILURE-due to Paroxysmal atrial fibrillationF ?-oxygen as needed ?-Lasix as tolerated ? ? ? ?NEUROLOGY ?ACUTE TOXIC METABOLIC ENCEPHALOPATHY ?WUA today ? ? ?ENDO ?- ICU hypoglycemic\Hyperglycemia protocol ?-check FSBS per protocol ? ? ?GI ?GI PROPHYLAXIS as indicated ? ?NUTRITIONAL STATUS ?DIET-->TF's as tolerated ?Constipation protocol as indicated ? ? ?ELECTROLYTES ?-follow labs as needed ?-replace as needed ?-pharmacy consultation and following ? ? ? ? ? ? ? ?Best Practice (right click and "Reselect all SmartList Selections" daily)  ? ?Diet/type: NPO ?DVT prophylaxis: DOAC ?GI prophylaxis: N/A ?Lines: Central line and Arterial Line 5/02 ?Foley:  Yes, and it is still needed ?Code Status:  full code ? ? ?Labs   ?CBC: ?Recent Labs  ?Lab 11/02/21 ?8469 11/03/21 ?6295 11/04/21 ?2841  ?WBC 9.3 13.4* 21.5*  ?NEUTROABS  --   --  18.0*  ?HGB 16.1* 15.3* 13.6  ?HCT 52.7* 51.1* 45.1  ?MCV 93.3 95.7 96.6  ?PLT 279 314 300  ? ? ? ?Basic Metabolic Panel: ?Recent Labs  ?Lab 10/31/21 ?1259 11/02/21 ?3244 11/03/21 ?0102 11/03/21 ?1437 11/04/21 ?7253 11/05/21 ?6644  ?NA  --  137 139 135 139 134*  ?K  --  4.1 4.5 4.4 4.5 4.1  ?CL  --  80* 80* 78* 90* 91*  ?CO2  --  >45* >45* >45* 43* 38*  ?GLUCOSE  --  137* 135* 247* 158* 169*  ?BUN  --  23 29* 37* 43* 38*  ?CREATININE  --  0.51 0.58 0.90 1.04* 0.59  ?CALCIUM  --  8.8* 9.0 9.1 8.1* 7.7*  ?MG 2.6* 2.8* 2.3  --  2.5* 2.4  ?PHOS  --  6.0* 5.0*  --  2.4* 1.5*  ? ? ?GFR: ?Estimated Creatinine Clearance: 78.3 mL/min (by C-G formula based on SCr of 0.59 mg/dL). ?Recent Labs  ?Lab  11/02/21 ?0347 11/03/21 ?4259 11/04/21 ?5638  ?WBC 9.3 13.4* 21.5*  ? ? ? ?ABG ?   ?Component Value Date/Time  ? PHART 7.42 11/04/2021 1206  ? PCO2ART 81 (HH) 11/04/2021 1206  ? PO2ART 84 11/04/2021 1206  ? HCO3 52.5 (H) 11/04/2021 1206  ? O2SAT 97.6 11/04/2021 1206  ? ?  ?HbA1C: ?Hgb A1c MFr Bld  ?Date/Time Value Ref Range Status  ?07/17/2021 06:23 AM 5.8 (H) 4.8 - 5.6 % Final  ?  Comment:  ?  (NOTE) ?Pre diabetes:          5.7%-6.4% ? ?Diabetes:              >6.4% ? ?Glycemic control for   <7.0% ?adults with diabetes ?  ?10/03/2019 04:45 AM 6.3 (H) 4.8 - 5.6 % Final  ?  Comment:  ?  (NOTE) ?Pre diabetes:          5.7%-6.4% ?Diabetes:              >6.4% ?Glycemic control for   <7.0% ?adults with diabetes ?  ? ? ?CBG: ?Recent Labs  ?Lab 11/02/21 ?1850  ?GLUCAP 155*  ? ? ? ? ?Home Medications  ?Prior  to Admission medications   ?Medication Sig Start Date End Date Taking? Authorizing Provider  ?albuterol (VENTOLIN HFA) 108 (90 Base) MCG/ACT inhaler Inhale 1-2 puffs into the lungs every 6 (six) hours as needed for wheezing or shortness of breath. 09/01/21  Yes Darylene Price A, FNP  ?aspirin EC 81 MG tablet Take 81 mg by mouth daily.   Yes [provider]  ?empagliflozin (JARDIANCE) 10 MG TABS tablet Take 1 tablet (10 mg total) by mouth daily before breakfast. 07/30/21  Yes Alisa Graff, FNP  ?mometasone-formoterol (DULERA) 100-5 MCG/ACT AERO Inhale 2 puffs into the lungs in the morning and at bedtime. 07/22/21  Yes Dessa Phi, DO  ?Multiple Vitamins-Minerals (MULTIVITAMIN WITH MINERALS) tablet Take 1 tablet by mouth daily.   Yes [provider]  ?potassium chloride SA (KLOR-CON M) 20 MEQ tablet Take 1 tablet (20 mEq total) by mouth daily. 09/02/21  Yes Alisa Graff, FNP  ?tiotropium (SPIRIVA) 18 MCG inhalation capsule Place 18 mcg into inhaler and inhale daily.   Yes [provider]  ?hydrochlorothiazide (HYDRODIURIL) 12.5 MG tablet Take 1 tablet (12.5 mg total) by mouth daily. ?Patient  not taking: Reported on 10/29/2021 07/22/21   Dessa Phi, DO  ?hydrochlorothiazide (MICROZIDE) 12.5 MG capsule Take 12.5 mg by mouth daily. ?Patient not taking: Reported on 10/29/2021 07/22/21   Provider, Historical,

## 2021-11-05 NOTE — Progress Notes (Signed)
Patient following all simple commands.  Patient able to squeeze with both, grip is equal.  Patient able to raise arms up on command.  Patient able to stick out tongue on command.  Patient able to lift head off pillow.  Patient able to wiggle toes and move both legs.  Fentanyl turned off and Amy with respiratory called to start spontaneous breathing trail.  ?

## 2021-11-05 NOTE — TOC Initial Note (Signed)
Transition of Care (TOC) - Initial/Assessment Note  ? ? ?Patient Details  ?Name: Virginia Norman ?MRN: [id number] ?Date of Birth: [birth year] ? ?Transition of Care (TOC) CM/SW Contact:    ?Allayne Butcher, RN ?Phone Number: ?11/05/2021, 9:27 AM ? ?Clinical Narrative:                 ?Patient admitted to the hospital with acute respiratory failure, history of COPD on chronic oxygen 3.5 L [place].  Patient required intubation on 5/1- patient is currently in the ICU intubated and sedated.  Patient is from home with her husband.   ? ?TOC will follow for needs.  ? ?Expected Discharge Plan: Home w Home Health Services ?Barriers to Discharge: Continued Medical Work up ? ? ?Patient Goals and CMS Choice ?Patient states their goals for this hospitalization and ongoing recovery are:: intubated in ICU ?  ?  ? ?Expected Discharge Plan and Services ?Expected Discharge Plan: Home w Home Health Services ?  ?Discharge Planning Services: CM Consult ?  ?Living arrangements for the past 2 months: Single Family Home ?                ?  ?  ?  ?  ?  ?  ?  ?  ?  ?  ? ?Prior Living Arrangements/Services ?Living arrangements for the past 2 months: Single Family Home ?Lives with:: Spouse ?Patient language and need for interpreter reviewed:: Yes ?       ?Need for Family Participation in Patient Care: Yes (Comment) ?Care giver support system in place?: Yes (comment) ?  ?Criminal Activity/Legal Involvement Pertinent to Current Situation/Hospitalization: No - Comment as needed ? ?Activities of Daily Living ?Home Assistive Devices/Equipment: Oxygen ?ADL Screening (condition at time of admission) ?Patient's cognitive ability adequate to safely complete daily activities?: Yes ?Is the patient deaf or have difficulty hearing?: No ?Does the patient have difficulty seeing, even when wearing glasses/contacts?: No ?Does the patient have difficulty concentrating, remembering, or making decisions?: No ?Patient able to express need for assistance with ADLs?: Yes ?Does  the patient have difficulty dressing or bathing?: No ?Independently performs ADLs?: Yes (appropriate for developmental age) ?Does the patient have difficulty walking or climbing stairs?: No ?Weakness of Legs: None ?Weakness of Arms/Hands: None ? ?Permission Sought/Granted ?Permission sought to share information with : Case Manager, Family Supports ?  ? Share Information with NAME: Coy Saunas ?   ? Permission granted to share info w Relationship: husband ? Permission granted to share info w Contact Information: [phone] ? ?Emotional Assessment ?Appearance:: Appears stated age ?Attitude/Demeanor/Rapport: Intubated (Following Commands or Not Following Commands) ?Affect (typically observed): Unable to Assess ?  ?Alcohol / Substance Use: Not Applicable ?Psych Involvement: No (comment) ? ?Admission diagnosis:  COPD exacerbation (HCC) [J44.1] ?Acute respiratory failure with hypoxia and hypercarbia (HCC) [J96.01, J96.02] ?Acute respiratory failure with hypoxia and hypercapnia (HCC) [J96.01, J96.02] ?Patient Active Problem List  ? Diagnosis Date Noted  ? Paroxysmal atrial fibrillation (HCC)   ? Chronic diastolic CHF (congestive heart failure) (HCC) 10/29/2021  ? Acute on chronic diastolic CHF (congestive heart failure) (HCC) 07/16/2021  ? Acute on chronic diastolic (congestive) heart failure (HCC) 07/16/2021  ? Elevated troponin   ? Hyponatremia   ? Sepsis (HCC)   ? Acute respiratory failure with hypoxia and hypercapnia (HCC)   ? Acute diastolic CHF (congestive heart failure) (HCC)   ? Acute on chronic respiratory failure with hypoxia (HCC)   ? Benign essential HTN 10/02/2019  ? CAD (coronary artery disease) 10/02/2019  ?  COPD exacerbation (HCC) 10/02/2019  ? ?PCP:  [facility] ?Pharmacy:   ?[facility] - [place], [place] - [address] ?[address] ?[place] [address] ?Phone: [phone] Fax: [phone] ? ? ? ? ?Social Determinants of Health (SDOH) Interventions ?   ? ?Readmission Risk Interventions ?   ? View : No data to display.  ?  ?  ?  ? ? ? ?

## 2021-11-05 NOTE — Progress Notes (Signed)
Virginia Ditty, NP at bedside for spontaneous breathing trial. Patient 02 SATs dropped to 84% due and patient not breathing fast enough.  Sedation started back per Jeremiah's request and placed back on full vent support.  ?

## 2021-11-05 NOTE — Consult Note (Signed)
PHARMACY CONSULT NOTE ? ?Pharmacy Consult for Electrolyte Monitoring and Replacement  ? ?Recent Labs: ?Potassium (mmol/L)  ?Date Value  ?11/05/2021 4.1  ?04/05/2014 4.6  ? ?Magnesium (mg/dL)  ?Date Value  ?11/05/2021 2.4  ? ?Calcium (mg/dL)  ?Date Value  ?11/05/2021 7.7 (L)  ? ?Calcium, Total (mg/dL)  ?Date Value  ?04/05/2014 8.2 (L)  ? ?Albumin (g/dL)  ?Date Value  ?11/04/2021 3.0 (L)  ? ?Phosphorus (mg/dL)  ?Date Value  ?11/05/2021 1.5 (L)  ? ?Sodium (mmol/L)  ?Date Value  ?11/05/2021 134 (L)  ?04/05/2014 141  ? ?Assessment: ?Patient is a 61 y.o. female with medical history including HFpEF, COPD, tobacco use disorder admitted on 10/11/2021 with acute on chronic respiratory failure. Admission complicated by severe metabolic derangements requiring transfer to ICU and ultimately intubation on 5/2. Pharmacy consulted to assist with electrolyte monitoring and replacement as indicated. ? ?MIVF: NS at 75 cc/hr (started for diuretic induced contraction alkalosis) ?Nutrition: Tube feeds started 5/2 ? ?Goal of Therapy:  ?Electrolytes within normal limits ? ?Plan:  ?--Phos 1.5, suspect re-feeding. Phos-Nak 1 packet per tube TIDHS scheduled for now ?--Follow-up electrolytes with AM labs tomorrow ? ?Virginia Norman ?11/05/2021 2:56 PM  ?

## 2021-11-05 NOTE — Consult Note (Signed)
Pharmacy Antibiotic Note ? ?Virginia Norman is a 61 y.o. female with medical history including HFpEF, COPD, tobacco use disorder admitted on 10/22/2021 with acute on chronic respiratory failure. Admission complicated by severe metabolic derangements requiring transfer to ICU and ultimately intubation on 5/2. New onset of fevers and worsening leukocytosis on 5/2 and now there is concern for hospital acquired pneumonia. Pharmacy has been consulted for cefepime and vancomycin dosing. ? ?Plan: ? ?Cefepime 2 g IV q8h ? ?Vancomycin 1.75 g IV x 1 again today ?--Daily Scr per protocol (has been variable), plan to start maintenance regimen tomorrow if therapy continued and renal function stable ?--Levels at steady state as clinically indicated ? ?Height: 5\' 7"  (170.2 cm) ?Weight: 77.5 kg (170 lb 13.7 oz) ?IBW/kg (Calculated) : 61.6 ? ?Temp (24hrs), Avg:99.4 ?F (37.4 ?C), Min:98.1 ?F (36.7 ?C), Max:100 ?F (37.8 ?C) ? ?Recent Labs  ?Lab 11/02/21 ?[id number] 11/03/21 ?[id number] 11/03/21 ?1437 11/04/21 ?[id number] 11/05/21 ?[id number]  ?WBC 9.3 13.4*  --  21.5*  --   ?CREATININE 0.51 0.58 0.90 1.04* 0.59  ? ?  ?Estimated Creatinine Clearance: 78.3 mL/min (by C-G formula based on SCr of 0.59 mg/dL).   ? ?No Known Allergies ? ?Antimicrobials this admission: ?Ceftriaxone 4/26 x 1 ?Vancomycin 5/2 >>  ?Cefepime 5/2 >>  ? ?Dose adjustments this admission: ?N/A ? ?Microbiology results: ?4/26 Expanded viral panel: (+) Parainfluenza virus 3 ?4/30 MRSA PCR: (-) ?5/2 Tracheal aspirate: pending ? ?Thank you for allowing pharmacy to be a part of this patient?s care. ? ?Benita Gutter ?11/05/2021 2:50 PM ? ?

## 2021-11-05 NOTE — Progress Notes (Signed)
? ?Cardiology Progress Note  ? ?Patient Name: Virginia Norman ?Date of Encounter: 11/05/2021 ? ?Primary Cardiologist: Ida Rogue, MD ? ?Subjective  ? ?Intubated, sedated.  Recurrent Afib/flutter - 119-120 bpm this AM @ 8:01. ? ?Inpatient Medications  ?  ?Scheduled Meds: ? amiodarone  150 mg Intravenous Once  ? apixaban  5 mg Per Tube BID  ? chlorhexidine gluconate (MEDLINE KIT)  15 mL Mouth Rinse BID  ? Chlorhexidine Gluconate Cloth  6 each Topical Daily  ? docusate  100 mg Per Tube BID  ? feeding supplement (VITAL AF 1.2 CAL)  1,000 mL Per Tube Q24H  ? free water  30 mL Per Tube Q4H  ? guaiFENesin  600 mg Oral BID  ? ipratropium-albuterol  3 mL Nebulization Q6H  ? mouth rinse  15 mL Mouth Rinse 10 times per day  ? multivitamin with minerals  1 tablet Oral Daily  ? pantoprazole sodium  40 mg Per Tube Daily  ? senna  1 tablet Per Tube Daily  ? vancomycin variable dose per unstable renal function (pharmacist dosing)   Does not apply See admin instructions  ? ?Continuous Infusions: ? sodium chloride 75 mL/hr at 11/04/21 1900  ? amiodarone    ? Followed by  ? amiodarone    ? ceFEPime (MAXIPIME) IV 2 g (11/05/21 0630)  ? fentaNYL infusion INTRAVENOUS 150 mcg/hr (11/05/21 0409)  ? norepinephrine (LEVOPHED) Adult infusion 9 mcg/min (11/05/21 8182)  ? propofol (DIPRIVAN) infusion 5 mcg/kg/min (11/05/21 0630)  ? vasopressin 0.03 Units/min (11/05/21 0113)  ? ?PRN Meds: ?acetaminophen **OR** acetaminophen, fentaNYL, hydrALAZINE, magnesium hydroxide, metoprolol tartrate, ondansetron **OR** ondansetron (ZOFRAN) IV  ? ?Vital Signs  ?  ?Vitals:  ? 11/05/21 0500 11/05/21 0600 11/05/21 0800 11/05/21 0830  ?BP: 98/60 (!) 148/84    ?Pulse: 65 63    ?Resp: 20 20    ?Temp: 98.4 ?F (36.9 ?C) 98.4 ?F (36.9 ?C)  99 ?F (37.2 ?C)  ?TempSrc:   Esophageal Oral  ?SpO2: 97% 96%    ?Weight:      ?Height:      ? ? ?Intake/Output Summary (Last 24 hours) at 11/05/2021 1031 ?Last data filed at 11/05/2021 0630 ?Gross per 24 hour  ?Intake 1796.84 ml   ?Output 1525 ml  ?Net 271.84 ml  ? ?Filed Weights  ? 10/21/2021 1845 11/02/21 1847  ?Weight: 81.2 kg 77.5 kg  ? ? ?Physical Exam  ? ?GEN: Well nourished, well developed.  Intubated/sedated. ?HEENT: Grossly normal.  ?Neck: Supple, no JVD, carotid bruits, or masses. ?Cardiac: IR, IR, 3/6 syst murmur, heard throughout - loudest @ the [facility]  [place].  No rubs or gallops. No clubbing, cyanosis, edema.  Radials 2+, DP/PT 2+ and equal bilaterally.  ?Respiratory:  Respirations regular and unlabored, clear to auscultation bilaterally. ?GI: Soft, nontender, nondistended, BS + x 4. ?MS: no deformity or atrophy. ?Skin: warm and dry, no rash. ?Neuro:  Unresponsive/sedated. ?Psych: Sedated. ? ?Labs  ?  ?Chemistry ?Recent Labs  ?Lab 11/03/21 ?1437 11/04/21 ?9937 11/05/21 ?1696  ?NA 135 139 134*  ?K 4.4 4.5 4.1  ?CL 78* 90* 91*  ?CO2 >45* 43* 38*  ?GLUCOSE 247* 158* 169*  ?BUN 37* 43* 38*  ?CREATININE 0.90 1.04* 0.59  ?CALCIUM 9.1 8.1* 7.7*  ?ALBUMIN  --  3.0*  --   ?GFRNONAA >60 >60 >60  ?ANIONGAP NOT CALCULATED 6 5  ?  ? ?Hematology ?Recent Labs  ?Lab 11/02/21 ?7893 11/03/21 ?8101 11/04/21 ?7510  ?WBC 9.3 13.4* 21.5*  ?RBC 5.65* 5.34* 4.67  ?  HGB 16.1* 15.3* 13.6  ?HCT 52.7* 51.1* 45.1  ?MCV 93.3 95.7 96.6  ?MCH 28.5 28.7 29.1  ?MCHC 30.6 29.9* 30.2  ?RDW 12.7 12.2 12.8  ?PLT 279 314 300  ? ? ?Cardiac Enzymes  ?Recent Labs  ?Lab 10/22/2021 ?1904 10/08/2021 ?2118  ?TROPONINIHS 18* 19*  ?   ? ?BNP ?   ?Component Value Date/Time  ? BNP 129.0 (H) 10/13/2021 1904  ? ?Lipids  ?Lab Results  ?Component Value Date  ? CHOL 141 07/17/2021  ? HDL 31 (L) 07/17/2021  ? [place] 91 07/17/2021  ? TRIG 141 11/05/2021  ? CHOLHDL 4.5 07/17/2021  ? ? ?HbA1c  ?Lab Results  ?Component Value Date  ? HGBA1C 5.8 (H) 07/17/2021  ? ? ?Radiology  ?  ?CT HEAD WO CONTRAST (5MM) ? ?Result Date: 11/03/2021 ?CLINICAL DATA:  Altered mental status EXAM: CT HEAD WITHOUT CONTRAST TECHNIQUE: Contiguous axial images were obtained from the base of the skull through the vertex  without intravenous contrast. RADIATION DOSE REDUCTION: This exam was performed according to the departmental dose-optimization program which includes automated exposure control, adjustment of the mA and/or kV according to patient size and/or use of iterative reconstruction technique. COMPARISON:  None. FINDINGS: Brain: There is no mass, hemorrhage or extra-axial collection. The size and configuration of the ventricles and extra-axial CSF spaces are normal. The brain parenchyma is normal, without acute or chronic infarction. Dilated perivascular space inferior to the left lentiform nucleus. Vascular: No abnormal hyperdensity of the major intracranial arteries or dural venous sinuses. No intracranial atherosclerosis. Skull: The visualized skull base, calvarium and extracranial soft tissues are normal. Sinuses/Orbits: Right ethmoid opacification. Mastoids are clear. The orbits are normal. IMPRESSION: No acute intracranial abnormality. Electronically Signed   By: Ulyses Jarred M.D.   On: 11/03/2021 22:26  ? ?DG Chest Port 1 View ? ?Result Date: 11/04/2021 ?CLINICAL DATA:  Check line placement EXAM: PORTABLE CHEST 1 VIEW COMPARISON:  Film from the previous day. FINDINGS: Cardiac shadow is stable. Left jugular central line is noted in the proximal superior vena cava. No pneumothorax is seen. Endotracheal tube and gastric catheter are noted in satisfactory position. Lungs are well aerated bilaterally. Vascular congestion is again identified as well as some interstitial markings stable from the prior study. IMPRESSION: Tubes and lines in satisfactory position. Otherwise stable appearance of chest. Electronically Signed   By: Inez Catalina M.D.   On: 11/04/2021 02:40  ? ?DG Chest Port 1 View ? ?Result Date: 11/03/2021 ?CLINICAL DATA:  Provided history: Acute respiratory failure. CHF. CAD. COPD. EXAM: PORTABLE CHEST 1 VIEW COMPARISON:  Prior chest radiographs 11/02/2021 and earlier. FINDINGS: Heart size at upper limits of normal.  Central pulmonary vascular congestion. Prominence of the interstitial lung markings throughout both lungs. Mild linear atelectasis within the lateral left lung base. No evidence of pleural effusion or pneumothorax. No acute bony abnormality identified. IMPRESSION: Central pulmonary vascular congestion. Prominence of the interstitial lung markings throughout both lungs, which may reflect interstitial edema. However, atypical/viral infection may have a similar radiographic appearance and clinical correlation is recommended. Mild linear atelectasis within the left lung base. Aortic Atherosclerosis (ICD10-I70.0). Electronically Signed   By: Kellie Simmering D.O.   On: 11/03/2021 11:17  ? ?DG Chest Port 1 View ? ?Result Date: 11/02/2021 ?CLINICAL DATA:  Increased shortness of breath EXAM: PORTABLE CHEST 1 VIEW COMPARISON:  10/10/2021 FINDINGS: Cardiac size is within normal limits. There are no signs of alveolar pulmonary edema or focal pulmonary consolidation. There is no pleural effusion  or pneumothorax. IMPRESSION: There are no signs of pulmonary edema or focal pulmonary consolidation. Electronically Signed   By: Elmer Picker M.D.   On: 11/02/2021 17:48   ? ?Telemetry  ?  ?Back in afib/flutter @ 8:01 this AM - rates 119-121 - Personally Reviewed ? ?Cardiac Studies  ? ?2D Echocardiogram 4.26.2023 ? ?1. Left ventricular ejection fraction, by estimation, is 50 to 55%. The  ?left ventricle has low normal function. The left ventricle has no regional  ?wall motion abnormalities. Left ventricular diastolic parameters are  ?consistent with Grade I diastolic  ?dysfunction (impaired relaxation).  ? 2. Right ventricular systolic function is normal. The right ventricular  ?size is normal.  ? 3. The mitral valve is normal in structure. Trivial mitral valve  ?regurgitation. No evidence of mitral stenosis.  ? 4. The aortic valve is normal in structure. Aortic valve regurgitation is  ?not visualized. Aortic valve sclerosis is  present, with no evidence of  ?aortic valve stenosis.  ? 5. The inferior vena cava is normal in size with greater than 50%  ?respiratory variability, suggesting right atrial pressure of 3 mmHg.  ?_____________  ? ?

## 2021-11-06 DIAGNOSIS — J122 Parainfluenza virus pneumonia: Secondary | ICD-10-CM

## 2021-11-06 LAB — GLUCOSE, CAPILLARY
Glucose-Capillary: 153 mg/dL — ABNORMAL HIGH (ref 70–99)
Glucose-Capillary: 150 mg/dL — ABNORMAL HIGH (ref 70–99)
Glucose-Capillary: 175 mg/dL — ABNORMAL HIGH (ref 70–99)
Glucose-Capillary: 197 mg/dL — ABNORMAL HIGH (ref 70–99)
Glucose-Capillary: 161 mg/dL — ABNORMAL HIGH (ref 70–99)
Glucose-Capillary: 204 mg/dL — ABNORMAL HIGH (ref 70–99)

## 2021-11-06 LAB — BLOOD GAS, ARTERIAL
Acid-Base Excess: 12.8 mmol/L — ABNORMAL HIGH (ref 0.0–2.0)
Bicarbonate: 42 mmol/L — ABNORMAL HIGH (ref 20.0–28.0)
FIO2: 40 %
MECHVT: 450 mL
O2 Saturation: 98.9 %
PEEP: 8 cmH2O
Patient temperature: 37
RATE: 20 resp/min
pCO2 arterial: 76 mmHg (ref 32–48)
pH, Arterial: 7.35 (ref 7.35–7.45)
pO2, Arterial: 102 mmHg (ref 83–108)

## 2021-11-06 LAB — CBC
HCT: 36.2 % (ref 36.0–46.0)
Hemoglobin: 10.9 g/dL — ABNORMAL LOW (ref 12.0–15.0)
MCH: 28.5 pg (ref 26.0–34.0)
MCHC: 30.1 g/dL (ref 30.0–36.0)
MCV: 94.8 fL (ref 80.0–100.0)
Platelets: 208 10*3/uL (ref 150–400)
RBC: 3.82 MIL/uL — ABNORMAL LOW (ref 3.87–5.11)
RDW: 13.3 % (ref 11.5–15.5)
WBC: 15.8 10*3/uL — ABNORMAL HIGH (ref 4.0–10.5)
nRBC: 0 % (ref 0.0–0.2)

## 2021-11-06 LAB — BASIC METABOLIC PANEL
Anion gap: 3 — ABNORMAL LOW (ref 5–15)
BUN: 34 mg/dL — ABNORMAL HIGH (ref 8–23)
CO2: 39 mmol/L — ABNORMAL HIGH (ref 22–32)
Calcium: 7.2 mg/dL — ABNORMAL LOW (ref 8.9–10.3)
Chloride: 93 mmol/L — ABNORMAL LOW (ref 98–111)
Creatinine, Ser: 0.6 mg/dL (ref 0.44–1.00)
GFR, Estimated: >60 mL/min (ref >60)
Glucose, Bld: 144 mg/dL — ABNORMAL HIGH (ref 70–99)
Potassium: 4.4 mmol/L (ref 3.5–5.1)
Sodium: 135 mmol/L (ref 135–145)

## 2021-11-06 LAB — PHOSPHORUS: Phosphorus: 2.8 mg/dL (ref 2.5–4.6)

## 2021-11-06 LAB — MAGNESIUM: Magnesium: 2 mg/dL (ref 1.7–2.4)

## 2021-11-06 MED ORDER — NOREPINEPHRINE 16 MG/250ML-% IV SOLN
0.0000 ug/min | INTRAVENOUS | Status: DC
  Administered 2021-11-06: 14 ug/min via INTRAVENOUS
  Administered 2021-11-06: 10 ug/min via INTRAVENOUS
  Administered 2021-11-07 – 2021-11-08 (×2): 16 ug/min via INTRAVENOUS
  Filled 2021-11-06 (×6): qty 250

## 2021-11-06 MED ORDER — SENNA 8.6 MG PO TABS
2.0000 | ORAL_TABLET | Freq: Two times a day (BID) | ORAL | Status: DC
  Administered 2021-11-06 – 2021-11-11 (×11): 17.2 mg
  Filled 2021-11-06 (×11): qty 2

## 2021-11-06 MED ORDER — METHYLPREDNISOLONE SODIUM SUCC 40 MG IJ SOLR
40.0000 mg | Freq: Two times a day (BID) | INTRAMUSCULAR | Status: DC
  Administered 2021-11-06 – 2021-11-10 (×9): 40 mg via INTRAVENOUS
  Filled 2021-11-06 (×9): qty 1

## 2021-11-06 MED ORDER — SODIUM CHLORIDE 0.9 % IV SOLN
3.0000 g | Freq: Four times a day (QID) | INTRAVENOUS | Status: DC
  Administered 2021-11-06 – 2021-11-10 (×17): 3 g via INTRAVENOUS
  Filled 2021-11-06: qty 3
  Filled 2021-11-06: qty 8
  Filled 2021-11-06 (×2): qty 3
  Filled 2021-11-06: qty 8
  Filled 2021-11-06: qty 3
  Filled 2021-11-06: qty 8
  Filled 2021-11-06 (×3): qty 3
  Filled 2021-11-06 (×3): qty 8
  Filled 2021-11-06 (×3): qty 3
  Filled 2021-11-06 (×2): qty 8
  Filled 2021-11-06: qty 3
  Filled 2021-11-06: qty 8

## 2021-11-06 MED ORDER — LACTATED RINGERS IV BOLUS
500.0000 mL | Freq: Once | INTRAVENOUS | Status: AC
  Administered 2021-11-06: 500 mL via INTRAVENOUS

## 2021-11-06 NOTE — IPAL (Signed)
?  Interdisciplinary Goals of Care Family Meeting ? ? ?Date carried out: 11/06/2021 ? ?Location of the meeting: Bedside ? ?Member's involved: Physician, Bedside Registered Nurse, and Family Member or next of kin ? ? ?Code status: Full Code ? ?Disposition: Continue current acute care ? ? ? ?GOALS OF CARE DISCUSSION ? ?The Clinical status was relayed to family in detail-Daughter/Husband at bedside ? ?Updated and notified of patients medical condition- ?Patient remains unresponsive and will not open eyes to command.   ?Patient is having a weak cough and struggling to remove secretions.   ?Patient with increased WOB and using accessory muscles to breathe ?Explained to family course of therapy and the modalities  ? ?Patient with Progressive multiorgan failure with a very high probablity of a very minimal chance of meaningful recovery despite all aggressive and optimal medical therapy.  ?PATIENT REMAINS FULL CODE ?Failure to wean from VENT failed SAT/SBT ?Severe COPD ? ? ?Family understands the situation. ? ? ?Family are satisfied with Plan of action and management. All questions answered ? ?Additional CC time 25 mins ? ? ?Lucie Leather, M.D.  ?[facility]  ?Medical Director [facility] [place] ?Medical Director [facility] Cardio-Pulmonary Department  ? ? ? ?

## 2021-11-06 NOTE — Progress Notes (Signed)
? ?Cardiology Progress Note  ? ?Patient Name: Virginia Norman ?Date of Encounter: 11/06/2021 ? ?Primary Cardiologist: Ida Rogue, MD ? ?Subjective  ? ?Intubated, sedated, family at bedside. ? ?Inpatient Medications  ?  ?Scheduled Meds: ? apixaban  5 mg Per Tube BID  ? chlorhexidine gluconate (MEDLINE KIT)  15 mL Mouth Rinse BID  ? Chlorhexidine Gluconate Cloth  6 each Topical Daily  ? docusate  100 mg Per Tube BID  ? free water  30 mL Per Tube Q4H  ? guaiFENesin  600 mg Oral BID  ? insulin aspart  0-15 Units Subcutaneous Q4H  ? ipratropium-albuterol  3 mL Nebulization Q6H  ? mouth rinse  15 mL Mouth Rinse 10 times per day  ? methylPREDNISolone (SOLU-MEDROL) injection  40 mg Intravenous Q12H  ? pantoprazole sodium  40 mg Per Tube Daily  ? senna  1 tablet Per Tube Daily  ? sodium chloride flush  10-40 mL Intracatheter Q12H  ? vancomycin variable dose per unstable renal function (pharmacist dosing)   Does not apply See admin instructions  ? ?Continuous Infusions: ? amiodarone 30 mg/hr (11/06/21 1000)  ? ceFEPime (MAXIPIME) IV Stopped (11/06/21 6295)  ? feeding supplement (VITAL AF 1.2 CAL) 55 mL/hr at 11/06/21 0900  ? fentaNYL infusion INTRAVENOUS 75 mcg/hr (11/06/21 1000)  ? norepinephrine (LEVOPHED) Adult infusion 15 mcg/min (11/06/21 1000)  ? propofol (DIPRIVAN) infusion 25 mcg/kg/min (11/06/21 1000)  ? vasopressin 0.03 Units/min (11/06/21 1000)  ? ?PRN Meds: ?acetaminophen **OR** acetaminophen, fentaNYL, hydrALAZINE, magnesium hydroxide, metoprolol tartrate, ondansetron **OR** ondansetron (ZOFRAN) IV, sodium chloride flush  ? ?Vital Signs  ?  ?Vitals:  ? 11/06/21 1030 11/06/21 1045 11/06/21 1100 11/06/21 1115  ?BP: (!) 155/83 (!) 99/58 (!) 158/83 (!) 102/56  ?Pulse: 73 74 71 71  ?Resp: (!) 21 (!) 21 (!) 23 (!) 22  ?Temp:      ?TempSrc:      ?SpO2: 96% 93% 97% 95%  ?Weight:      ?Height:      ? ? ?Intake/Output Summary (Last 24 hours) at 11/06/2021 1136 ?Last data filed at 11/06/2021 1000 ?Gross per 24 hour  ?Intake  6977.92 ml  ?Output 2170 ml  ?Net 4807.92 ml  ? ?Filed Weights  ? 10/22/2021 1845 11/02/21 1847 11/06/21 0500  ?Weight: 81.2 kg 77.5 kg 77.2 kg  ? ? ?Physical Exam  ? ?GEN: Well nourished, well developed, in no acute distress.  ?HEENT: Grossly normal.  ?Neck: Supple, no JVD, carotid bruits, or masses. ?Cardiac: RRR, 2/6 systolic murmur throughout-loudest at the left lower sternal border to apex.  No rubs or gallops. No clubbing, cyanosis, edema.  Radials 2+, DP/PT 2+ and equal bilaterally.  ?Respiratory: Diminished breath sounds bilaterally. ?GI: Soft, nontender, nondistended, BS + x 4. ?MS: no deformity or atrophy. ?Skin: warm and dry, no rash. ?Neuro:  Strength and sensation are intact. ?Psych: AAOx3.  Normal affect. ? ?Labs  ?  ?Chemistry ?Recent Labs  ?Lab 11/04/21 ?2841 11/05/21 ?3244 11/06/21 ?0102  ?NA 139 134* 135  ?K 4.5 4.1 4.4  ?CL 90* 91* 93*  ?CO2 43* 38* 39*  ?GLUCOSE 158* 169* 144*  ?BUN 43* 38* 34*  ?CREATININE 1.04* 0.59 0.60  ?CALCIUM 8.1* 7.7* 7.2*  ?ALBUMIN 3.0*  --   --   ?GFRNONAA >60 >60 >60  ?ANIONGAP 6 5 3*  ?  ? ?Hematology ?Recent Labs  ?Lab 11/03/21 ?7253 11/04/21 ?6644 11/06/21 ?0347  ?WBC 13.4* 21.5* 15.8*  ?RBC 5.34* 4.67 3.82*  ?HGB 15.3* 13.6 10.9*  ?  HCT 51.1* 45.1 36.2  ?MCV 95.7 96.6 94.8  ?MCH 28.7 29.1 28.5  ?MCHC 29.9* 30.2 30.1  ?RDW 12.2 12.8 13.3  ?PLT 314 300 208  ? ? ?Cardiac Enzymes  ?Recent Labs  ?Lab 10/07/2021 ?1904 10/26/2021 ?2118  ?TROPONINIHS 18* 19*  ?   ? ?BNP ?   ?Component Value Date/Time  ? BNP 129.0 (H) 10/12/2021 1904  ? ? ?Lipids  ?Lab Results  ?Component Value Date  ? CHOL 141 07/17/2021  ? HDL 31 (L) 07/17/2021  ? [place] 91 07/17/2021  ? TRIG 141 11/05/2021  ? CHOLHDL 4.5 07/17/2021  ? ? ?HbA1c  ?Lab Results  ?Component Value Date  ? HGBA1C 5.8 (H) 07/17/2021  ? ? ?Radiology  ?  ?CT HEAD WO CONTRAST (5MM) ? ?Result Date: 11/03/2021 ?CLINICAL DATA:  Altered mental status EXAM: CT HEAD WITHOUT CONTRAST TECHNIQUE: Contiguous axial images were obtained from the base  of the skull through the vertex without intravenous contrast. RADIATION DOSE REDUCTION: This exam was performed according to the departmental dose-optimization program which includes automated exposure control, adjustment of the mA and/or kV according to patient size and/or use of iterative reconstruction technique. COMPARISON:  None. FINDINGS: Brain: There is no mass, hemorrhage or extra-axial collection. The size and configuration of the ventricles and extra-axial CSF spaces are normal. The brain parenchyma is normal, without acute or chronic infarction. Dilated perivascular space inferior to the left lentiform nucleus. Vascular: No abnormal hyperdensity of the major intracranial arteries or dural venous sinuses. No intracranial atherosclerosis. Skull: The visualized skull base, calvarium and extracranial soft tissues are normal. Sinuses/Orbits: Right ethmoid opacification. Mastoids are clear. The orbits are normal. IMPRESSION: No acute intracranial abnormality. Electronically Signed   By: Ulyses Jarred M.D.   On: 11/03/2021 22:26  ? ?DG Chest Port 1 View ? ?Result Date: 11/04/2021 ?CLINICAL DATA:  Check line placement EXAM: PORTABLE CHEST 1 VIEW COMPARISON:  Film from the previous day. FINDINGS: Cardiac shadow is stable. Left jugular central line is noted in the proximal superior vena cava. No pneumothorax is seen. Endotracheal tube and gastric catheter are noted in satisfactory position. Lungs are well aerated bilaterally. Vascular congestion is again identified as well as some interstitial markings stable from the prior study. IMPRESSION: Tubes and lines in satisfactory position. Otherwise stable appearance of chest. Electronically Signed   By: Inez Catalina M.D.   On: 11/04/2021 02:40  ? ?DG Chest Port 1 View ? ?Result Date: 11/03/2021 ?CLINICAL DATA:  Provided history: Acute respiratory failure. CHF. CAD. COPD. EXAM: PORTABLE CHEST 1 VIEW COMPARISON:  Prior chest radiographs 11/02/2021 and earlier. FINDINGS: Heart  size at upper limits of normal. Central pulmonary vascular congestion. Prominence of the interstitial lung markings throughout both lungs. Mild linear atelectasis within the lateral left lung base. No evidence of pleural effusion or pneumothorax. No acute bony abnormality identified. IMPRESSION: Central pulmonary vascular congestion. Prominence of the interstitial lung markings throughout both lungs, which may reflect interstitial edema. However, atypical/viral infection may have a similar radiographic appearance and clinical correlation is recommended. Mild linear atelectasis within the left lung base. Aortic Atherosclerosis (ICD10-I70.0). Electronically Signed   By: Kellie Simmering D.O.   On: 11/03/2021 11:17  ? ?DG Chest Port 1 View ? ?Result Date: 11/02/2021 ?CLINICAL DATA:  Increased shortness of breath EXAM: PORTABLE CHEST 1 VIEW COMPARISON:  10/05/2021 FINDINGS: Cardiac size is within normal limits. There are no signs of alveolar pulmonary edema or focal pulmonary consolidation. There is no pleural effusion or pneumothorax. IMPRESSION: There  are no signs of pulmonary edema or focal pulmonary consolidation. Electronically Signed   By: Elmer Picker M.D.   On: 11/02/2021 17:48   ? ?Telemetry  ?  ?Converted to sinus rhythm at approximately 12 PM on May 3- Personally Reviewed ? ?Cardiac Studies  ? ?2D Echocardiogram 4.26.2023 ?  ?1. Left ventricular ejection fraction, by estimation, is 50 to 55%. The  ?left ventricle has low normal function. The left ventricle has no regional  ?wall motion abnormalities. Left ventricular diastolic parameters are  ?consistent with Grade I diastolic  ?dysfunction (impaired relaxation).  ? 2. Right ventricular systolic function is normal. The right ventricular  ?size is normal.  ? 3. The mitral valve is normal in structure. Trivial mitral valve  ?regurgitation. No evidence of mitral stenosis.  ? 4. The aortic valve is normal in structure. Aortic valve regurgitation is  ?not  visualized. Aortic valve sclerosis is present, with no evidence of  ?aortic valve stenosis.  ? 5. The inferior vena cava is normal in size with greater than 50%  ?respiratory variability, suggesting right a

## 2021-11-06 NOTE — Consult Note (Signed)
NAME: Virginia Norman  ?DOB: 1961-01-01  ?MRN: [id number]  ?Date/Time: 11/06/2021 6:55 PM ? ?REQUESTING PROVIDER: Dr.Kasa ?Subjective:  ?REASON FOR CONSULT: Fungus and tracheal aspirate culture  ?Chart reviewed.  No history from patient.  Spoke to the intensivist. ?Virginia Norman is a 61 y.o. femalewith a history of COPD/CHF, CAD, HTN admitted on 4/25 with acute sob, cough ?Diagnosed with COPD exacerbation and acute resp failure with hypoxia and hypercapnia ?Tested positive for parainfluenza 3 ?Treated with Diuretics and steroids and nebs and oxygen Transferred to ICU on 4/30.23  for worsening sob on BIPAP , and then became somnolent, co2 narcosis. 5/1 was intubated  ?5/2/central line placement,  ?Patient is currently intubated. ?A culture was sent from the tracheal aspirate on 11/04/2021 and it showed rare fungus probable contaminant/colonizer.  I am asked to see the patient for recommendation. ? ?Past Medical History:  ?Diagnosis Date  ? CHF (congestive heart failure) ([place])   ? COPD (chronic obstructive pulmonary disease) ([place])   ? Coronary artery disease   ? Hypertension   ?  ?Past Surgical History:  ?Procedure Laterality Date  ? ABDOMINAL HYSTERECTOMY    ?  ?Social History  ? ?Socioeconomic History  ? Marital status: Single  ?  Spouse name: Not on file  ? Number of children: Not on file  ? Years of education: Not on file  ? Highest education level: Not on file  ?Occupational History  ? Not on file  ?Tobacco Use  ? Smoking status: Former  ?  Packs/day: 0.75  ?  Years: 44.00  ?  Pack years: 33.00  ?  Types: Cigarettes  ? Smokeless tobacco: Never  ?Vaping Use  ? Vaping Use: Never used  ?Substance and Sexual Activity  ? Alcohol use: Never  ? Drug use: Never  ? Sexual activity: Not on file  ?Other Topics Concern  ? Not on file  ?Social History Narrative  ? Not on file  ? ?Social Determinants of Health  ? ?Financial Resource Strain: Not on file  ?Food Insecurity: Not on file  ?Transportation Needs: Not on file  ?Physical  Activity: Not on file  ?Stress: Not on file  ?Social Connections: Not on file  ?Intimate Partner Violence: Not on file  ?  ?Family History  ?Problem Relation Age of Onset  ? Arrhythmia Mother   ? Hypertension Mother   ? Heart disease Mother   ? Stomach cancer Father   ? Lupus Sister   ? ?No Known Allergies ?I? ?Current Facility-Administered Medications  ?Medication Dose Route Frequency Provider Last Rate Last Admin  ? acetaminophen (TYLENOL) tablet 650 mg  650 mg Oral Q6H PRN Mansy, Jan A, MD      ? Or  ? acetaminophen (TYLENOL) suppository 650 mg  650 mg Rectal Q6H PRN Mansy, Jan A, MD      ? amiodarone (NEXTERONE PREMIX) 360-4.14 MG/200ML-% (1.8 mg/mL) IV infusion  30 mg/hr Intravenous Continuous Theora Gianotti, NP 16.67 mL/hr at 11/06/21 1800 30 mg/hr at 11/06/21 1800  ? Ampicillin-Sulbactam (UNASYN) 3 g in sodium chloride 0.9 % 100 mL IVPB  3 g Intravenous Q6H Kasa, Maretta Bees, MD 200 mL/hr at 11/06/21 1807 3 g at 11/06/21 1807  ? apixaban (ELIQUIS) tablet 5 mg  5 mg Per Tube BID Benita Gutter, RPH   5 mg at 11/06/21 1017  ? chlorhexidine gluconate (MEDLINE KIT) (PERIDEX) 0.12 % solution 15 mL  15 mL Mouth Rinse BID Ottie Glazier, MD   15 mL at 11/06/21 0745  ?  Chlorhexidine Gluconate Cloth 2 % PADS 6 each  6 each Topical Daily Shawna Clamp, MD   6 each at 11/06/21 0919  ? docusate (COLACE) 50 MG/5ML liquid 100 mg  100 mg Per Tube BID Benita Gutter, RPH   100 mg at 11/06/21 8916  ? feeding supplement (VITAL AF 1.2 CAL) liquid 1,000 mL  1,000 mL Per Tube Continuous Flora Lipps, MD 65 mL/hr at 11/06/21 1700 Rate Change at 11/06/21 1700  ? fentaNYL (SUBLIMAZE) bolus via infusion 50-100 mcg  50-100 mcg Intravenous Q1H PRN Lang Snow, NP   100 mcg at 11/06/21 9450  ? fentaNYL 2596mg in NS 2569m(10100mml) infusion-PREMIX  50-200 mcg/hr Intravenous Continuous OumLang SnowP 5 mL/hr at 11/06/21 1800 50 mcg/hr at 11/06/21 1800  ? free water 30 mL  30 mL Per Tube Q4H GonTyler PitaD   30 mL at 11/06/21 1541  ? guaiFENesin (MUCINEX) 12 hr tablet 600 mg  600 mg Oral BID Mansy, Jan A, MD   600 mg at 11/06/21 0913888 hydrALAZINE (APRESOLINE) injection 10-20 mg  10-20 mg Intravenous Q4H PRN NelTeressa LowerP   10 mg at 11/03/21 1005  ? insulin aspart (novoLOG) injection 0-15 Units  0-15 Units Subcutaneous Q4H Rust-Chester, BriHuel CoteP   2 Units at 11/06/21 1553  ? ipratropium-albuterol (DUONEB) 0.5-2.5 (3) MG/3ML nebulizer solution 3 mL  3 mL Nebulization Q6H GonTyler PitaD   3 mL at 11/06/21 1417  ? magnesium hydroxide (MILK OF MAGNESIA) suspension 30 mL  30 mL Oral Daily PRN Mansy, Jan A, MD   30 mL at 10/29/21 2102  ? MEDLINE mouth rinse  15 mL Mouth Rinse 10 times per day AleOttie GlazierD   15 mL at 11/06/21 1831  ? methylPREDNISolone sodium succinate (SOLU-MEDROL) 40 mg/mL injection 40 mg  40 mg Intravenous Q12H Rust-Chester, Britton L, NP   40 mg at 11/06/21 1711  ? metoprolol tartrate (LOPRESSOR) injection 5 mg  5 mg Intravenous Q5 min PRN Furth, Cadence H, PA-C   5 mg at 10/31/21 1355  ? norepinephrine (LEVOPHED) 16 mg in 250m80memix infusion  0-40 mcg/min Intravenous Titrated Rust-Chester, Britton L, NP 11.25 mL/hr at 11/06/21 1800 12 mcg/min at 11/06/21 1800  ? ondansetron (ZOFRAN) tablet 4 mg  4 mg Oral Q6H PRN Mansy, Jan A, MD      ? Or  ? ondansetron (ZOFBuffalo Hospitaljection 4 mg  4 mg Intravenous Q6H PRN Mansy, Jan A, MD   4 mg at 10/29/21 0024  ? pantoprazole sodium (PROTONIX) 40 mg/20 mL oral suspension 40 mg  40 mg Per Tube Daily KeenDarel HongNP   40 mg at 11/06/21 09172800propofol (DIPRIVAN) 1000 MG/100ML infusion  0-50 mcg/kg/min Intravenous Continuous OumaLang Snow 6.98 mL/hr at 11/06/21 1800 15 mcg/kg/min at 11/06/21 1800  ? senna (SENOKOT) tablet 17.2 mg  2 tablet Per Tube BID KasaFlora Lipps      ? sodium chloride flush (NS) 0.9 % injection 10-40 mL  10-40 mL Intracatheter Q12H AlesOttie Glazier   10 mL at 11/06/21 0919  ? sodium  chloride flush (NS) 0.9 % injection 10-40 mL  10-40 mL Intracatheter PRN AlesOttie Glazier      ? vasopressin (PITRESSIN) 20 Units in sodium chloride 0.9 % 100 mL infusion-*FOR SHOCK*  0-0.03 Units/min Intravenous Continuous KeenDarel HongNP 9 mL/hr at 11/06/21 1800 0.03 Units/min at 11/06/21 1800  ?  ? ?  Abtx:  ?Anti-infectives (From admission, onward)  ? ? Start     Dose/Rate Route Frequency Ordered Stop  ? 11/06/21 1230  Ampicillin-Sulbactam (UNASYN) 3 g in sodium chloride 0.9 % 100 mL IVPB       ? 3 g ?200 mL/hr over 30 Minutes Intravenous Every 6 hours 11/06/21 1140    ? 11/05/21 1600  vancomycin (VANCOREADY) IVPB 1750 mg/350 mL       ? 1,750 mg ?175 mL/hr over 120 Minutes Intravenous  Once 11/05/21 1449 11/05/21 1858  ? 11/05/21 1400  vancomycin (VANCOREADY) IVPB 1500 mg/300 mL  Status:  Discontinued       ? 1,500 mg ?150 mL/hr over 120 Minutes Intravenous Every 24 hours 11/04/21 1529 11/04/21 1543  ? 11/04/21 1543  vancomycin variable dose per unstable renal function (pharmacist dosing)  Status:  Discontinued       ?  Does not apply See admin instructions 11/04/21 1543 11/06/21 1140  ? 11/04/21 1400  ceFEPIme (MAXIPIME) 2 g in sodium chloride 0.9 % 100 mL IVPB  Status:  Discontinued       ? 2 g ?200 mL/hr over 30 Minutes Intravenous Every 8 hours 11/04/21 1046 11/06/21 1140  ? 11/04/21 1200  vancomycin (VANCOREADY) IVPB 1750 mg/350 mL       ? 1,750 mg ?175 mL/hr over 120 Minutes Intravenous  Once 11/04/21 1055 11/04/21 1547  ? 10/25/2021 2345  cefTRIAXone (ROCEPHIN) 1 g in sodium chloride 0.9 % 100 mL IVPB  Status:  Discontinued       ? 1 g ?200 mL/hr over 30 Minutes Intravenous Every 24 hours 10/11/2021 2338 10/30/21 1512  ? ?  ? ? ?REVIEW OF SYSTEMS:  ?NA ?Objective:  ?VITALS:  ?BP (!) 179/82 (BP Location: Right Arm)   Pulse 74   Temp 98.6 ?F (37 ?C) (Oral)   Resp 20   Ht _0  (1.702 m)   Wt 77.2 kg   SpO2 93%   BMI 26.66 kg/m?  ?PHYSICAL EXAM:  ?General: Intubated sedated.  Head: Normocephalic,  without obvious abnormality, atraumatic. ?Eyes: Conjunctivae clear, anicteric sclerae. Pupils are equal ?ENT cannot be examined  ?Oral cavity cannot be examined  ?neck: , symmetrical, no adenopathy, thyroid: non

## 2021-11-06 NOTE — Progress Notes (Signed)
? ?NAME:  Virginia Norman, MRN:  [id number], DOB:  1960/10/22, LOS: 9 ?ADMISSION DATE:  10/22/2021, CONSULTATION DATE:  11/03/2021 ?REFERRING MD:  Shawna Clamp MD, CHIEF COMPLAINT:  Severe hypercapnia  ? ?History of Present Illness:  ?Is a 61 year old woman with a medical history of CHF with preserved ejection fraction, COPD and ongoing tobacco abuse, who was admitted on 29 October 2021 with acute on chronic respiratory failure with hypoxia and hypercarbia.  She has been managed in the progressive care unit.  She presented with a working diagnosis of decompensation CHF.  She received diuretics until 4/30.  She was transferred to the intensive care unit due to need for noninvasive ventilation and encephalopathy likely due to her respiratory failure.  She was noted to have severe metabolic derangements to include very low chloride, very high bicarbonate and high PaCO2.  She has significant hemoconcentration on CBC.  We are asked to assist in her management. ? ?Pertinent  Medical History  ?COPD ?CHF with preserved EF ?CAD ?HTN ? ?Significant Hospital Events: ?Including procedures, antibiotic start and stop dates in addition to other pertinent events   ?11/02/2021 transferred to ICU due to severe hypercapnia and severe metabolic derangements ?11/03/2021 requiring AVAPS, stopped all diuretics, Diamox, sodium chloride infusion ?11/03/2021 patient required intubation in the evening CT head negative for acute CNS process ?11/04/2021 on mechanical ventilation ?5/3 remains on vent plan for weaning trial ?5/4 remains on vent severe COPD ? ?Interim History / Subjective:  ?Remains on vent ?Remains critically ill ?Remains intubated ?Severe COPD exacerbation ?Has END STAGE COPD ?Plan for SAT/SBT today ? ?Objective   ?Blood pressure (!) 177/80, pulse 72, temperature (!) 97.2 ?F (36.2 ?C), temperature source Oral, resp. rate 18, height _0  (1.702 m), weight 77.2 kg, SpO2 90 %. ?CVP:  [9 mmHg-19 mmHg] 19 mmHg  ?Vent Mode: PRVC ?FiO2 (%):   [40 %] 40 % ?Set Rate:  [20 bmp] 20 bmp ?Vt Set:  [450 mL] 450 mL ?PEEP:  [8 cmH20] 8 cmH20 ?Plateau Pressure:  [21 cmH20-23 cmH20] 23 cmH20  ? ?Intake/Output Summary (Last 24 hours) at 11/06/2021 0755 ?Last data filed at 11/06/2021 0554 ?Gross per 24 hour  ?Intake 6486.63 ml  ?Output 1850 ml  ?Net 4636.63 ml  ? ? ?Filed Weights  ? 10/21/2021 1845 11/02/21 1847 11/06/21 0500  ?Weight: 81.2 kg 77.5 kg 77.2 kg  ? ? ?REVIEW OF SYSTEMS ? ?PATIENT IS UNABLE TO PROVIDE COMPLETE REVIEW OF SYSTEMS DUE TO SEVERE CRITICAL ILLNESS AND TOXIC METABOLIC ENCEPHALOPATHY ? ? ? ?PHYSICAL EXAMINATION: ? ?GENERAL:critically ill appearing, +resp distress ?EYES: Pupils equal, round, reactive to light.  No scleral icterus.  ?MOUTH: Moist mucosal membrane. INTUBATED ?NECK: Supple.  ?PULMONARY: +rhonchi, +wheezing ?CARDIOVASCULAR: S1 and S2.  No murmurs  ?GASTROINTESTINAL: Soft, nontender, -distended. Positive bowel sounds.  ?MUSCULOSKELETAL: No swelling, clubbing, or edema.  ?NEUROLOGIC: obtunded ?SKIN:intact,warm,dry ? ? ? ?Resolved Hospital Problem list   ?N/A ? 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 ? ?Severe ACUTE Hypoxic and Hypercapnic Respiratory Failure ?-continue Mechanical Ventilator support ?-Wean Fio2 and PEEP as tolerated ?-VAP/VENT bundle implementation ?- Wean PEEP & FiO2 as tolerated, maintain SpO2 >  88% ?- Head of bed elevated 30 degrees, VAP protocol in place ?- Plateau pressures less than 30 cm H20  ?- Intermittent chest x-ray & ABG PRN ?- Ensure adequate pulmonary hygiene   ?-will perform SAT/SBT when respiratory parameters are met ? ?SEVERE COPD EXACERBATION ?-continue IV steroids as prescribed ?-continue NEB THERAPY as prescribed ?-morphine as needed ?-wean fio2 as needed and tolerated ? ? ?ACUTE  CARDIAC FAILURE-due to Paroxysmal atrial fibrillationF ?-oxygen as needed ?-Lasix as tolerated ? ? ?SEPTIC/hypovolemia  shock ?SOURCE-pneumonia ?-use vasopressors to keep MAP>65 as needed ?-follow ABG and LA as needed ?-follow up cultures ?-emperic ABX ? ?INFECTIOUS DISEASE ?-continue antibiotics as prescribed ?-follow up cultures ?Fever, leukocytosis ?Copious thick secretions/mucous plugs ? ? ? ?NEUROLOGY ?ACUTE TOXIC METABOLIC ENCEPHALOPATHY ?WUA today ? ? ?ENDO ?- ICU hypoglycemic\Hyperglycemia protocol ?-check FSBS per protocol ? ? ?GI ?GI PROPHYLAXIS as indicated ? ?NUTRITIONAL STATUS ?DIET-->TF's as tolerated ?Constipation protocol as indicated ? ? ?ELECTROLYTES ?-follow labs as needed ?-replace as needed ?-pharmacy consultation and following ? ? ? ?Best Practice (right click and "Reselect all SmartList Selections" daily)  ? ?Diet/type: NPO ?DVT prophylaxis: DOAC ?GI prophylaxis: N/A ?Lines: Central line and Arterial Line 5/02 ?Foley:  Yes, and it is still needed ?Code Status:  full code ? ? ?Labs   ?CBC: ?Recent Labs  ?Lab 11/02/21 ?9357 11/03/21 ?0177 11/04/21 ?9390 11/06/21 ?3009  ?WBC 9.3 13.4* 21.5* 15.8*  ?NEUTROABS  --   --  18.0*  --   ?HGB 16.1* 15.3* 13.6 10.9*  ?HCT 52.7* 51.1* 45.1 36.2  ?MCV 93.3 95.7 96.6 94.8  ?PLT 279 314 300 208  ? ? ? ?Basic Metabolic Panel: ?Recent Labs  ?Lab 11/02/21 ?2330 11/03/21 ?0762 11/03/21 ?1437 11/04/21 ?2633 11/05/21 ?3545 11/06/21 ?6256  ?NA 137 139 135 139 134* 135  ?K 4.1 4.5 4.4 4.5 4.1 4.4  ?CL 80* 80* 78* 90* 91* 93*  ?CO2 >45* >45* >45* 43* 38* 39*  ?GLUCOSE 137* 135* 247* 158* 169* 144*  ?BUN 23 29* 37* 43* 38* 34*  ?CREATININE 0.51 0.58 0.90 1.04* 0.59 0.60  ?CALCIUM 8.8* 9.0 9.1 8.1* 7.7* 7.2*  ?MG 2.8* 2.3  --  2.5* 2.4 2.0   ?PHOS 6.0* 5.0*  --  2.4* 1.5* 2.8  ? ? ?GFR: ?Estimated Creatinine Clearance: 78 mL/min (by C-G formula based on SCr of 0.6 mg/dL). ?Recent Labs  ?Lab 11/02/21 ?3893 11/03/21 ?7342 11/04/21 ?8768 11/06/21 ?1157  ?WBC 9.3 13.4* 21.5* 15.8*  ? ? ? ?ABG ?   ?Component Value Date/Time  ? PHART 7.35 11/06/2021 0457  ? PCO2ART 76 ([facility]) 11/06/2021 0457  ? PO2ART 102 11/06/2021 0457  ? HCO3 42.0 (H) 11/06/2021 0457  ? O2SAT 98.9 11/06/2021 0457  ? ?  ?HbA1C: ?Hgb A1c MFr Bld  ?Date/Time Value Ref Range Status  ?07/17/2021 06:23 AM 5.8 (H) 4.8 - 5.6 % Final  ?  Comment:  ?  (NOTE) ?Pre diabetes:          5.7%-6.4% ? ?Diabetes:              >6.4% ? ?Glycemic control for   <7.0% ?adults with diabetes ?  ?10/03/2019 04:45 AM 6.3 (H) 4.8 - 5.6 % Final  ?  Comment:  ?  (NOTE) ?Pre diabetes:          5.7%-6.4% ?Diabetes:              >6.4% ?Glycemic control for   <7.0% ?adults with diabetes ?  ? ? ?CBG: ?Recent Labs  ?  Lab 11/02/21 ?1850 11/05/21 ?2335 11/06/21 ?8638 11/06/21 ?1771  ?GLUCAP 155* 158* 153* 150*  ? ? ? ? ?Home Medications  ?Prior to Admission medications   ?Medication Sig Start Date End Date Taking? Authorizing Provider  ?albuterol (VENTOLIN HFA) 108 (90 Base) MCG/ACT inhaler Inhale 1-2 puffs into the lungs every 6 (six) hours as needed for wheezing or shortness of breath. 09/01/21  Yes Darylene Price A, FNP  ?aspirin EC 81 MG tablet Take 81 mg by mouth daily.   Yes [provider]  ?empagliflozin (JARDIANCE) 10 MG TABS tablet Take 1 tablet (10 mg total) by mouth daily before breakfast. 07/30/21  Yes Alisa Graff, FNP  ?mometasone-formoterol (DULERA) 100-5 MCG/ACT AERO Inhale 2 puffs into the lungs in the morning and at bedtime. 07/22/21  Yes Dessa Phi, DO  ?Multiple Vitamins-Minerals (MULTIVITAMIN WITH MINERALS) tablet Take 1 tablet by mouth daily.   Yes [provider]  ?potassium chloride SA (KLOR-CON M) 20 MEQ tablet Take 1 tablet (20 mEq total) by mouth daily. 09/02/21  Yes Alisa Graff,  FNP  ?tiotropium (SPIRIVA) 18 MCG inhalation capsule Place 18 mcg into inhaler and inhale daily.   Yes [provider]  ?hydrochlorothiazide (HYDRODIURIL) 12.5 MG tablet Take 1 tablet (12.5 mg total

## 2021-11-06 NOTE — Consult Note (Signed)
PHARMACY CONSULT NOTE ? ?Pharmacy Consult for Electrolyte Monitoring and Replacement  ? ?Recent Labs: ?Potassium (mmol/L)  ?Date Value  ?11/06/2021 4.4  ?04/05/2014 4.6  ? ?Magnesium (mg/dL)  ?Date Value  ?11/06/2021 2.0  ? ?Calcium (mg/dL)  ?Date Value  ?11/06/2021 7.2 (L)  ? ?Calcium, Total (mg/dL)  ?Date Value  ?04/05/2014 8.2 (L)  ? ?Albumin (g/dL)  ?Date Value  ?11/04/2021 3.0 (L)  ? ?Phosphorus (mg/dL)  ?Date Value  ?11/06/2021 2.8  ? ?Sodium (mmol/L)  ?Date Value  ?11/06/2021 135  ?04/05/2014 141  ? ?Assessment: ?Patient is a 61 y.o. female with medical history including HFpEF, COPD, tobacco use disorder admitted on 10/16/2021 with acute on chronic respiratory failure. Admission complicated by severe metabolic derangements requiring transfer to ICU and ultimately intubation on 5/2. Pharmacy consulted to assist with electrolyte monitoring and replacement as indicated. ? ?MIVF: NS at 75 cc/hr (started for diuretic induced contraction alkalosis) >> stopped 5/3 ?Nutrition: Tube feeds started 5/2 ? ?Goal of Therapy:  ?Electrolytes within normal limits ? ?Plan:  ?--Hypophosphatemia resolved. Discontinue phosphorous supplement ?--Follow-up electrolytes with AM labs tomorrow ? ?Tressie Ellis ?11/06/2021 9:55 AM  ?

## 2021-11-07 ENCOUNTER — Inpatient Hospital Stay: Payer: Self-pay

## 2021-11-07 LAB — BLOOD GAS, ARTERIAL
Acid-Base Excess: 12.9 mmol/L — ABNORMAL HIGH (ref 0.0–2.0)
Bicarbonate: 41.6 mmol/L — ABNORMAL HIGH (ref 20.0–28.0)
MECHVT: 450 mL
Mechanical Rate: 20
O2 Saturation: 97.9 %
PEEP: 8 cmH2O
Patient temperature: 37
pCO2 arterial: 72 mmHg (ref 32–48)
pH, Arterial: 7.37 (ref 7.35–7.45)
pO2, Arterial: 109 mmHg — ABNORMAL HIGH (ref 83–108)

## 2021-11-07 LAB — GLUCOSE, CAPILLARY
Glucose-Capillary: 179 mg/dL — ABNORMAL HIGH (ref 70–99)
Glucose-Capillary: 184 mg/dL — ABNORMAL HIGH (ref 70–99)
Glucose-Capillary: 194 mg/dL — ABNORMAL HIGH (ref 70–99)
Glucose-Capillary: 194 mg/dL — ABNORMAL HIGH (ref 70–99)
Glucose-Capillary: 196 mg/dL — ABNORMAL HIGH (ref 70–99)
Glucose-Capillary: 197 mg/dL — ABNORMAL HIGH (ref 70–99)
Glucose-Capillary: 226 mg/dL — ABNORMAL HIGH (ref 70–99)

## 2021-11-07 LAB — BASIC METABOLIC PANEL
Anion gap: 8 (ref 5–15)
BUN: 52 mg/dL — ABNORMAL HIGH (ref 8–23)
CO2: 37 mmol/L — ABNORMAL HIGH (ref 22–32)
Calcium: 7.9 mg/dL — ABNORMAL LOW (ref 8.9–10.3)
Chloride: 91 mmol/L — ABNORMAL LOW (ref 98–111)
Creatinine, Ser: 0.76 mg/dL (ref 0.44–1.00)
GFR, Estimated: >60 mL/min (ref >60)
Glucose, Bld: 202 mg/dL — ABNORMAL HIGH (ref 70–99)
Potassium: 4.7 mmol/L (ref 3.5–5.1)
Sodium: 136 mmol/L (ref 135–145)

## 2021-11-07 LAB — CBC
HCT: 33.2 % — ABNORMAL LOW (ref 36.0–46.0)
Hemoglobin: 10.2 g/dL — ABNORMAL LOW (ref 12.0–15.0)
MCH: 29.1 pg (ref 26.0–34.0)
MCHC: 30.7 g/dL (ref 30.0–36.0)
MCV: 94.6 fL (ref 80.0–100.0)
Platelets: 252 10*3/uL (ref 150–400)
RBC: 3.51 MIL/uL — ABNORMAL LOW (ref 3.87–5.11)
RDW: 13.7 % (ref 11.5–15.5)
WBC: 16.6 10*3/uL — ABNORMAL HIGH (ref 4.0–10.5)
nRBC: 1 % — ABNORMAL HIGH (ref 0.0–0.2)

## 2021-11-07 LAB — PHOSPHORUS: Phosphorus: 2.7 mg/dL (ref 2.5–4.6)

## 2021-11-07 MED ORDER — AMIODARONE HCL IN DEXTROSE 360-4.14 MG/200ML-% IV SOLN
15.0000 mg/h | INTRAVENOUS | Status: DC
  Administered 2021-11-07 – 2021-11-10 (×8): 60 mg/h via INTRAVENOUS
  Administered 2021-11-10 (×2): 15 mg/h via INTRAVENOUS
  Filled 2021-11-07 (×11): qty 200

## 2021-11-07 MED ORDER — FUROSEMIDE 10 MG/ML IJ SOLN
20.0000 mg | Freq: Once | INTRAMUSCULAR | Status: AC
  Administered 2021-11-07: 20 mg via INTRAVENOUS
  Filled 2021-11-07: qty 2

## 2021-11-07 MED ORDER — MIDAZOLAM HCL 2 MG/2ML IJ SOLN: 2.0000 mg | Freq: Once | INTRAMUSCULAR | Status: AC

## 2021-11-07 MED ORDER — POLYETHYLENE GLYCOL 3350 17 G PO PACK
17.0000 g | PACK | Freq: Every day | ORAL | Status: DC
  Administered 2021-11-07 – 2021-11-10 (×4): 17 g via ORAL
  Filled 2021-11-07 (×4): qty 1

## 2021-11-07 MED ORDER — MIDAZOLAM HCL 2 MG/2ML IJ SOLN
INTRAMUSCULAR | Status: AC
Start: 2021-11-07 — End: 2021-11-07
  Administered 2021-11-07: 2 mg via INTRAVENOUS
  Filled 2021-11-07: qty 2

## 2021-11-07 NOTE — Progress Notes (Signed)
? ?Cardiology Progress Note  ? ?Patient Name: Virginia Norman ?Date of Encounter: 11/07/2021 ? ?Primary Cardiologist: Ida Rogue, MD ? ?Subjective  ? ?The patient had worsening respiratory failure and went back into atrial fibrillation.  She had bronchoscopy done which showed large mucous plug which was aspirated.  She did have tachycardia and was given a dose of IV metoprolol but ventricular rate is now in the 90s and low 100. ? ?Inpatient Medications  ?  ?Scheduled Meds: ? apixaban  5 mg Per Tube BID  ? chlorhexidine gluconate (MEDLINE KIT)  15 mL Mouth Rinse BID  ? Chlorhexidine Gluconate Cloth  6 each Topical Daily  ? docusate  100 mg Per Tube BID  ? free water  30 mL Per Tube Q4H  ? guaiFENesin  600 mg Oral BID  ? insulin aspart  0-15 Units Subcutaneous Q4H  ? ipratropium-albuterol  3 mL Nebulization Q6H  ? mouth rinse  15 mL Mouth Rinse 10 times per day  ? methylPREDNISolone (SOLU-MEDROL) injection  40 mg Intravenous Q12H  ? pantoprazole sodium  40 mg Per Tube Daily  ? polyethylene glycol  17 g Oral Daily  ? senna  2 tablet Per Tube BID  ? sodium chloride flush  10-40 mL Intracatheter Q12H  ? ?Continuous Infusions: ? amiodarone 30 mg/hr (11/07/21 1100)  ? ampicillin-sulbactam (UNASYN) IV Stopped (11/07/21 0602)  ? feeding supplement (VITAL AF 1.2 CAL) 1,000 mL (11/07/21 0740)  ? fentaNYL infusion INTRAVENOUS 75 mcg/hr (11/07/21 1100)  ? norepinephrine (LEVOPHED) Adult infusion 16 mcg/min (11/07/21 1152)  ? propofol (DIPRIVAN) infusion 20 mcg/kg/min (11/07/21 1100)  ? vasopressin 0.03 Units/min (11/07/21 1100)  ? ?PRN Meds: ?acetaminophen **OR** acetaminophen, fentaNYL, hydrALAZINE, magnesium hydroxide, metoprolol tartrate, ondansetron **OR** ondansetron (ZOFRAN) IV, sodium chloride flush  ? ?Vital Signs  ?  ?Vitals:  ? 11/07/21 1100 11/07/21 1115 11/07/21 1130 11/07/21 1145  ?BP: (!) 89/55 135/75 (!) 141/85 (!) 86/60  ?Pulse: 97 (!) 101 (!) 101 98  ?Resp: (!) 22 (!) 21 (!) 23 20  ?Temp:      ?TempSrc:       ?SpO2: 95% 96% 99% 94%  ?Weight:      ?Height:      ? ? ?Intake/Output Summary (Last 24 hours) at 11/07/2021 1203 ?Last data filed at 11/07/2021 1100 ?Gross per 24 hour  ?Intake 3343.15 ml  ?Output 1725 ml  ?Net 1618.15 ml  ? ? ?Filed Weights  ? 11/02/21 1847 11/06/21 0500 11/07/21 0428  ?Weight: 77.5 kg 77.2 kg 87.9 kg  ? ? ?Physical Exam  ? ?GEN: Well nourished, well developed, in no acute distress.  ?HEENT: Grossly normal.  ?Neck: Supple, no JVD, carotid bruits, or masses. ?Cardiac: Irregularly irregular, 2/6 systolic murmur throughout-loudest at the left lower sternal border to apex.  No rubs or gallops. No clubbing, cyanosis, edema.  Radials 2+, DP/PT 2+ and equal bilaterally.  ?Respiratory: Diminished breath sounds bilaterally. ?GI: Soft, nontender, nondistended, BS + x 4. ?MS: no deformity or atrophy. ?Skin: warm and dry, no rash. ?Neuro:  Strength and sensation are intact. ?Psych: AAOx3.  Normal affect. ? ?Labs  ?  ?Chemistry ?Recent Labs  ?Lab 11/04/21 ?8119 11/05/21 ?1478 11/06/21 ?2956 11/07/21 ?2130  ?NA 139 134* 135 136  ?K 4.5 4.1 4.4 4.7  ?CL 90* 91* 93* 91*  ?CO2 43* 38* 39* 37*  ?GLUCOSE 158* 169* 144* 202*  ?BUN 43* 38* 34* 52*  ?CREATININE 1.04* 0.59 0.60 0.76  ?CALCIUM 8.1* 7.7* 7.2* 7.9*  ?ALBUMIN 3.0*  --   --   --   ?  GFRNONAA >60 >60 >60 >60  ?ANIONGAP 6 5 3* 8  ? ?  ? ?Hematology ?Recent Labs  ?Lab 11/04/21 ?0611 11/06/21 ?1638 11/07/21 ?4665  ?WBC 21.5* 15.8* 16.6*  ?RBC 4.67 3.82* 3.51*  ?HGB 13.6 10.9* 10.2*  ?HCT 45.1 36.2 33.2*  ?MCV 96.6 94.8 94.6  ?MCH 29.1 28.5 29.1  ?MCHC 30.2 30.1 30.7  ?RDW 12.8 13.3 13.7  ?PLT 300 208 252  ? ? ? ?Cardiac Enzymes  ?Recent Labs  ?Lab 10/23/2021 ?1904 10/16/2021 ?2118  ?TROPONINIHS 18* 19*  ? ?   ? ?BNP ?   ?Component Value Date/Time  ? BNP 129.0 (H) 11/01/2021 1904  ? ? ?Lipids  ?Lab Results  ?Component Value Date  ? CHOL 141 07/17/2021  ? HDL 31 (L) 07/17/2021  ? [place] 91 07/17/2021  ? TRIG 141 11/05/2021  ? CHOLHDL 4.5 07/17/2021  ? ? ?HbA1c  ?Lab  Results  ?Component Value Date  ? HGBA1C 5.8 (H) 07/17/2021  ? ? ?Radiology  ?  ?DG Chest 1 View ? ?Result Date: 11/07/2021 ?CLINICAL DATA:  Check central line placement EXAM: PORTABLE CHEST 1 VIEW COMPARISON:  11/04/2021 FINDINGS: Cardiac shadow is stable. Endotracheal tube, gastric catheter and left jugular central line are again seen and stable. No pneumothorax is noted. Increasing airspace opacity is noted particularly in the right lung consistent with worsening edema. Superimposed infiltrate may present in this fashion as well. No bony abnormality is seen. IMPRESSION: Tubes and lines as described above. Increasing airspace opacity particularly on the right consistent with worsening edema. Superimposed infiltrate could not be totally excluded. Electronically Signed   By: Inez Catalina M.D.   On: 11/07/2021 01:16  ? ?CT HEAD WO CONTRAST (5MM) ? ?Result Date: 11/03/2021 ?CLINICAL DATA:  Altered mental status EXAM: CT HEAD WITHOUT CONTRAST TECHNIQUE: Contiguous axial images were obtained from the base of the skull through the vertex without intravenous contrast. RADIATION DOSE REDUCTION: This exam was performed according to the departmental dose-optimization program which includes automated exposure control, adjustment of the mA and/or kV according to patient size and/or use of iterative reconstruction technique. COMPARISON:  None. FINDINGS: Brain: There is no mass, hemorrhage or extra-axial collection. The size and configuration of the ventricles and extra-axial CSF spaces are normal. The brain parenchyma is normal, without acute or chronic infarction. Dilated perivascular space inferior to the left lentiform nucleus. Vascular: No abnormal hyperdensity of the major intracranial arteries or dural venous sinuses. No intracranial atherosclerosis. Skull: The visualized skull base, calvarium and extracranial soft tissues are normal. Sinuses/Orbits: Right ethmoid opacification. Mastoids are clear. The orbits are normal.  IMPRESSION: No acute intracranial abnormality. Electronically Signed   By: Ulyses Jarred M.D.   On: 11/03/2021 22:26  ? ?DG Chest Port 1 View ? ?Result Date: 11/04/2021 ?CLINICAL DATA:  Check line placement EXAM: PORTABLE CHEST 1 VIEW COMPARISON:  Film from the previous day. FINDINGS: Cardiac shadow is stable. Left jugular central line is noted in the proximal superior vena cava. No pneumothorax is seen. Endotracheal tube and gastric catheter are noted in satisfactory position. Lungs are well aerated bilaterally. Vascular congestion is again identified as well as some interstitial markings stable from the prior study. IMPRESSION: Tubes and lines in satisfactory position. Otherwise stable appearance of chest. Electronically Signed   By: Inez Catalina M.D.   On: 11/04/2021 02:40   ? ?Telemetry  ?  ?Converted to sinus rhythm at approximately 12 PM on May 3- Personally Reviewed ? ?Cardiac Studies  ? ?2D Echocardiogram 4.26.2023 ?  ?  1. Left ventricular ejection fraction, by estimation, is 50 to 55%. The  ?left ventricle has low normal function. The left ventricle has no regional  ?wall motion abnormalities. Left ventricular diastolic parameters are  ?consistent with Grade I diastolic  ?dysfunction (impaired relaxation).  ? 2. Right ventricular systolic function is normal. The right ventricular  ?size is normal.  ? 3. The mitral valve is normal in structure. Trivial mitral valve  ?regurgitation. No evidence of mitral stenosis.  ? 4. The aortic valve is normal in structure. Aortic valve regurgitation is  ?not visualized. Aortic valve sclerosis is present, with no evidence of  ?aortic valve stenosis.  ? 5. The inferior vena cava is normal in size with greater than 50%  ?respiratory variability, suggesting right atrial pressure of 3 mmHg.  ?_____________  ? ?Patient Profile  ?   ?61 y.o. female with history of HFpEF, COPD, and HTN admitted with acute on chronic hypoxic and hypercapnic respiratory failure secondary to COPD  exacerbation and parainfluenza who we are seeing for Afib with RVR. ? ?Assessment & Plan  ?  ?1.  Paroxysmal atrial fibrillation/flutter: Patient with recurrent A-fib/flutter.  The patient went back into atrial fibr

## 2021-11-07 NOTE — Progress Notes (Signed)
0710: Bedside report with PM RN. Patient in bed resting, BP fluctuating with no stimulation/adjustments/changes which has been ongoing.  ? ?81: Patient in afib, rate controlled at this time. BP continues to be labile.  ? ?0915: Patient medicated (per MAR) TF residual 250cc which was reefed to patient. Dietitian made aware, and not to recheck any residuals on TF.  ? ?1145: MD discussed bronch with patient family and obtained consent. MD & RT completed bedside bronchoscopy without complication.  ? ?1500: Patient given CHG bath with full linen change, while turning patient saturating 86-88% HR afib 120-140's & hypertensive. Arterial line leaked and dressing changed, waveform and arterial line still WNL.  ? ?1550: MD rounding on patient, made aware that afib has become uncontrolled and required PRN metoprolol as BP has been elevated as well. Per MD amio adjusted for 24 hours and reevaluated.  ? ?1835: Patient appears comfortable (RASS -2/-3), husband bedside. Patient repositioned and oral care provided q2hr throughout this shift.  ?

## 2021-11-07 NOTE — IPAL (Signed)
?  Interdisciplinary Goals of Care Family Meeting ? ? ?Date carried out: 11/07/2021 ? ?Location of the meeting: Bedside ? ?Member's involved: Physician, Bedside Registered Nurse, and Family Member or next of kin ? ? ? ? ? ? ?GOALS OF CARE DISCUSSION ? ?The Clinical status was relayed to family in detail- ? ?Updated and notified of patients medical condition- ?Patient remains unresponsive and will not open eyes to command.   ?Patient is having a weak cough and struggling to remove secretions.   ?Patient with increased WOB and using accessory muscles to breathe ?Explained to family course of therapy and the modalities  ? ?Patient with Progressive multiorgan failure with a very high probablity of a very minimal chance of meaningful recovery despite all aggressive and optimal medical therapy.  ?PATIENT REMAINS FULL CODE ? ?Plan for [facility] ?Consider Thoracentesis ? ?Family understands the situation. ? ? ?Family are satisfied with Plan of action and management. All questions answered ? ?Additional CC time 25 mins ? ? ?Lucie Leather, M.D.  ?[facility]  ?Medical Director [facility] ?Medical Director [facility] Cardio-P[facility] Department  ? ? ?

## 2021-11-07 NOTE — TOC Progression Note (Signed)
Transition of Care (TOC) - Progression Note  ? ? ?Patient Details  ?Name: Virginia Norman ?MRN: [id number] ?Date of Birth: 1961/05/27 ? ?Transition of Care (TOC) CM/SW Contact  ?Shelbie Hutching, RN ?Phone Number: ?11/07/2021, 4:34 PM ? ?Clinical Narrative:    ?Patient intubated and sedated in the ICU, Bronchoscopy today.   ?TOC will follow.  ? ? ?Expected Discharge Plan: [place] ?Barriers to Discharge: Continued Medical Work up ? ?Expected Discharge Plan and Services ?Expected Discharge Plan: [place] ?  ?Discharge Planning Services: CM Consult ?  ?Living arrangements for the past 2 months: [place] ?                ?  ?  ?  ?  ?  ?  ?  ?  ?  ?  ? ? ?Social Determinants of Health (SDOH) Interventions ?  ? ?Readmission Risk Interventions ?   ? View : No data to display.  ?  ?  ?  ? ? ?

## 2021-11-07 NOTE — Progress Notes (Signed)
Patient noted with increased Peak pressures >38 on the vent with vent dyssynchrony not improved with sedation or suctioning. Unable to perform inspiratory hold due to pt instability. Bronchodilators administered, ABG and chest xray obtained to evaluate for possible pneumothorax, worsening pulmonary edema, ARDS or tube misplacement. ? ?Arterial Blood Gas result:  pO2 109; pCO2 72; pH 7.37;  HCO3 41.6, %O2 Sat 97.9. ?Chest Xray showedIncreasing airspace opacity particularly on the right consistent with worsening edema. Superimposed infiltrate could not be totally excluded. ? ?  ?Acute Hypoxic Hypercapnic Respiratory Failure in the setting of AECOPD due to Parainfluenza 3 s/p mucus plug  & Flash Pulmonary Edema ?Now with worsening respiratory symptoms on chest xray, Amiodarone lung toxicity in the differentials ?-Wean PEEP and FiO2 for sats greater than 90% ?-Plateau pressures less than 30 cm H20 ?-Con't LTVV, Vt decreased due to high Pplat and rate increased.  ?-Vecuronium PRN for significant vent dyssynchrony ?-IV Lasix 20 mg x 1 dose ?-PRN and scheduled Bronchodilators ?-VAP bundle in place ?-Intermittent chest x-ray & ABG ?-F/u cultures, trend PCT ?-Continue Aspiration Pna coverage: Unasyn ?-Steroids ?-wean sedation/analgesia for RASS goal 0 ? ? ? ?[name], DNP, CCRN, FNP-C, AGACNP-BC ?Acute Care & Family Nurse Practitioner  ?[facility]  ?See Amion for personal pager ?PCCM on call pager [phone] until 7 am ? ? ? ?

## 2021-11-07 NOTE — Progress Notes (Signed)
? ?NAME:  Virginia Norman, MRN:  [id number], DOB:  06-02-1961, LOS: 10 ?ADMISSION DATE:  10/21/2021, CONSULTATION DATE:  11/03/2021 ?REFERRING MD:  Shawna Clamp MD, CHIEF COMPLAINT:  Severe hypercapnia  ? ?History of Present Illness:  ?Is a 61 year old woman with a medical history of CHF with preserved ejection fraction, COPD and ongoing tobacco abuse, who was admitted on 29 October 2021 with acute on chronic respiratory failure with hypoxia and hypercarbia.  She has been managed in the progressive care unit.  She presented with a working diagnosis of decompensation CHF.  She received diuretics until 4/30.  She was transferred to the intensive care unit due to need for noninvasive ventilation and encephalopathy likely due to her respiratory failure.  She was noted to have severe metabolic derangements to include very low chloride, very high bicarbonate and high PaCO2.  She has significant hemoconcentration on CBC.  We are asked to assist in her management. ? ?Pertinent  Medical History  ?COPD ?CHF with preserved EF ?CAD ?HTN ? ?Significant Hospital Events: ?Including procedures, antibiotic start and stop dates in addition to other pertinent events   ?11/02/2021 transferred to ICU due to severe hypercapnia and severe metabolic derangements ?11/03/2021 requiring AVAPS, stopped all diuretics, Diamox, sodium chloride infusion ?11/03/2021 patient required intubation in the evening CT head negative for acute CNS process ?11/04/2021 on mechanical ventilation ?5/3 remains on vent plan for weaning trial ?5/4 remains on vent severe COPD ?5/5 failure to wean from vent, failed SAT/SBT ? ?Interim History / Subjective:  ?Failed SAT/SBT ?Severe resp distress and hypoxic resp failure ?Remains on vent ?End stage COPD ?Severe COPD exacerbation ?CXR shows RT sided opacity ?Plan for bronch ? ? ?Objective   ?Blood pressure (!) 163/78, pulse 68, temperature 99.2 ?F (37.3 ?C), temperature source Axillary, resp. rate 20, height _0  (1.702 m),  weight 87.9 kg, SpO2 94 %. ?CVP:  [5 mmHg-19 mmHg] 13 mmHg  ?Vent Mode: PRVC ?FiO2 (%):  [40 %-60 %] 60 % ?Set Rate:  [20 bmp] 20 bmp ?Vt Set:  [450 mL] 450 mL ?PEEP:  [8 cmH20] 8 cmH20 ?Plateau Pressure:  [25 cmH20-26 cmH20] 26 cmH20  ? ?Intake/Output Summary (Last 24 hours) at 11/07/2021 0735 ?Last data filed at 11/07/2021 0600 ?Gross per 24 hour  ?Intake 3194.28 ml  ?Output 1975 ml  ?Net 1219.28 ml  ? ? ?Filed Weights  ? 11/02/21 1847 11/06/21 0500 11/07/21 0428  ?Weight: 77.5 kg 77.2 kg 87.9 kg  ? ? ?REVIEW OF SYSTEMS ? ?PATIENT IS UNABLE TO PROVIDE COMPLETE REVIEW OF SYSTEMS DUE TO SEVERE CRITICAL ILLNESS AND TOXIC METABOLIC ENCEPHALOPATHY ? ? ? ?PHYSICAL EXAMINATION: ? ?GENERAL:critically ill appearing, +resp distress ?EYES: Pupils equal, round, reactive to light.  No scleral icterus.  ?MOUTH: Moist mucosal membrane. INTUBATED ?NECK: Supple.  ?PULMONARY: +rhonchi, +wheezing ?CARDIOVASCULAR: S1 and S2.  No murmurs  ?GASTROINTESTINAL: Soft, nontender, -distended. Positive bowel sounds.  ?MUSCULOSKELETAL: No swelling, clubbing, or edema.  ?NEUROLOGIC: obtunded ?SKIN:intact,warm,dry ? ? 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 ?Severe ACUTE Hypoxic and Hypercapnic Respiratory Failure ?-continue Mechanical Ventilator support ?-Wean Fio2 and PEEP as tolerated ?-VAP/VENT bundle implementation ?- Wean PEEP &  FiO2 as tolerated, maintain SpO2 > 88% ?- Head of bed elevated 30 degrees, VAP protocol in place ?- Plateau pressures less than 30 cm H20  ?- Intermittent chest x-ray &  ABG PRN ?- Ensure adequate pulmonary hygiene  ?-will NOT  perform SAT/SBT  ? ? ?SEVERE COPD EXACERBATION ?-continue IV steroids as prescribed ?-continue NEB THERAPY as prescribed ?-morphine as needed ?-wean fio2 as needed and tolerated ? ? ?ACUTE  CARDIAC FAILURE-due to Paroxysmal atrial fibrillationF ?-oxygen as needed ?-Lasix as tolerated ? ?SEPTIC shock hypovolemia  shock ?SOURCE-pneumonia ?-use vasopressors to keep MAP>65 as needed ?-follow ABG and LA as needed ?-follow up cultures ? ?INFECTIOUS DISEASE ?-continue antibiotics as prescribed ?-follow up cultures ?Plan for Bronch BAL ?Copious thick secretions/mucous plugs ? ? ?NEUROLOGY ?ACUTE TOXIC METABOLIC ENCEPHALOPATHY SEVERE  HYPOXIA ?-need for sedation ?-Goal RASS -2 to -3 ? ? ?ENDO ?- ICU hypoglycemic\Hyperglycemia protocol ?-check FSBS per protocol ? ? ?GI ?GI PROPHYLAXIS as indicated ? ?NUTRITIONAL STATUS ?DIET-->TF's as tolerated ?Constipation protocol as indicated ? ? ?ELECTROLYTES ?-follow labs as needed ?-replace as needed ?-pharmacy consultation and following ? ? ? ?Best Practice (right click and "Reselect all SmartList Selections" daily)  ? ?Diet/type: NPO ?DVT prophylaxis: DOAC ?GI prophylaxis: N/A ?Lines: Central line and Arterial Line 5/02 ?Foley:  Yes, and it is still needed ?Code Status:  full code ? ? ?Labs   ?CBC: ?Recent Labs  ?Lab 11/02/21 ?1779 11/03/21 ?3903 11/04/21 ?0092 11/06/21 ?3300 11/07/21 ?7622  ?WBC 9.3 13.4* 21.5* 15.8* 16.6*  ?NEUTROABS  --   --  18.0*  --   --   ?HGB 16.1* 15.3* 13.6 10.9* 10.2*  ?HCT 52.7* 51.1* 45.1 36.2 33.2*  ?MCV 93.3 95.7 96.6 94.8 94.6  ?PLT 279 314 300 208 252  ? ? ? ?Basic Metabolic Panel: ?Recent Labs  ?Lab 11/02/21 ?6333 11/03/21 ?5456 11/03/21 ?1437 11/04/21 ?2563 11/05/21 ?8937 11/06/21 ?3428 11/07/21 ?7681  ?NA 137 139 135 139 134* 135 136  ?K 4.1 4.5 4.4 4.5 4.1 4.4 4.7  ?CL 80* 80* 78* 90* 91* 93* 91*  ?CO2 >45* >45* >45* 43* 38* 39* 37*  ?GLUCOSE 137* 135* 247* 158* 169* 144* 202*  ?BUN 23 29*  37* 43* 38* 34* 52*  ?CREATININE 0.51 0.58 0.90 1.04* 0.59 0.60 0.76  ?CALCIUM 8.8* 9.0 9.1 8.1* 7.7* 7.2* 7.9*  ?MG 2.8* 2.3  --  2.5* 2.4 2.0  --   ?PHOS 6.0* 5.0*  --  2.4* 1.5* 2.8 2.7  ? ? ?GFR: ?Estimated Creatinine Clearance: 83 mL/min (by C-G formula based on SCr of 0.76 mg/dL). ?Recent Labs  ?Lab 11/03/21 ?1572 11/04/21 ?6203 11/06/21 ?5597 11/07/21 ?4163  ?WBC 13.4* 21.5* 15.8* 16.6*  ? ? ? ?ABG ?   ?Component Value Date/Time  ? PHART 7.37 11/07/2021 0126  ? PCO2ART 72 ([place]) 11/07/2021 0126  ? PO2ART 109 (H) 11/07/2021 0126  ? HCO3 41.6 (H) 11/07/2021 0126  ? O2SAT 97.9 11/07/2021 0126  ? ?  ?HbA1C: ?Hgb A1c MFr Bld  ?Date/Time Value Ref Range Status  ?07/17/2021 06:23 AM 5.8 (H) 4.8 - 5.6 % Final  ?  Comment:  ?  (NOTE) ?Pre diabetes:          5.7%-6.4% ? ?Diabetes:              >6.4% ? ?Glycemic control for   <7.0% ?adults with diabetes ?  ?10/03/2019 04:45 AM 6.3 (H) 4.8 - 5.6 % Final  ?  Comment:  ?  (NOTE) ?Pre diabetes:  5.7%-6.4% ?Diabetes:              >6.4% ?Glycemic control for   <7.0% ?adults with diabetes ?  ? ? ?CBG: ?Recent Labs  ?Lab 11/06/21 ?1544 11/06/21 ?1945 11/07/21 ?0036 11/07/21 ?0345 11/07/21 ?0726  ?GLUCAP 161* 204* 184* 194* 179*  ? ? ? ? ?Home Medications  ?Prior to Admission medications   ?Medication Sig Start Date End Date Taking? Authorizing Provider  ?albuterol (VENTOLIN HFA) 108 (90 Base) MCG/ACT inhaler Inhale 1-2 puffs into the lungs every 6 (six) hours as needed for wheezing or shortness of breath. 09/01/21  Yes Darylene Price A, FNP  ?aspirin EC 81 MG tablet Take 81 mg by mouth daily.   Yes [provider]  ?empagliflozin (JARDIANCE) 10 MG TABS tablet Take 1 tablet (10 mg total) by mouth daily before breakfast. 07/30/21  Yes Alisa Graff, FNP  ?mometasone-formoterol (DULERA) 100-5 MCG/ACT AERO Inhale 2 puffs into the lungs in the morning and at bedtime. 07/22/21  Yes Dessa Phi, DO  ?Multiple Vitamins-Minerals (MULTIVITAMIN WITH MINERALS) tablet Take 1  tablet by mouth daily.   Yes [provider]  ?potassium chloride SA (KLOR-CON M) 20 MEQ tablet Take 1 tablet (20 mEq total) by mouth daily. 09/02/21  Yes Alisa Graff, FNP  ?tiotropium (SPI

## 2021-11-07 NOTE — Procedures (Signed)
?PROCEDURE: BRONCHOSCOPY ?Therapeutic Aspiration of Tracheobronchial Tree ? ?PROCEDURE DATE: 11/07/2021  TIME:  ?NAME:  Virginia Norman  DOB:Sep 12, 1960  MRN: [id number] ?LOC:  IC12A/IC12A-AA    HOSP DAY: @LENGTHOFSTAYDAYS @ ?CODE STATUS:   ? ?  ?Code Status Orders  ?(From admission, onward)  ?  ? ? ?  ? ?  Start     Ordered  ? 11/09/2021 2337  Full code  Continuous       ? Nov 09, 2021 2338  ? ?  ?  ? ?  ? ?Code Status History   ? ? Date Active Date Inactive Code Status Order ID Comments User Context  ? 07/16/2021 1710 07/22/2021 2037 Full Code [id number]  [id number], MD ED  ? 10/02/2019 2056 10/09/2019 2206 Full Code 2207  [id number], MD Inpatient  ? ?  ?   ? ? ? ?Indications/Preliminary Diagnosis:  ? ?Consent: (Place X beside choice/s below) ? The benefits, risks and possible complications of the procedure were        explained to: ? ___ patient ? __x_ patient's family ? ___ other:___________  ?who verbalized understanding and gave: ? ___ verbal ? ___ written ? _x__ verbal and written ? ___ telephone ? ___ other:________ consent.  ?  ?  Unable to obtain consent; procedure performed on emergent basis.   ?  Other:   ? ? ?  ?PRESEDATION ASSESSMENT: History and Physical has been performed. Patient meds and allergies have been reviewed. Presedation airway examination has been performed and documented. Baseline vital signs, sedation score, oxygenation status, and cardiac rhythm were reviewed. Patient was deemed to be in satisfactory condition to undergo the procedure.  ? ? ?PREMEDICATIONS:  ? Sedative/Narcotic Amt Dose  ? Versed  mg  ? Fentanyl  mcg  ?Diprivan  mg  ?    ? ? ? ? ? ? ?PROCEDURE DETAILS: Timeout performed and correct patient, name, & ID confirmed. Following prep per Pulmonary policy, appropriate sedation was administered. The Bronchoscope was inserted in to oral cavity with bite block in place. Therapeutic aspiration of Tracheobronchial tree was performed.  Airway exam proceeded with findings, technical  procedures, and specimen collection as noted below. At the end of exam the scope was withdrawn without incident. Impression and Plan as noted below.  ? ? ? ? ? ? Insertion Route (Place X beside choice below)  ? Nasal  ? Oral  ?x Endotracheal Tube  ? Tracheostomy  ? ? ? ?TECHNICAL PROCEDURES: (Place X beside choice below) ?  Procedures  Description  ?  None   ?  Electrocautery   ?  Cryotherapy   ?  Balloon Dilatation   ?  Bronchography   ?  Stent Placement   ?x  Therapeutic Aspiration large bloody mucus plug RT lower lobe segment  ?  Laser/Argon Plasma   ? Brachytherapy Catheter Placement   ? Foreign Body Removal    ? ? ? ? ? ?SPECIMENS (Sites): (Place X beside choice below) ? Specimens Description  ? No Specimens Obtained    ? Washings   ?x Lavage large bloody mucus plug RT lower lobe segment  ? Biopsies   ? Fine Needle Aspirates   ? Brushings   ? Sputum   ? ?FINDINGS: large bloody mucus plug RT lower lobe segment ?ESTIMATED BLOOD LOSS: none ?COMPLICATIONS/RESOLUTION: none ? ? ? ? ? ?IMPRESSION:POST-PROCEDURE DX:  ? ?large bloody mucus plug RT lower lobe segment ? ?RECOMMENDATION/PLAN:  ? ? ?Afb and cultures ? ?Rometta Emery, M.D.  ?   Pulmonary & Critical Care Medicine  ?Medical Director [facility] [place] ?Medical Director [facility] Cardio-Pulmonary Department  ? ? ?

## 2021-11-07 NOTE — Consult Note (Signed)
PHARMACY CONSULT NOTE ? ?Pharmacy Consult for Electrolyte Monitoring and Replacement  ? ?Recent Labs: ?Potassium (mmol/L)  ?Date Value  ?11/07/2021 4.7  ?04/05/2014 4.6  ? ?Magnesium (mg/dL)  ?Date Value  ?11/06/2021 2.0  ? ?Calcium (mg/dL)  ?Date Value  ?11/07/2021 7.9 (L)  ? ?Calcium, Total (mg/dL)  ?Date Value  ?04/05/2014 8.2 (L)  ? ?Albumin (g/dL)  ?Date Value  ?11/04/2021 3.0 (L)  ? ?Phosphorus (mg/dL)  ?Date Value  ?11/07/2021 2.7  ? ?Sodium (mmol/L)  ?Date Value  ?11/07/2021 136  ?04/05/2014 141  ? ?Assessment: ?Patient is a 61 y.o. female with medical history including HFpEF, COPD, tobacco use disorder admitted on 10/25/2021 with acute on chronic respiratory failure. Admission complicated by severe metabolic derangements requiring transfer to ICU and ultimately intubation on 5/2. Pharmacy consulted to assist with electrolyte monitoring and replacement as indicated. ? ?MIVF: NS at 75 cc/hr (started for diuretic induced contraction alkalosis) >> stopped 5/3 ?Nutrition: Tube feeds started 5/2 ? ?Goal of Therapy:  ?Electrolytes within normal limits ? ?Plan:  ?--No electrolyte replacement indicated at this time ?--Follow-up electrolytes with AM labs tomorrow ? ?Benita Gutter ?11/07/2021 12:19 PM  ?

## 2021-11-08 ENCOUNTER — Inpatient Hospital Stay: Payer: Self-pay

## 2021-11-08 DIAGNOSIS — A419 Sepsis, unspecified organism: Secondary | ICD-10-CM

## 2021-11-08 DIAGNOSIS — R6521 Severe sepsis with septic shock: Secondary | ICD-10-CM

## 2021-11-08 LAB — LACTIC ACID, PLASMA: Lactic Acid, Venous: 1.2 mmol/L (ref 0.5–1.9)

## 2021-11-08 LAB — BASIC METABOLIC PANEL
Anion gap: 4 — ABNORMAL LOW (ref 5–15)
BUN: 61 mg/dL — ABNORMAL HIGH (ref 8–23)
CO2: 41 mmol/L — ABNORMAL HIGH (ref 22–32)
Calcium: 7.9 mg/dL — ABNORMAL LOW (ref 8.9–10.3)
Chloride: 93 mmol/L — ABNORMAL LOW (ref 98–111)
Creatinine, Ser: 0.59 mg/dL (ref 0.44–1.00)
GFR, Estimated: >60 mL/min (ref >60)
Glucose, Bld: 194 mg/dL — ABNORMAL HIGH (ref 70–99)
Potassium: 4.6 mmol/L (ref 3.5–5.1)
Sodium: 138 mmol/L (ref 135–145)

## 2021-11-08 LAB — GLUCOSE, CAPILLARY
Glucose-Capillary: 212 mg/dL — ABNORMAL HIGH (ref 70–99)
Glucose-Capillary: 165 mg/dL — ABNORMAL HIGH (ref 70–99)
Glucose-Capillary: 172 mg/dL — ABNORMAL HIGH (ref 70–99)
Glucose-Capillary: 168 mg/dL — ABNORMAL HIGH (ref 70–99)
Glucose-Capillary: 142 mg/dL — ABNORMAL HIGH (ref 70–99)
Glucose-Capillary: 135 mg/dL — ABNORMAL HIGH (ref 70–99)
Glucose-Capillary: 175 mg/dL — ABNORMAL HIGH (ref 70–99)

## 2021-11-08 LAB — CBC
HCT: 30.1 % — ABNORMAL LOW (ref 36.0–46.0)
Hemoglobin: 9 g/dL — ABNORMAL LOW (ref 12.0–15.0)
MCH: 28.7 pg (ref 26.0–34.0)
MCHC: 29.9 g/dL — ABNORMAL LOW (ref 30.0–36.0)
MCV: 95.9 fL (ref 80.0–100.0)
Platelets: 283 10*3/uL (ref 150–400)
RBC: 3.14 MIL/uL — ABNORMAL LOW (ref 3.87–5.11)
RDW: 14 % (ref 11.5–15.5)
WBC: 21.7 10*3/uL — ABNORMAL HIGH (ref 4.0–10.5)
nRBC: 3.2 % — ABNORMAL HIGH (ref 0.0–0.2)

## 2021-11-08 LAB — BLOOD GAS, ARTERIAL
Acid-Base Excess: 15.8 mmol/L — ABNORMAL HIGH (ref 0.0–2.0)
Bicarbonate: 42.7 mmol/L — ABNORMAL HIGH (ref 20.0–28.0)
FIO2: 60 %
MECHVT: 450 mL
Mechanical Rate: 20
O2 Saturation: 98.2 %
PEEP: 8 cmH2O
Patient temperature: 37
pCO2 arterial: 60 mmHg — ABNORMAL HIGH (ref 32–48)
pH, Arterial: 7.46 — ABNORMAL HIGH (ref 7.35–7.45)
pO2, Arterial: 120 mmHg — ABNORMAL HIGH (ref 83–108)

## 2021-11-08 LAB — TROPONIN I (HIGH SENSITIVITY): Troponin I (High Sensitivity): 138 ng/L (ref <18)

## 2021-11-08 LAB — TRIGLYCERIDES: Triglycerides: 142 mg/dL (ref <150)

## 2021-11-08 LAB — ACID FAST SMEAR (AFB, MYCOBACTERIA): Acid Fast Smear: NEGATIVE

## 2021-11-08 LAB — MAGNESIUM: Magnesium: 2.7 mg/dL — ABNORMAL HIGH (ref 1.7–2.4)

## 2021-11-08 LAB — PHOSPHORUS: Phosphorus: 1.9 mg/dL — ABNORMAL LOW (ref 2.5–4.6)

## 2021-11-08 MED ORDER — FUROSEMIDE 10 MG/ML IJ SOLN: 80.0000 mg | Freq: Once | INTRAMUSCULAR | Status: DC

## 2021-11-08 MED ORDER — PHENYLEPHRINE CONCENTRATED 100MG/250ML (0.4 MG/ML) INFUSION SIMPLE
0.0000 ug/min | INTRAVENOUS | Status: DC
  Administered 2021-11-08 – 2021-11-09 (×2): 75 ug/min via INTRAVENOUS
  Administered 2021-11-10: 20 ug/min via INTRAVENOUS
  Administered 2021-11-11: 400 ug/min via INTRAVENOUS
  Administered 2021-11-11: 210 ug/min via INTRAVENOUS
  Administered 2021-11-12 (×2): 400 ug/min via INTRAVENOUS
  Filled 2021-11-08 (×8): qty 250

## 2021-11-08 MED ORDER — STERILE WATER FOR INJECTION IJ SOLN
INTRAMUSCULAR | Status: AC
Start: 2021-11-08 — End: 2021-11-08
  Filled 2021-11-08: qty 10

## 2021-11-08 MED ORDER — SODIUM PHOSPHATES 45 MMOLE/15ML IV SOLN
30.0000 mmol | Freq: Once | INTRAVENOUS | Status: AC
  Administered 2021-11-08: 30 mmol via INTRAVENOUS
  Filled 2021-11-08: qty 10

## 2021-11-08 MED ORDER — ACETAZOLAMIDE SODIUM 500 MG IJ SOLR
500.0000 mg | Freq: Once | INTRAMUSCULAR | Status: AC
  Administered 2021-11-08: 500 mg via INTRAVENOUS
  Filled 2021-11-08: qty 500

## 2021-11-08 MED ORDER — PHENYLEPHRINE HCL-NACL 20-0.9 MG/250ML-% IV SOLN
0.0000 ug/min | INTRAVENOUS | Status: DC
  Administered 2021-11-08: 20 ug/min via INTRAVENOUS
  Administered 2021-11-08: 80 ug/min via INTRAVENOUS
  Filled 2021-11-08 (×3): qty 250

## 2021-11-08 MED ORDER — INSULIN ASPART 100 UNIT/ML IJ SOLN
0.0000 [IU] | INTRAMUSCULAR | Status: DC
  Administered 2021-11-08: 3 [IU] via SUBCUTANEOUS
  Administered 2021-11-08 (×2): 4 [IU] via SUBCUTANEOUS
  Administered 2021-11-08: 3 [IU] via SUBCUTANEOUS
  Administered 2021-11-08: 4 [IU] via SUBCUTANEOUS
  Administered 2021-11-09: 7 [IU] via SUBCUTANEOUS
  Administered 2021-11-09 (×5): 4 [IU] via SUBCUTANEOUS
  Administered 2021-11-09: 7 [IU] via SUBCUTANEOUS
  Administered 2021-11-10 (×6): 4 [IU] via SUBCUTANEOUS
  Administered 2021-11-11: 3 [IU] via SUBCUTANEOUS
  Administered 2021-11-11 (×3): 4 [IU] via SUBCUTANEOUS
  Administered 2021-11-11 – 2021-11-12 (×4): 3 [IU] via SUBCUTANEOUS
  Filled 2021-11-08 (×26): qty 1

## 2021-11-08 NOTE — IPAL (Signed)
?  Interdisciplinary Goals of Care Family Meeting ? ? ?Date carried out: 11/08/2021 ? ?Location of the meeting: Bedside ? ? ? ? ?GOALS OF CARE DISCUSSION ? ?The Clinical status was relayed to family in detail-Husband at bedside ? ?Updated and notified of patients medical condition- ?Patient remains unresponsive and will not open eyes to command.   ?Patient is having a weak cough and struggling to remove secretions.   ?Patient with increased WOB and using accessory muscles to breathe ?Explained to family course of therapy and the modalities  ? ?Patient with Progressive multiorgan failure with a very high probablity of a very minimal chance of meaningful recovery despite all aggressive and optimal medical therapy.  ?PATIENT REMAINS FULL CODE ?+COPD ?+DEMAND ISCHEMIA ?+PAF ?VENT SUPPORT ? ?Family understands the situation. ?Likely may need TRACH ? ? ?Family are satisfied with Plan of action and management. All questions answered ? ?Additional CC time 30 mins ? ? ?Lucie Leather, M.D.  ?[facility]  ?Medical Director [facility] ?Medical Director [facility] Cardio-Pulmonary Department  ? ? ? ?

## 2021-11-08 NOTE — Progress Notes (Signed)
0710: Bedside shift report completed. Patient resting in bed continuous medications infusing (per [facility]).  ? ?5: MD rounding on patient discussed potential change of pressor to reduce such frequent, drastic change in BP. Potential plan for CT today, informed MD patient does not tolerate laying flat (per PM RN overnight patient hemodynamics effected with position changes, and yesterday with repositioning/bed bath patient desaturating, afib RVR 130-155, asynchrony with vent).  ? ?0815: Patient husband bedside, update provided. All questions and concerns addressed.  ? ?0920: Dr Belia Heman rounding on patient, discussing plan of care and patient condition. Family with no further questions at this time.  ? ?1045: Attempting to wean norepi and start neo, patient extremely hypertensive. Patient tolerated aggressive weaning of norepi.  ? ?1120: Cardiology rounding on patient, discussing condition with family. Informed of afib episodes of RVR and increase by ICU team of amio gtt. MD recommending cardioversion before attempting to wean sedation.  ? ?1420: MD rounding on patient, informed of cardiology recommendation.  ? ?1515: CHG bath completed. Patient afib 110-135 but maintained oxygen saturation 90%+.  ? ?1700: Patient family bedside, update provided.  ? ?1830: Patient in bed resting, BP labile with attempt to switch norepi to neo (per [facility]). Patient repositioned and provided oral care q2hr this shift.  ? ?

## 2021-11-08 NOTE — Progress Notes (Signed)
? ?Progress Note ? ?Patient Name: Virginia Norman ?Date of Encounter: 11/08/2021 ? ?[facility] Cardiologist: Ida Rogue, MD  ? ?Subjective  ? ?Intubated, sedated ?Remains in atrial fibrillation past 24 hours on amiodarone infusion at higher rate 1 mg/min ?Blood pressure low 09X systolic ?May 5 underwent bronc, bloody mucous plugs aspirated ? ?Inpatient Medications  ?  ?Scheduled Meds: ? apixaban  5 mg Per Tube BID  ? chlorhexidine gluconate (MEDLINE KIT)  15 mL Mouth Rinse BID  ? Chlorhexidine Gluconate Cloth  6 each Topical Daily  ? docusate  100 mg Per Tube BID  ? free water  30 mL Per Tube Q4H  ? guaiFENesin  600 mg Oral BID  ? insulin aspart  0-20 Units Subcutaneous Q4H  ? ipratropium-albuterol  3 mL Nebulization Q6H  ? mouth rinse  15 mL Mouth Rinse 10 times per day  ? methylPREDNISolone (SOLU-MEDROL) injection  40 mg Intravenous Q12H  ? pantoprazole sodium  40 mg Per Tube Daily  ? polyethylene glycol  17 g Oral Daily  ? senna  2 tablet Per Tube BID  ? sodium chloride flush  10-40 mL Intracatheter Q12H  ? ?Continuous Infusions: ? amiodarone 60 mg/hr (11/08/21 1200)  ? ampicillin-sulbactam (UNASYN) IV 3 g (11/08/21 1205)  ? feeding supplement (VITAL AF 1.2 CAL) 1,000 mL (11/07/21 0740)  ? fentaNYL infusion INTRAVENOUS 75 mcg/hr (11/08/21 1200)  ? norepinephrine (LEVOPHED) Adult infusion 1 mcg/min (11/08/21 1200)  ? phenylephrine (NEO-SYNEPHRINE) Adult infusion 50 mcg/min (11/08/21 1200)  ? propofol (DIPRIVAN) infusion 20 mcg/kg/min (11/08/21 1200)  ? vasopressin 0.03 Units/min (11/08/21 1200)  ? ?PRN Meds: ?acetaminophen **OR** acetaminophen, fentaNYL, hydrALAZINE, magnesium hydroxide, metoprolol tartrate, ondansetron **OR** ondansetron (ZOFRAN) IV, sodium chloride flush  ? ?Vital Signs  ?  ?Vitals:  ? 11/08/21 1115 11/08/21 1130 11/08/21 1145 11/08/21 1200  ?BP: (!) 109/95 118/85 (!) 86/66 (!) 88/54  ?Pulse: 100 94 96 96  ?Resp: (!) 22 (!) 22 (!) 22 (!) 21  ?Temp:    98.5 ?F (36.9 ?C)  ?TempSrc:       ?SpO2: 93% 95% 93% 93%  ?Weight:      ?Height:      ? ? ?Intake/Output Summary (Last 24 hours) at 11/08/2021 1209 ?Last data filed at 11/08/2021 1200 ?Gross per 24 hour  ?Intake 3181.8 ml  ?Output 2105 ml  ?Net 1076.8 ml  ? ? ?  11/08/2021  ?  5:00 AM 11/07/2021  ?  4:28 AM 11/06/2021  ?  5:00 AM  ?Last 3 Weights  ?Weight (lbs) 200 lb 9.9 oz 193 lb 12.6 oz 170 lb 3.1 oz  ?Weight (kg) 91 kg 87.9 kg 77.2 kg  ?   ? ?Telemetry  ?  ?Atrial fibrillation-rates in the 90s up to 100 personally Reviewed ? ?ECG  ?  ? - Personally Reviewed ? ?Physical Exam  ? ?GEN: Intubated, sedated ?Neck: Unable to estimate JVD ?Cardiac: Irregularly irregular, no murmurs, rubs, or gallops.  ?Respiratory: Clear to auscultation bilaterally. ?GI: Soft,  non-distended  ?MS: No edema; No deformity. ?Neuro: Unable to test ?Psych: Sedated ? ?Labs  ?  ?High Sensitivity Troponin:   ?Recent Labs  ?Lab 10/14/2021 ?1904 10/08/2021 ?2118 11/08/21 ?8338  ?TROPONINIHS 18* 19* 138*  ?   ?Chemistry ?Recent Labs  ?Lab 11/04/21 ?2505 11/05/21 ?3976 11/06/21 ?7341 11/07/21 ?9379 11/08/21 ?0240  ?NA 139 134* 135 136 138  ?K 4.5 4.1 4.4 4.7 4.6  ?CL 90* 91* 93* 91* 93*  ?CO2 43* 38* 39* 37* 41*  ?GLUCOSE  158* 169* 144* 202* 194*  ?BUN 43* 38* 34* 52* 61*  ?CREATININE 1.04* 0.59 0.60 0.76 0.59  ?CALCIUM 8.1* 7.7* 7.2* 7.9* 7.9*  ?MG 2.5* 2.4 2.0  --   --   ?ALBUMIN 3.0*  --   --   --   --   ?GFRNONAA >60 >60 >60 >60 >60  ?ANIONGAP 6 5 3* 8 4*  ?  ?Lipids  ?Recent Labs  ?Lab 11/08/21 ?8366  ?TRIG 142  ?  ?Hematology ?Recent Labs  ?Lab 11/06/21 ?2947 11/07/21 ?6546 11/08/21 ?5035  ?WBC 15.8* 16.6* 21.7*  ?RBC 3.82* 3.51* 3.14*  ?HGB 10.9* 10.2* 9.0*  ?HCT 36.2 33.2* 30.1*  ?MCV 94.8 94.6 95.9  ?MCH 28.5 29.1 28.7  ?MCHC 30.1 30.7 29.9*  ?RDW 13.3 13.7 14.0  ?PLT 208 252 283  ? ?Thyroid  ?Recent Labs  ?Lab 11/04/21 ?0611  ?TSH 0.851  ?FREET4 0.57*  ?  ?BNPNo results for input(s): BNP, PROBNP in the last 168 hours.  ?DDimer No results for input(s): DDIMER in the last 168 hours.   ? ?Radiology  ?  ?DG Chest 1 View ? ?Result Date: 11/08/2021 ?CLINICAL DATA:  Follow-up bilateral airspace disease, ventilator dependent respiratory failure. EXAM: CHEST  1 VIEW COMPARISON:  Portable chest yesterday at 1:07 a.m. FINDINGS: 5:11 a.m., 11/08/2021. Widespread patchy dense bilateral airspace disease continues to be seen on the right-greater-than-left with moderate underlying pleural effusions. The cardiac size is stable. Central vascular distension appears similar. There is aortic atherosclerosis, stable mediastinum. Right IJ central line terminates in the distal SVC, ETT tip remains 4 cm from the carina, NGT tip likely in the body of stomach based on the location of the proximal side-hole. Pneumothorax is not seen. Today there is increasingly dense patchy consolidation in the left upper lung field, slight increased confluent airspace disease in the right upper lung field, with no essential change in the bases. IMPRESSION: 1. Diffuse airspace disease with increasing opacities in the right more so than left upper lobes, no change in aeration of the lower lung zones and moderate pleural effusions. 2. Findings could be due to worsening edema, pneumonia or combination. Cardiac size is stable. Electronically Signed   By: Telford Nab M.D.   On: 11/08/2021 06:56  ? ?DG Chest 1 View ? ?Result Date: 11/07/2021 ?CLINICAL DATA:  Check central line placement EXAM: PORTABLE CHEST 1 VIEW COMPARISON:  11/04/2021 FINDINGS: Cardiac shadow is stable. Endotracheal tube, gastric catheter and left jugular central line are again seen and stable. No pneumothorax is noted. Increasing airspace opacity is noted particularly in the right lung consistent with worsening edema. Superimposed infiltrate may present in this fashion as well. No bony abnormality is seen. IMPRESSION: Tubes and lines as described above. Increasing airspace opacity particularly on the right consistent with worsening edema. Superimposed infiltrate could not  be totally excluded. Electronically Signed   By: Inez Catalina M.D.   On: 11/07/2021 01:16   ? ?Cardiac Studies  ? ?Echocardiogram ? 1. Left ventricular ejection fraction, by estimation, is 50 to 55%. The  ?left ventricle has low normal function. The left ventricle has no regional  ?wall motion abnormalities. Left ventricular diastolic parameters are  ?consistent with Grade I diastolic  ?dysfunction (impaired relaxation).  ? 2. Right ventricular systolic function is normal. The right ventricular  ?size is normal.  ? 3. The mitral valve is normal in structure. Trivial mitral valve  ?regurgitation. No evidence of mitral stenosis.  ? 4. The aortic valve is normal in structure.  Aortic valve regurgitation is  ?not visualized. Aortic valve sclerosis is present, with no evidence of  ?aortic valve stenosis.  ? 5. The inferior vena cava is normal in size with greater than 50%  ?respiratory variability, suggesting right atrial pressure of 3 mmHg.  ? ?Patient Profile  ?   ?61 y.o. female with history of HFpEF, COPD, and HTN admitted with acute on chronic hypoxic and hypercapnic respiratory failure secondary to COPD exacerbation and parainfluenza who we are seeing for Afib with RVR. ? ?Assessment & Plan  ?  ?Paroxysmal atrial fibrillation/flutter ?Paroxysmal atrial fibrillation though has been more persistent past 24 hours since bronc ?--Amiodarone was increased up to 1 mg/min for elevated rate ?Current rate remains in the 90-100 range ?--We will continue amiodarone infusion for full load. ?Consider cardioversion before any attempt at weaning sedation and extubation ?  ?End-stage COPD/-Acute on chronic hypoxic and hypercarbic respiratory failure with shock, parainfluenza ?In the setting of underlying COPD, long smoking history ?Remains intubated, sedated, on pressors for hypotension ?  ?Hypotension/shock from pneumonia ?On dual pressors norepi and vasopressin ?Blood pressure remains tenuous ?  ?Demand ischemia ?Minimally  elevated troponin in the setting of respiratory distress as above ?No plan for ischemic work-up at this time ? ? Total encounter time more than 50 minutes ? Greater than 50% was spent in counseling and coordination o

## 2021-11-08 NOTE — Progress Notes (Signed)
? ?NAME:  Virginia Norman, MRN:  [id number], DOB:  1960/10/31, LOS: 11 ?ADMISSION DATE:  10/23/2021, CONSULTATION DATE:  11/03/2021 ?REFERRING MD:  Shawna Clamp MD, CHIEF COMPLAINT:  Severe hypercapnia  ? ?History of Present Illness:  ?Is a 62 year old woman with a medical history of CHF with preserved ejection fraction, COPD and ongoing tobacco abuse, who was admitted on 29 October 2021 with acute on chronic respiratory failure with hypoxia and hypercarbia.  She has been managed in the progressive care unit.  She presented with a working diagnosis of decompensation CHF.  She received diuretics until 4/30.  She was transferred to the intensive care unit due to need for noninvasive ventilation and encephalopathy likely due to her respiratory failure.  She was noted to have severe metabolic derangements to include very low chloride, very high bicarbonate and high PaCO2.  She has significant hemoconcentration on CBC.  We are asked to assist in her management. ? ?Pertinent  Medical History  ?COPD ?CHF with preserved EF ?CAD ?HTN ? ?Significant Hospital Events: ?Including procedures, antibiotic start and stop dates in addition to other pertinent events   ?11/02/2021 transferred to ICU due to severe hypercapnia and severe metabolic derangements ?11/03/2021 requiring AVAPS, stopped all diuretics, Diamox, sodium chloride infusion ?11/03/2021 patient required intubation in the evening CT head negative for acute CNS process ?11/04/2021 on mechanical ventilation ?5/3 remains on vent plan for weaning trial ?5/4 remains on vent severe COPD ?5/5 failure to wean from vent, failed SAT/SBT ?5/5 s/p bronch bloody mucus plugs aspirated-cultures sent ? ?Interim History / Subjective:  ?Failing SAT/SBT's ?Need to consider CT chest to assess lung damage ?Remains on vebt ?Remains critically ill ?End stage COPD ?Severe COPD exacerbation ? ?Vent Mode: PRVC ?FiO2 (%):  [50 %-60 %] 60 % ?Set Rate:  [20 bmp] 20 bmp ?Vt Set:  [450 mL] 450 mL ?PEEP:  [8  cmH20] 8 cmH20 ?Plateau Pressure:  [25 [id number] ? ? ?Objective   ?Blood pressure 116/79, pulse 99, temperature 98.9 ?F (37.2 ?C), temperature source Axillary, resp. rate 20, height 5\' 7"  (1.702 m), weight 91 kg, SpO2 96 %. ?CVP:  [0 mmHg-18 mmHg] 4 mmHg  ?Vent Mode: PRVC ?FiO2 (%):  [50 %-60 %] 60 % ?Set Rate:  [20 bmp] 20 bmp ?Vt Set:  [450 mL] 450 mL ?PEEP:  [8 cmH20] 8 cmH20 ?Plateau Pressure:  [25 [id number] cmH20] 27 cmH20  ? ?Intake/Output Summary (Last 24 hours) at 11/08/2021 0723 ?Last data filed at 11/08/2021 0609 ?Gross per 24 hour  ?Intake 2854.95 ml  ?Output 1835 ml  ?Net 1019.95 ml  ? ? ?Filed Weights  ? 11/06/21 0500 11/07/21 0428 11/08/21 0500  ?Weight: 77.2 kg 87.9 kg 91 kg  ? ? ?REVIEW OF SYSTEMS ? ?PATIENT IS UNABLE TO PROVIDE COMPLETE REVIEW OF SYSTEMS DUE TO SEVERE CRITICAL ILLNESS AND TOXIC METABOLIC ENCEPHALOPATHY ? ? ? ?PHYSICAL EXAMINATION: ? ?GENERAL:critically ill appearing, +resp distress ?EYES: Pupils equal, round, reactive to light.  No scleral icterus.  ?MOUTH: Moist mucosal membrane. INTUBATED ?NECK: Supple.  ?PULMONARY: +rhonchi, +wheezing ?CARDIOVASCULAR: S1 and S2.  No murmurs  ?GASTROINTESTINAL: Soft, nontender, -distended. Positive bowel sounds.  ?MUSCULOSKELETAL: No swelling, clubbing, or edema.  ?NEUROLOGIC: obtunded ?SKIN:intact,warm,dry ? ? ? ?Assessment & Plan:  ?61 yo severe End stage COPD with viral infection and severe hypoxic and hypercapnic resp failure, Acute on chronic hypercarbic respiratory failure in the setting of AECOPD due to Parainfluenza 3 ?Status post mucous plugging, ?Now intubated, mechanically ventilated ?Acute on chronic hypercarbic respiratory failure  with severe hypercapnia in the setting of severe metabolic (contraction) alkalosis ?Other factors: COPD, RB ILD, Severe kyphosis with restrictive physiology ? ? ?Severe ACUTE Hypoxic and Hypercapnic Respiratory Failure ?-continue Mechanical Ventilator support ?-Wean Fio2 and PEEP as  tolerated ?-VAP/VENT bundle implementation ?- Wean PEEP & FiO2 as tolerated, maintain SpO2 > 88% ?- Head of bed elevated 30 degrees, VAP protocol in place ?- Plateau pressures less than 30 cm H20  ?- Intermittent chest x-ray & ABG PRN ?- Ensure adequate pulmonary hygiene  ?-will perform NOT SAT/SBT severe hypoxia ? ?PATIENT VERY TENUOUS STATE ?WOULD LIKE TO GET CT SCANS HOWEVER PATIENT WITH NEAR CARDIAC ARREST WHEN LAYING FLAT ? ?SEVERE COPD EXACERBATION ?-continue IV steroids as prescribed ?-continue NEB THERAPY as prescribed ?-morphine as needed ?-wean fio2 as needed and tolerated ? ? CARDIAC  PAF on AMIO ?-oxygen as needed ?-Lasix as tolerated ? ? ?SEPTIC shock hypovolemia  shock ?SOURCE-pneumonia ?-use vasopressors to keep MAP>65 as needed ? ?INFECTIOUS DISEASE PARAINFLUENZA PNEUMONIA ?Follow up CULTURES ?-continue antibiotics as prescribed ?-follow up cultures ? ? ? ?NEUROLOGY ?ACUTE TOXIC METABOLIC ENCEPHALOPATHY SEVERE HYPOXIA ?-need for sedation ?-Goal RASS -2 to -3 ? ? ? ?ENDO ?- ICU hypoglycemic\Hyperglycemia protocol ?-check FSBS per protocol ? ? ?GI ?GI PROPHYLAXIS as indicated ? ?NUTRITIONAL STATUS ?DIET-->TF's as tolerated ?Constipation protocol as indicated ? ? ?ELECTROLYTES ?-follow labs as needed ?-replace as needed ?-pharmacy consultation and following ? ? ? ?Best Practice (right click and "Reselect all SmartList Selections" daily)  ? ?Diet/type: NPO ?DVT prophylaxis: DOAC ?GI prophylaxis: N/A ?Lines: Central line and Arterial Line 5/02 ?Foley:  Yes, and it is still needed ?Code Status:  full code ? ? ?Labs   ?CBC: ?Recent Labs  ?Lab 11/03/21 ?[id number] 11/04/21 ?[id number] 11/06/21 ?[id number] 11/07/21 ?[id number] 11/08/21 ?[id number]  ?WBC 13.4* 21.5* 15.8* 16.6* PENDING  ?NEUTROABS  --  18.0*  --   --   --   ?HGB 15.3* 13.6 10.9* 10.2* 9.0*  ?HCT 51.1* 45.1 36.2 33.2* 30.1*  ?MCV 95.7 96.6 94.8 94.6 95.9  ?PLT 314 300 208 252 283  ? ? ? ?Basic Metabolic Panel: ?Recent Labs  ?Lab 11/02/21 ?[id number] 11/03/21 ?[id number] 11/03/21 ?1437  11/04/21 ?[id number] 11/05/21 ?[id number] 11/06/21 ?[id number] 11/07/21 ?[id number] 11/08/21 ?[id number]  ?NA 137 139   < > 139 134* 135 136 138  ?K 4.1 4.5   < > 4.5 4.1 4.4 4.7 4.6  ?CL 80* 80*   < > 90* 91* 93* 91* 93*  ?CO2 >45* >45*   < > 43* 38* 39* 37* 41*  ?GLUCOSE 137* 135*   < > 158* 169* 144* 202* 194*  ?BUN 23 29*   < > 43* 38* 34* 52* 61*  ?CREATININE 0.51 0.58   < > 1.04* 0.59 0.60 0.76 0.59  ?CALCIUM 8.8* 9.0   < > 8.1* 7.7* 7.2* 7.9* 7.9*  ?MG 2.8* 2.3  --  2.5* 2.4 2.0  --   --   ?PHOS 6.0* 5.0*  --  2.4* 1.5* 2.8 2.7  --   ? < > = values in this interval not displayed.  ? ? ?GFR: ?Estimated Creatinine Clearance: 84.5 mL/min (by C-G formula based on SCr of 0.59 mg/dL). ?Recent Labs  ?Lab 11/04/21 ?0611 11/06/21 ?[id number] 11/07/21 ?[id number] 11/08/21 ?[id number]  ?WBC 21.5* 15.8* 16.6* PENDING  ?LATICACIDVEN  --   --   --  1.2  ? ? ? ?ABG ?   ?Component Value Date/Time  ? PHART 7.46 (H) 11/08/2021 0500  ? PCO2ART 60 (H) 11/08/2021 0500  ?  PO2ART 120 (H) 11/08/2021 0500  ? HCO3 42.7 (H) 11/08/2021 0500  ? O2SAT 98.2 11/08/2021 0500  ? ?  ?HbA1C: ?Hgb A1c MFr Bld  ?Date/Time Value Ref Range Status  ?07/17/2021 06:23 AM 5.8 (H) 4.8 - 5.6 % Final  ?  Comment:  ?  (NOTE) ?Pre diabetes:          5.7%-6.4% ? ?Diabetes:              >6.4% ? ?Glycemic control for   <7.0% ?adults with diabetes ?  ?10/03/2019 04:45 AM 6.3 (H) 4.8 - 5.6 % Final  ?  Comment:  ?  (NOTE) ?Pre diabetes:          5.7%-6.4% ?Diabetes:              >6.4% ?Glycemic control for   <7.0% ?adults with diabetes ?  ? ? ?CBG: ?Recent Labs  ?Lab 11/07/21 ?1618 11/07/21 ?1934 11/07/21 ?2346 11/08/21 ?0328 11/08/21 ?[id number]  ?GLUCAP 196* 197* 226* 212* 165*  ? ? ? ? ?Home Medications  ?Prior to Admission medications   ?Medication Sig Start Date End Date Taking? Authorizing Provider  ?albuterol (VENTOLIN HFA) 108 (90 Base) MCG/ACT inhaler Inhale 1-2 puffs into the lungs every 6 (six) hours as needed for wheezing or shortness of breath. 09/01/21  Yes Darylene Price A, FNP  ?aspirin EC 81 MG tablet  Take 81 mg by mouth daily.   Yes [provider]  ?empagliflozin (JARDIANCE) 10 MG TABS tablet Take 1 tablet (10 mg total) by mouth daily before breakfast. 07/30/21  Yes Alisa Graff, FNP  ?mometasone-fo

## 2021-11-08 NOTE — Consult Note (Signed)
PHARMACY CONSULT NOTE ? ?Pharmacy Consult for Electrolyte Monitoring and Replacement  ? ?Recent Labs: ?Potassium (mmol/L)  ?Date Value  ?11/08/2021 4.6  ?04/05/2014 4.6  ? ?Magnesium (mg/dL)  ?Date Value  ?11/06/2021 2.0  ? ?Calcium (mg/dL)  ?Date Value  ?11/08/2021 7.9 (L)  ? ?Calcium, Total (mg/dL)  ?Date Value  ?04/05/2014 8.2 (L)  ? ?Albumin (g/dL)  ?Date Value  ?11/04/2021 3.0 (L)  ? ?Phosphorus (mg/dL)  ?Date Value  ?11/07/2021 2.7  ? ?Sodium (mmol/L)  ?Date Value  ?11/08/2021 138  ?04/05/2014 141  ? ?Assessment: ?Patient is a 61 y.o. female with medical history including HFpEF, COPD, tobacco use disorder admitted on 11-20-21 with acute on chronic respiratory failure. Admission complicated by severe metabolic derangements requiring transfer to ICU and ultimately intubation on 5/2. Pharmacy consulted to assist with electrolyte monitoring and replacement as indicated. ? ?MIVF: NS at 75 cc/hr (started for diuretic induced contraction alkalosis) >> stopped 5/3 ?Nutrition: Tube feeds started 5/2 ? ?Goal of Therapy:  ?Electrolytes within normal limits ? ?Plan:  ?--No electrolyte replacement indicated at this time ?--Follow-up electrolytes with AM labs tomorrow ? ?Virginia Norman ?11/08/2021 8:41 AM  ?

## 2021-11-09 ENCOUNTER — Inpatient Hospital Stay: Payer: Self-pay

## 2021-11-09 LAB — CBC
HCT: 26.6 % — ABNORMAL LOW (ref 36.0–46.0)
Hemoglobin: 7.8 g/dL — ABNORMAL LOW (ref 12.0–15.0)
MCH: 29.4 pg (ref 26.0–34.0)
MCHC: 29.3 g/dL — ABNORMAL LOW (ref 30.0–36.0)
MCV: 100.4 fL — ABNORMAL HIGH (ref 80.0–100.0)
Platelets: 308 10*3/uL (ref 150–400)
RBC: 2.65 MIL/uL — ABNORMAL LOW (ref 3.87–5.11)
RDW: 14.7 % (ref 11.5–15.5)
WBC: 25.9 10*3/uL — ABNORMAL HIGH (ref 4.0–10.5)
nRBC: 7.8 % — ABNORMAL HIGH (ref 0.0–0.2)

## 2021-11-09 LAB — GLUCOSE, CAPILLARY
Glucose-Capillary: 174 mg/dL — ABNORMAL HIGH (ref 70–99)
Glucose-Capillary: 185 mg/dL — ABNORMAL HIGH (ref 70–99)
Glucose-Capillary: 208 mg/dL — ABNORMAL HIGH (ref 70–99)
Glucose-Capillary: 216 mg/dL — ABNORMAL HIGH (ref 70–99)
Glucose-Capillary: 173 mg/dL — ABNORMAL HIGH (ref 70–99)
Glucose-Capillary: 182 mg/dL — ABNORMAL HIGH (ref 70–99)

## 2021-11-09 LAB — BLOOD GAS, ARTERIAL
Acid-Base Excess: 13.9 mmol/L — ABNORMAL HIGH (ref 0.0–2.0)
Bicarbonate: 45.5 mmol/L — ABNORMAL HIGH (ref 20.0–28.0)
FIO2: 85 %
MECHVT: 450 mL
Mechanical Rate: 12
O2 Saturation: 93.3 %
PEEP: 12 cmH2O
Patient temperature: 37
pCO2 arterial: 99 mmHg (ref 32–48)
pH, Arterial: 7.27 — ABNORMAL LOW (ref 7.35–7.45)
pO2, Arterial: 66 mmHg — ABNORMAL LOW (ref 83–108)

## 2021-11-09 LAB — COMPREHENSIVE METABOLIC PANEL
ALT: 61 U/L — ABNORMAL HIGH (ref 0–44)
AST: 47 U/L — ABNORMAL HIGH (ref 15–41)
Albumin: 2.6 g/dL — ABNORMAL LOW (ref 3.5–5.0)
Alkaline Phosphatase: 41 U/L (ref 38–126)
Anion gap: 6 (ref 5–15)
BUN: 58 mg/dL — ABNORMAL HIGH (ref 8–23)
CO2: 41 mmol/L — ABNORMAL HIGH (ref 22–32)
Calcium: 7.4 mg/dL — ABNORMAL LOW (ref 8.9–10.3)
Chloride: 96 mmol/L — ABNORMAL LOW (ref 98–111)
Creatinine, Ser: 0.56 mg/dL (ref 0.44–1.00)
GFR, Estimated: >60 mL/min (ref >60)
Glucose, Bld: 183 mg/dL — ABNORMAL HIGH (ref 70–99)
Potassium: 4.7 mmol/L (ref 3.5–5.1)
Sodium: 143 mmol/L (ref 135–145)
Total Bilirubin: 0.7 mg/dL (ref 0.3–1.2)
Total Protein: 5.2 g/dL — ABNORMAL LOW (ref 6.5–8.1)

## 2021-11-09 LAB — PHOSPHORUS: Phosphorus: 3.9 mg/dL (ref 2.5–4.6)

## 2021-11-09 LAB — CULTURE, RESPIRATORY W GRAM STAIN: Culture: NORMAL

## 2021-11-09 LAB — MAGNESIUM: Magnesium: 2.8 mg/dL — ABNORMAL HIGH (ref 1.7–2.4)

## 2021-11-09 LAB — PROCALCITONIN: Procalcitonin: 0.18 ng/mL

## 2021-11-09 MED ORDER — VECURONIUM BROMIDE 10 MG IV SOLR
20.0000 mg | Freq: Once | INTRAVENOUS | Status: AC
  Administered 2021-11-09: 20 mg via INTRAVENOUS
  Filled 2021-11-09: qty 20

## 2021-11-09 MED ORDER — GUAIFENESIN 200 MG PO TABS
200.0000 mg | ORAL_TABLET | Freq: Four times a day (QID) | ORAL | Status: DC
  Filled 2021-11-09: qty 1

## 2021-11-09 MED ORDER — ACETAZOLAMIDE SODIUM 500 MG IJ SOLR
500.0000 mg | Freq: Once | INTRAMUSCULAR | Status: AC
  Administered 2021-11-09: 500 mg via INTRAVENOUS
  Filled 2021-11-09: qty 500

## 2021-11-09 MED ORDER — GUAIFENESIN 100 MG/5ML PO LIQD
10.0000 mL | Freq: Four times a day (QID) | ORAL | Status: DC
  Administered 2021-11-09 – 2021-11-10 (×4): 10 mL
  Filled 2021-11-09 (×4): qty 10

## 2021-11-09 NOTE — Progress Notes (Signed)
Acute Hypoxic / Hypercapnic Respiratory Failure secondary to ARDS in the setting of parainfluenza 3 and end stage COPD ?Patient suddenly dropped her SpO2 into the mid 80's requiring increased ventilator support: FiO2 85% and PEEP 12 with resultant SpO2 92%. Patient has been on Eliquis so low risk for PE.  ?STAT CXR 11/09/21: revealed no significant change in diffuse airspace opacities R> L and small bilateral pleural effusions. ?ABG has worsened significantly: 7.27/ 99/ 66/ 45.5 ?Not a candidate for proning at this time due to worsening ventilation in addition to hypoxia. Per respiratory mechanics, via ventilator waveforms, no concern for air trapping at this time. Peak pressures remain borderline high at 38-39. ?Discussed with Dr. Mortimer Fries who is in agreement with plan below. ?- Adjusted Ventilator settings: PRVC  8 mL/kg> Tv increased from 450 to 500, 85% FiO2, decreased PEEP 12 > 8, continue ventilator support & lung protective strategies ?- Wean PEEP & FiO2 as tolerated, maintain SpO2 > 88% ?- Goal Plateau pressures less than 40 cm H20 >> pt has been sustaining 38-39 ?- Repeat ABG at midnight ?- PAD protocol in place: continue Fentanyl drip & Propofol drip ?- vecuronium 20 mg >> RASS goal pre paralytic -5 ? ?Reached out to husband, Jeneen Rinks, but he did not answer and the voicemail was not set up yet. ? ?Spoke with Hart Carwin, daughter, discussed concerns over difficulty ventilating & oxygenating the patient. Discussed ABG values, ventilator changes and provided context that at these high settings she would not be able to undergo tracheostomy placement unless she could tolerate lower support. All questions and concerns answered at this time. ? ? ?Domingo Pulse [place], AGACNP-BC ?Acute Care Nurse Practitioner ?[facility]  ? ?[phone] / [phone] ?Please see Amion for pager details.  ? ?

## 2021-11-09 NOTE — Progress Notes (Signed)
? ?Progress Note ? ?Patient Name: Virginia Norman ?Date of Encounter: 11/09/2021 ? ?[facility] Cardiologist: Ida Rogue, MD  ? ?Subjective  ? ?Intubated sedated ?70% FiO2 ?Remains in atrial fibrillation rate controlled on amiodarone infusion ?On pressors both low-dose vasopressin and nor epi, blood pressure low but stable ?Telemetry reviewed, persistent atrial fibrillation running low 100 bpm ? ?Inpatient Medications  ?  ?Scheduled Meds: ? apixaban  5 mg Per Tube BID  ? chlorhexidine gluconate (MEDLINE KIT)  15 mL Mouth Rinse BID  ? Chlorhexidine Gluconate Cloth  6 each Topical Daily  ? docusate  100 mg Per Tube BID  ? free water  30 mL Per Tube Q4H  ? guaiFENesin  10 mL Per Tube Q6H  ? insulin aspart  0-20 Units Subcutaneous Q4H  ? ipratropium-albuterol  3 mL Nebulization Q6H  ? mouth rinse  15 mL Mouth Rinse 10 times per day  ? methylPREDNISolone (SOLU-MEDROL) injection  40 mg Intravenous Q12H  ? pantoprazole sodium  40 mg Per Tube Daily  ? polyethylene glycol  17 g Oral Daily  ? senna  2 tablet Per Tube BID  ? sodium chloride flush  10-40 mL Intracatheter Q12H  ? ?Continuous Infusions: ? amiodarone 60 mg/hr (11/09/21 1000)  ? ampicillin-sulbactam (UNASYN) IV Stopped (11/09/21 2130)  ? feeding supplement (VITAL AF 1.2 CAL) 1,000 mL (11/07/21 0740)  ? fentaNYL infusion INTRAVENOUS 125 mcg/hr (11/09/21 1000)  ? phenylephrine (NEO-SYNEPHRINE) Adult infusion 60 mcg/min (11/09/21 1000)  ? propofol (DIPRIVAN) infusion 25 mcg/kg/min (11/09/21 1000)  ? vasopressin 0.03 Units/min (11/09/21 1000)  ? ?PRN Meds: ?acetaminophen **OR** acetaminophen, fentaNYL, hydrALAZINE, magnesium hydroxide, metoprolol tartrate, ondansetron **OR** ondansetron (ZOFRAN) IV, sodium chloride flush  ? ?Vital Signs  ?  ?Vitals:  ? 11/09/21 0804 11/09/21 0815 11/09/21 0900 11/09/21 1000  ?BP:   102/74 117/86  ?Pulse:  96 (!) 101 99  ?Resp:  _0 ?Temp:  99.3 ?F (37.4 ?C)    ?TempSrc:  Oral    ?SpO2: 91% 92% 91% 92%  ?Weight:      ?Height:       ? ? ?Intake/Output Summary (Last 24 hours) at 11/09/2021 1126 ?Last data filed at 11/09/2021 1000 ?Gross per 24 hour  ?Intake 3305.92 ml  ?Output 1375 ml  ?Net 1930.92 ml  ? ? ?  11/09/2021  ?  5:00 AM 11/08/2021  ?  5:00 AM 11/07/2021  ?  4:28 AM  ?Last 3 Weights  ?Weight (lbs) 205 lb 11 oz 200 lb 9.9 oz 193 lb 12.6 oz  ?Weight (kg) 93.3 kg 91 kg 87.9 kg  ?   ? ?Telemetry  ?  ?Atrial fibrillation-rates in the 90s up to 100 personally Reviewed ? ?ECG  ?  ? - Personally Reviewed ? ?Physical Exam  ? ?Constitutional: Intubated, sedated ?HENT:  ?Head: Grossly normal ?Eyes:  no discharge. No scleral icterus.  ?Neck: No significant JVD, no carotid bruits  ?Cardiovascular: Irregularly irregular no murmurs appreciated ?Pulmonary/Chest: Bronchial breath sounds, wheezing ?Abdominal: Soft.  no distension.  ?Musculoskeletal: Sedated ?Neurological: Unable to test but grossly nonfocal ?Skin: Skin warm and dry ?Psychiatric: Sedated ? ? ?Labs  ?  ?High Sensitivity Troponin:   ?Recent Labs  ?Lab 10/25/2021 ?1904 10/25/2021 ?2118 11/08/21 ?8657  ?TROPONINIHS 18* 19* 138*  ?   ?Chemistry ?Recent Labs  ?Lab 11/04/21 ?8469 11/05/21 ?6295 11/06/21 ?2841 11/07/21 ?3244 11/08/21 ?0102 11/08/21 ?2132 11/09/21 ?7253  ?NA 139   < > 135 136 138  --  143  ?K 4.5   < >  4.4 4.7 4.6  --  4.7  ?CL 90*   < > 93* 91* 93*  --  96*  ?CO2 43*   < > 39* 37* 41*  --  41*  ?GLUCOSE 158*   < > 144* 202* 194*  --  183*  ?BUN 43*   < > 34* 52* 61*  --  58*  ?CREATININE 1.04*   < > 0.60 0.76 0.59  --  0.56  ?CALCIUM 8.1*   < > 7.2* 7.9* 7.9*  --  7.4*  ?MG 2.5*   < > 2.0  --   --  2.7* 2.8*  ?PROT  --   --   --   --   --   --  5.2*  ?ALBUMIN 3.0*  --   --   --   --   --  2.6*  ?AST  --   --   --   --   --   --  47*  ?ALT  --   --   --   --   --   --  61*  ?ALKPHOS  --   --   --   --   --   --  41  ?BILITOT  --   --   --   --   --   --  0.7  ?GFRNONAA >60   < > >60 >60 >60  --  >60  ?ANIONGAP 6   < > 3* 8 4*  --  6  ? < > = values in this interval not displayed.  ?   ?Lipids  ?Recent Labs  ?Lab 11/08/21 ?1540  ?TRIG 142  ?  ?Hematology ?Recent Labs  ?Lab 11/07/21 ?0867 11/08/21 ?6195 11/09/21 ?0932  ?WBC 16.6* 21.7* 25.9*  ?RBC 3.51* 3.14* 2.65*  ?HGB 10.2* 9.0* 7.8*  ?HCT 33.2* 30.1* 26.6*  ?MCV 94.6 95.9 100.4*  ?MCH 29.1 28.7 29.4  ?MCHC 30.7 29.9* 29.3*  ?RDW 13.7 14.0 14.7  ?PLT 252 283 308  ? ?Thyroid  ?Recent Labs  ?Lab 11/04/21 ?0611  ?TSH 0.851  ?FREET4 0.57*  ?  ?BNPNo results for input(s): BNP, PROBNP in the last 168 hours.  ?DDimer No results for input(s): DDIMER in the last 168 hours.  ? ?Radiology  ?  ?DG Chest 1 View ? ?Result Date: 11/08/2021 ?CLINICAL DATA:  Follow-up bilateral airspace disease, ventilator dependent respiratory failure. EXAM: CHEST  1 VIEW COMPARISON:  Portable chest yesterday at 1:07 a.m. FINDINGS: 5:11 a.m., 11/08/2021. Widespread patchy dense bilateral airspace disease continues to be seen on the right-greater-than-left with moderate underlying pleural effusions. The cardiac size is stable. Central vascular distension appears similar. There is aortic atherosclerosis, stable mediastinum. Right IJ central line terminates in the distal SVC, ETT tip remains 4 cm from the carina, NGT tip likely in the body of stomach based on the location of the proximal side-hole. Pneumothorax is not seen. Today there is increasingly dense patchy consolidation in the left upper lung field, slight increased confluent airspace disease in the right upper lung field, with no essential change in the bases. IMPRESSION: 1. Diffuse airspace disease with increasing opacities in the right more so than left upper lobes, no change in aeration of the lower lung zones and moderate pleural effusions. 2. Findings could be due to worsening edema, pneumonia or combination. Cardiac size is stable. Electronically Signed   By: Telford Nab M.D.   On: 11/08/2021 06:56   ? ?Cardiac Studies  ? ?Echocardiogram ? 1. Left  ventricular ejection fraction, by estimation, is 50 to 55%. The   ?left ventricle has low normal function. The left ventricle has no regional  ?wall motion abnormalities. Left ventricular diastolic parameters are  ?consistent with Grade I diastolic  ?dysfunction (impaired relaxation).  ? 2. Right ventricular systolic function is normal. The right ventricular  ?size is normal.  ? 3. The mitral valve is normal in structure. Trivial mitral valve  ?regurgitation. No evidence of mitral stenosis.  ? 4. The aortic valve is normal in structure. Aortic valve regurgitation is  ?not visualized. Aortic valve sclerosis is present, with no evidence of  ?aortic valve stenosis.  ? 5. The inferior vena cava is normal in size with greater than 50%  ?respiratory variability, suggesting right atrial pressure of 3 mmHg.  ? ?Patient Profile  ?   ?61 y.o. female with history of HFpEF, COPD, and HTN admitted with acute on chronic hypoxic and hypercapnic respiratory failure secondary to COPD exacerbation and parainfluenza who we are seeing for Afib with RVR. ? ?Assessment & Plan  ?  ?Paroxysmal atrial fibrillation/flutter ?Arrhythmia has been paroxysmal through most of her hospital course in setting of profound respiratory distress with hypoxia ?-Despite amiodarone infusion, now persistent atrial fibrillation rate low 100 bpm ?-While she is sedated, could consider bedside cardioversion before any attempt at weaning sedation and extubation ?  ?End-stage COPD/-Acute on chronic hypoxic and hypercarbic respiratory failure with shock, parainfluenza ?In the setting of underlying COPD, long smoking history per family ?Remains intubated, sedated, on pressors for hypotension ?Wheezing on exam, 75% FiO2, very difficult wean ?-Notes indicating may need trach ?  ?Hypotension/shock from pneumonia ?Remains on vasopressin, nor epi for blood pressure support in the setting of sedation and sepsis ?Blood pressure remains tenuous ?  ?Demand ischemia ?Minimally elevated troponin in the setting of respiratory distress as  above ?No plan for ischemic work-up at this time ? ? Total encounter time more than 50 minutes ? Greater than 50% was spent in counseling and coordination of care with the patient ? ?  ? ?For questions or upda

## 2021-11-09 NOTE — Progress Notes (Signed)
? ?NAME:  Virginia Norman, MRN:  [id number], DOB:  Apr 22, 1961, LOS: 12 ?ADMISSION DATE:  10/17/2021, CONSULTATION DATE:  11/03/2021 ?REFERRING MD:  Shawna Clamp MD, CHIEF COMPLAINT:  Severe hypercapnia  ? ?History of Present Illness:  ?Is a 61 year old woman with a medical history of CHF with preserved ejection fraction, COPD and ongoing tobacco abuse, who was admitted on 29 October 2021 with acute on chronic respiratory failure with hypoxia and hypercarbia.  She has been managed in the progressive care unit.  She presented with a working diagnosis of decompensation CHF.  She received diuretics until 4/30.  She was transferred to the intensive care unit due to need for noninvasive ventilation and encephalopathy likely due to her respiratory failure.  She was noted to have severe metabolic derangements to include very low chloride, very high bicarbonate and high PaCO2.  She has significant hemoconcentration on CBC.  We are asked to assist in her management. ? ?Pertinent  Medical History  ?COPD ?CHF with preserved EF ?CAD ?HTN ? ?Significant Hospital Events: ?Including procedures, antibiotic start and stop dates in addition to other pertinent events   ?11/02/2021 transferred to ICU due to severe hypercapnia and severe metabolic derangements ?11/03/2021 requiring AVAPS, stopped all diuretics, Diamox, sodium chloride infusion ?11/03/2021 patient required intubation in the evening CT head negative for acute CNS process ?11/04/2021 on mechanical ventilation ?5/3 remains on vent plan for weaning trial ?5/4 remains on vent severe COPD ?5/5 failure to wean from vent, failed SAT/SBT ?5/5 s/p bronch bloody mucus plugs aspirated-cultures sent ?5/6 severe Hypoxia unable to wean, husband updated ?5/7 severe Hypoxia ? ?Interim History / Subjective:  ?Failed SAT/SBT ?Will need to consider CT  chest and HEAD BUT near cardiac arrest when laying flat ?Remains on vent ?Remains critically ill ?End stage COPD ?Severe COPD exacerbation ?Assess  for secondary pneumonia ?BAL cultures pending ?Vent Mode: PRVC ?FiO2 (%):  [60 %-70 %] 70 % ?Set Rate:  [20 bmp] 20 bmp ?Vt Set:  [450 mL] 450 mL ?PEEP:  [8 cmH20] 8 cmH20 ?Plateau Pressure:  [24 cmH20-26 cmH20] 24 cmH20 ? ? ?Objective   ?Blood pressure (!) 79/65, pulse (!) 109, temperature (!) 101.2 ?F (38.4 ?C), temperature source Oral, resp. rate 19, height _0  (1.702 m), weight 93.3 kg, SpO2 (!) 89 %. ?CVP:  [6 mmHg-21 mmHg] 10 mmHg  ?Vent Mode: PRVC ?FiO2 (%):  [60 %-70 %] 70 % ?Set Rate:  [20 bmp] 20 bmp ?Vt Set:  [450 mL] 450 mL ?PEEP:  [8 cmH20] 8 cmH20 ?Plateau Pressure:  [24 cmH20-26 cmH20] 24 cmH20  ? ?Intake/Output Summary (Last 24 hours) at 11/09/2021 0725 ?Last data filed at 11/09/2021 9528 ?Gross per 24 hour  ?Intake 3396.57 ml  ?Output 1785 ml  ?Net 1611.57 ml  ? ? ?Filed Weights  ? 11/07/21 0428 11/08/21 0500 11/09/21 0500  ?Weight: 87.9 kg 91 kg 93.3 kg  ? ? ?REVIEW OF SYSTEMS ? ?PATIENT IS UNABLE TO PROVIDE COMPLETE REVIEW OF SYSTEMS DUE TO SEVERE CRITICAL ILLNESS AND TOXIC METABOLIC ENCEPHALOPATHY ? ? ? ?PHYSICAL EXAMINATION: ? ?GENERAL:critically ill appearing, +resp distress ?EYES: Pupils equal, round, reactive to light.  No scleral icterus.  ?MOUTH: Moist mucosal membrane. INTUBATED ?NECK: Supple.  ?PULMONARY: +rhonchi, +wheezing ?CARDIOVASCULAR: S1 and S2.  No murmurs  ?GASTROINTESTINAL: Soft, nontender, -distended. Positive bowel sounds.  ?MUSCULOSKELETAL: No swelling, clubbing, or edema.  ?NEUROLOGIC: obtunded ?SKIN:intact,warm,dry ? ? ? ? ?Assessment & Plan:  ?61 yo severe End stage COPD with viral infection and severe hypoxic and hypercapnic resp failure,  Acute on chronic hypercarbic respiratory failure in the setting of AECOPD due to Parainfluenza 3 ?Status post mucous plugging, ?Now intubated, mechanically ventilated ?Acute on chronic hypercarbic respiratory failure with severe hypercapnia in the setting of severe metabolic (contraction) alkalosis ?Other factors: COPD, RB ILD, Severe  kyphosis with restrictive physiology ? ?Severe ACUTE Hypoxic and Hypercapnic Respiratory Failure ?-continue Mechanical Ventilator support ?-Wean Fio2 and PEEP as tolerated ?-VAP/VENT bundle implementation ?- Wean PEEP & FiO2 as tolerated, maintain SpO2 > 88% ?- Head of bed elevated 30 degrees, VAP protocol in place ?- Plateau pressures less than 30 cm H20  ?- Intermittent chest x-ray & ABG PRN ?- Ensure adequate pulmonary hygiene  ?-will NOT perform SAT/SBT severe HYPOXIA ? ? ?PATIENT VERY TENUOUS STATE ?WOULD LIKE TO GET CT SCANS HOWEVER PATIENT WITH NEAR CARDIAC ARREST WHEN LAYING FLAT ? ?SEVERE COPD EXACERBATION ?-continue IV steroids as prescribed ?-continue NEB THERAPY as prescribed ? ?ACUTE CARDIAC FAILURE- PAF on AMIO ?-oxygen as needed ?-Lasix as tolerated ?Afib/Aflutter-on AMIO ? ?SEPTIC shock ?SOURCE-pneumonia ?-use vasopressors to keep MAP>65 as needed ?-follow ABG and LA as needed ?-follow up cultures ?-emperic ABX ? ?INFECTIOUS DISEASE PARAINFLUENZA PNEUMONIA ?-continue antibiotics as prescribed ?-follow up cultures ? ? ?NEUROLOGY ?ACUTE TOXIC METABOLIC ENCEPHALOPATHY SEVERE HYPOXIA ?Will need CT head and MRI brain when more stable  ?-need for sedation ?-Goal RASS -2 to -3 ? ? ?ENDO ?- ICU hypoglycemic\Hyperglycemia protocol ?-check FSBS per protocol ? ? ?GI ?GI PROPHYLAXIS as indicated ? ?NUTRITIONAL STATUS ?DIET-->TF's as tolerated ?Constipation protocol as indicated ? ? ?ELECTROLYTES ?-follow labs as needed ?-replace as needed ?-pharmacy consultation and following ? ? ? ?Best Practice (right click and "Reselect all SmartList Selections" daily)  ? ?Diet/type: NPO ?DVT prophylaxis: DOAC ?GI prophylaxis: N/A ?Lines: Central line and Arterial Line 5/02 ?Foley:  Yes, and it is still needed ?Code Status:  full code ? ? ?Labs   ?CBC: ?Recent Labs  ?Lab 11/04/21 ?0611 11/06/21 ?9030 11/07/21 ?0923 11/08/21 ?3007 11/09/21 ?6226  ?WBC 21.5* 15.8* 16.6* 21.7* 25.9*  ?NEUTROABS 18.0*  --   --   --   --   ?HGB  13.6 10.9* 10.2* 9.0* 7.8*  ?HCT 45.1 36.2 33.2* 30.1* 26.6*  ?MCV 96.6 94.8 94.6 95.9 100.4*  ?PLT 300 208 252 283 308  ? ? ? ?Basic Metabolic Panel: ?Recent Labs  ?Lab 11/04/21 ?3335 11/05/21 ?4562 11/06/21 ?5638 11/07/21 ?9373 11/08/21 ?4287 11/08/21 ?2132 11/09/21 ?6811  ?NA 139 134* 135 136 138  --  143  ?K 4.5 4.1 4.4 4.7 4.6  --  4.7  ?CL 90* 91* 93* 91* 93*  --  96*  ?CO2 43* 38* 39* 37* 41*  --  41*  ?GLUCOSE 158* 169* 144* 202* 194*  --  183*  ?BUN 43* 38* 34* 52* 61*  --  58*  ?CREATININE 1.04* 0.59 0.60 0.76 0.59  --  0.56  ?CALCIUM 8.1* 7.7* 7.2* 7.9* 7.9*  --  7.4*  ?MG 2.5* 2.4 2.0  --   --  2.7* 2.8*  ?PHOS 2.4* 1.5* 2.8 2.7  --  1.9* 3.9  ? ? ?GFR: ?Estimated Creatinine Clearance: 85.5 mL/min (by C-G formula based on SCr of 0.56 mg/dL). ?Recent Labs  ?Lab 11/06/21 ?5726 11/07/21 ?2035 11/08/21 ?5974 11/09/21 ?1638  ?PROCALCITON  --   --   --  0.18  ?WBC 15.8* 16.6* 21.7* 25.9*  ?LATICACIDVEN  --   --  1.2  --   ? ? ? ?ABG ?   ?Component Value Date/Time  ?  PHART 7.46 (H) 11/08/2021 0500  ? PCO2ART 60 (H) 11/08/2021 0500  ? PO2ART 120 (H) 11/08/2021 0500  ? HCO3 42.7 (H) 11/08/2021 0500  ? O2SAT 98.2 11/08/2021 0500  ? ?  ?HbA1C: ?Hgb A1c MFr Bld  ?Date/Time Value Ref Range Status  ?07/17/2021 06:23 AM 5.8 (H) 4.8 - 5.6 % Final  ?  Comment:  ?  (NOTE) ?Pre diabetes:          5.7%-6.4% ? ?Diabetes:              >6.4% ? ?Glycemic control for   <7.0% ?adults with diabetes ?  ?10/03/2019 04:45 AM 6.3 (H) 4.8 - 5.6 % Final  ?  Comment:  ?  (NOTE) ?Pre diabetes:          5.7%-6.4% ?Diabetes:              >6.4% ?Glycemic control for   <7.0% ?adults with diabetes ?  ? ? ?CBG: ?Recent Labs  ?Lab 11/08/21 ?1154 11/08/21 ?1704 11/08/21 ?1928 11/08/21 ?2324 11/09/21 ?0343  ?GLUCAP 168* 142* 135* 175* 174*  ? ? ? ? ?Home Medications  ?Prior to Admission medications   ?Medication Sig Start Date End Date Taking? Authorizing Provider  ?albuterol (VENTOLIN HFA) 108 (90 Base) MCG/ACT inhaler Inhale 1-2 puffs into the  lungs every 6 (six) hours as needed for wheezing or shortness of breath. 09/01/21  Yes Darylene Price A, FNP  ?aspirin EC 81 MG tablet Take 81 mg by mouth daily.   Yes [provider]  ?empagliflozin (J

## 2021-11-09 NOTE — Consult Note (Signed)
PHARMACY CONSULT NOTE ? ?Pharmacy Consult for Electrolyte Monitoring and Replacement  ? ?Recent Labs: ?Potassium (mmol/L)  ?Date Value  ?11/09/2021 4.7  ?04/05/2014 4.6  ? ?Magnesium (mg/dL)  ?Date Value  ?11/09/2021 2.8 (H)  ? ?Calcium (mg/dL)  ?Date Value  ?11/09/2021 7.4 (L)  ? ?Calcium, Total (mg/dL)  ?Date Value  ?04/05/2014 8.2 (L)  ? ?Albumin (g/dL)  ?Date Value  ?11/09/2021 2.6 (L)  ? ?Phosphorus (mg/dL)  ?Date Value  ?11/09/2021 3.9  ? ?Sodium (mmol/L)  ?Date Value  ?11/09/2021 143  ?04/05/2014 141  ? ?Assessment: ?Patient is a 61 y.o. female with medical history including HFpEF, COPD, tobacco use disorder admitted on 10/15/2021 with acute on chronic respiratory failure. Admission complicated by severe metabolic derangements requiring transfer to ICU and ultimately intubation on 5/2. Pharmacy consulted to assist with electrolyte monitoring and replacement as indicated. ? ?MIVF: NS at 75 cc/hr (started for diuretic induced contraction alkalosis) >> stopped 5/3 ?Nutrition: Tube feeds started 5/2 ? ?Goal of Therapy:  ?Electrolytes within normal limits ? ?Plan:  ?--No electrolyte replacement indicated at this time ?--Follow-up electrolytes with AM labs tomorrow ? ?Melodee Lupe A [address] ?11/09/2021 9:35 AM  ?

## 2021-11-09 NOTE — Progress Notes (Signed)
ABG obtained. Tidal volume increased to 500. Peep decreased to 8. Sat goal greater than 88. Orders received from NP. Will continue to monitor ?

## 2021-11-09 NOTE — Progress Notes (Signed)
RT called to room to assess patient due to decreased sats. Recruitment maneuver attempted with no success. FiO2 had to be increased to 85% and PEEP to 12 for saturations to rise above 90%. Sats are currently 93%. ?

## 2021-11-10 ENCOUNTER — Inpatient Hospital Stay: Payer: Self-pay

## 2021-11-10 ENCOUNTER — Inpatient Hospital Stay (HOSPITAL_COMMUNITY)
Admit: 2021-11-10 | Discharge: 2021-11-10 | Disposition: A | Discharge status: Home / Self Care | Payer: Self-pay | Attending: Pulmonary Disease | Admitting: Pulmonary Disease

## 2021-11-10 DIAGNOSIS — R578 Other shock: Secondary | ICD-10-CM

## 2021-11-10 DIAGNOSIS — R509 Fever, unspecified: Secondary | ICD-10-CM

## 2021-11-10 DIAGNOSIS — D72829 Elevated white blood cell count, unspecified: Secondary | ICD-10-CM

## 2021-11-10 DIAGNOSIS — J9601 Acute respiratory failure with hypoxia: Secondary | ICD-10-CM

## 2021-11-10 DIAGNOSIS — J80 Acute respiratory distress syndrome: Secondary | ICD-10-CM

## 2021-11-10 DIAGNOSIS — J9602 Acute respiratory failure with hypercapnia: Secondary | ICD-10-CM

## 2021-11-10 LAB — CBC WITH DIFFERENTIAL/PLATELET
Abs Immature Granulocytes: 5 10*3/uL — ABNORMAL HIGH (ref 0.00–0.07)
Band Neutrophils: 13 %
Basophils Absolute: 0 10*3/uL (ref 0.0–0.1)
Basophils Relative: 0 %
Eosinophils Absolute: 0 10*3/uL (ref 0.0–0.5)
Eosinophils Relative: 0 %
HCT: 24.6 % — ABNORMAL LOW (ref 36.0–46.0)
Hemoglobin: 6.9 g/dL — ABNORMAL LOW (ref 12.0–15.0)
Lymphocytes Relative: 9 %
Lymphs Abs: 3.4 10*3/uL (ref 0.7–4.0)
MCH: 29 pg (ref 26.0–34.0)
MCHC: 28 g/dL — ABNORMAL LOW (ref 30.0–36.0)
MCV: 103.4 fL — ABNORMAL HIGH (ref 80.0–100.0)
Metamyelocytes Relative: 1 %
Monocytes Absolute: 1.9 10*3/uL — ABNORMAL HIGH (ref 0.1–1.0)
Monocytes Relative: 5 %
Myelocytes: 10 %
Neutro Abs: 28 10*3/uL — ABNORMAL HIGH (ref 1.7–7.7)
Neutrophils Relative %: 60 %
Platelets: 355 10*3/uL (ref 150–400)
Promyelocytes Relative: 2 %
RBC: 2.38 MIL/uL — ABNORMAL LOW (ref 3.87–5.11)
RDW: 15 % (ref 11.5–15.5)
WBC: 38.3 10*3/uL — ABNORMAL HIGH (ref 4.0–10.5)
nRBC: 16.7 % — ABNORMAL HIGH (ref 0.0–0.2)
nRBC: 25 /100 WBC — ABNORMAL HIGH

## 2021-11-10 LAB — BLOOD GAS, ARTERIAL
Acid-Base Excess: 12.8 mmol/L — ABNORMAL HIGH (ref 0.0–2.0)
Acid-Base Excess: 13.3 mmol/L — ABNORMAL HIGH (ref 0.0–2.0)
Acid-Base Excess: 15.4 mmol/L — ABNORMAL HIGH (ref 0.0–2.0)
Bicarbonate: 43.3 mmol/L — ABNORMAL HIGH (ref 20.0–28.0)
Bicarbonate: 44.2 mmol/L — ABNORMAL HIGH (ref 20.0–28.0)
Bicarbonate: 44.6 mmol/L — ABNORMAL HIGH (ref 20.0–28.0)
FIO2: 100 %
FIO2: 85 %
FIO2: 85 %
MECHVT: 420 mL
MECHVT: 500 mL
MECHVT: 500 mL
Mechanical Rate: 20
Mechanical Rate: 20
Mechanical Rate: 22
O2 Saturation: 92.5 %
O2 Saturation: 95 %
O2 Saturation: 97.1 %
PEEP: 10 cmH2O
PEEP: 8 cmH2O
PEEP: 8 cmH2O
Patient temperature: 37
Patient temperature: 37
Patient temperature: 37
pCO2 arterial: 104 mmHg (ref 32–48)
pCO2 arterial: 88 mmHg (ref 32–48)
pCO2 arterial: 92 mmHg (ref 32–48)
pH, Arterial: 7.24 — ABNORMAL LOW (ref 7.35–7.45)
pH, Arterial: 7.29 — ABNORMAL LOW (ref 7.35–7.45)
pH, Arterial: 7.3 — ABNORMAL LOW (ref 7.35–7.45)
pO2, Arterial: 63 mmHg — ABNORMAL LOW (ref 83–108)
pO2, Arterial: 67 mmHg — ABNORMAL LOW (ref 83–108)
pO2, Arterial: 77 mmHg — ABNORMAL LOW (ref 83–108)

## 2021-11-10 LAB — CBC
HCT: 24.4 % — ABNORMAL LOW (ref 36.0–46.0)
Hemoglobin: 7 g/dL — ABNORMAL LOW (ref 12.0–15.0)
MCH: 29.5 pg (ref 26.0–34.0)
MCHC: 28.7 g/dL — ABNORMAL LOW (ref 30.0–36.0)
MCV: 103 fL — ABNORMAL HIGH (ref 80.0–100.0)
Platelets: 347 10*3/uL (ref 150–400)
RBC: 2.37 MIL/uL — ABNORMAL LOW (ref 3.87–5.11)
RDW: 14.9 % (ref 11.5–15.5)
WBC: 39 10*3/uL — ABNORMAL HIGH (ref 4.0–10.5)
nRBC: 17 % — ABNORMAL HIGH (ref 0.0–0.2)

## 2021-11-10 LAB — PROCALCITONIN: Procalcitonin: 0.19 ng/mL

## 2021-11-10 LAB — GLUCOSE, CAPILLARY
Glucose-Capillary: 152 mg/dL — ABNORMAL HIGH (ref 70–99)
Glucose-Capillary: 162 mg/dL — ABNORMAL HIGH (ref 70–99)
Glucose-Capillary: 162 mg/dL — ABNORMAL HIGH (ref 70–99)
Glucose-Capillary: 190 mg/dL — ABNORMAL HIGH (ref 70–99)
Glucose-Capillary: 190 mg/dL — ABNORMAL HIGH (ref 70–99)
Glucose-Capillary: 200 mg/dL — ABNORMAL HIGH (ref 70–99)

## 2021-11-10 LAB — COMPREHENSIVE METABOLIC PANEL
ALT: 70 U/L — ABNORMAL HIGH (ref 0–44)
AST: 58 U/L — ABNORMAL HIGH (ref 15–41)
Albumin: 2.5 g/dL — ABNORMAL LOW (ref 3.5–5.0)
Alkaline Phosphatase: 35 U/L — ABNORMAL LOW (ref 38–126)
Anion gap: 5 (ref 5–15)
BUN: 70 mg/dL — ABNORMAL HIGH (ref 8–23)
CO2: 41 mmol/L — ABNORMAL HIGH (ref 22–32)
Calcium: 7.3 mg/dL — ABNORMAL LOW (ref 8.9–10.3)
Chloride: 97 mmol/L — ABNORMAL LOW (ref 98–111)
Creatinine, Ser: 0.67 mg/dL (ref 0.44–1.00)
GFR, Estimated: >60 mL/min (ref >60)
Glucose, Bld: 168 mg/dL — ABNORMAL HIGH (ref 70–99)
Potassium: 4.9 mmol/L (ref 3.5–5.1)
Sodium: 143 mmol/L (ref 135–145)
Total Bilirubin: 0.9 mg/dL (ref 0.3–1.2)
Total Protein: 4.8 g/dL — ABNORMAL LOW (ref 6.5–8.1)

## 2021-11-10 LAB — BASIC METABOLIC PANEL
Anion gap: 4 — ABNORMAL LOW (ref 5–15)
BUN: 88 mg/dL — ABNORMAL HIGH (ref 8–23)
CO2: 42 mmol/L — ABNORMAL HIGH (ref 22–32)
Calcium: 7.4 mg/dL — ABNORMAL LOW (ref 8.9–10.3)
Chloride: 94 mmol/L — ABNORMAL LOW (ref 98–111)
Creatinine, Ser: 1.06 mg/dL — ABNORMAL HIGH (ref 0.44–1.00)
GFR, Estimated: 59 mL/min — ABNORMAL LOW (ref >60)
Glucose, Bld: 215 mg/dL — ABNORMAL HIGH (ref 70–99)
Potassium: 5.3 mmol/L — ABNORMAL HIGH (ref 3.5–5.1)
Sodium: 140 mmol/L (ref 135–145)

## 2021-11-10 LAB — URINALYSIS, ROUTINE W REFLEX MICROSCOPIC
Bilirubin Urine: NEGATIVE
Glucose, UA: NEGATIVE mg/dL
Hgb urine dipstick: NEGATIVE
Ketones, ur: NEGATIVE mg/dL
Leukocytes,Ua: NEGATIVE
Nitrite: NEGATIVE
Protein, ur: NEGATIVE mg/dL
Specific Gravity, Urine: 1.015 (ref 1.005–1.030)
pH: 5 (ref 5.0–8.0)

## 2021-11-10 LAB — TROPONIN I (HIGH SENSITIVITY)
Troponin I (High Sensitivity): 237 ng/L (ref <18)
Troponin I (High Sensitivity): 280 ng/L (ref <18)

## 2021-11-10 LAB — ECHOCARDIOGRAM LIMITED
Height: 67 in
S' Lateral: 3.36 cm
Weight: 3329.83 oz

## 2021-11-10 LAB — PATHOLOGIST SMEAR REVIEW

## 2021-11-10 LAB — C-REACTIVE PROTEIN: CRP: 1.5 mg/dL — ABNORMAL HIGH (ref <1.0)

## 2021-11-10 LAB — PROTIME-INR
INR: 1.6 — ABNORMAL HIGH (ref 0.8–1.2)
Prothrombin Time: 19 seconds — ABNORMAL HIGH (ref 11.4–15.2)

## 2021-11-10 LAB — PHOSPHORUS: Phosphorus: 2.2 mg/dL — ABNORMAL LOW (ref 2.5–4.6)

## 2021-11-10 LAB — BRAIN NATRIURETIC PEPTIDE: B Natriuretic Peptide: 1203.4 pg/mL — ABNORMAL HIGH (ref 0.0–100.0)

## 2021-11-10 LAB — MAGNESIUM: Magnesium: 2.8 mg/dL — ABNORMAL HIGH (ref 1.7–2.4)

## 2021-11-10 LAB — APTT: aPTT: 29 seconds (ref 24–36)

## 2021-11-10 LAB — LACTATE DEHYDROGENASE: LDH: 444 U/L — ABNORMAL HIGH (ref 98–192)

## 2021-11-10 MED ORDER — POLYETHYLENE GLYCOL 3350 17 G PO PACK
17.0000 g | PACK | Freq: Two times a day (BID) | ORAL | Status: DC
  Administered 2021-11-10 – 2021-11-11 (×3): 17 g via ORAL
  Filled 2021-11-10 (×3): qty 1

## 2021-11-10 MED ORDER — VECURONIUM BROMIDE 10 MG IV SOLR
0.0000 ug/kg/min | Status: DC
  Administered 2021-11-10 – 2021-11-12 (×3): 1 ug/kg/min via INTRAVENOUS
  Filled 2021-11-10 (×3): qty 100

## 2021-11-10 MED ORDER — SODIUM CHLORIDE 0.9 % IV SOLN
100.0000 mg | Freq: Two times a day (BID) | INTRAVENOUS | Status: DC
  Administered 2021-11-10 – 2021-11-11 (×4): 100 mg via INTRAVENOUS
  Filled 2021-11-10 (×6): qty 100

## 2021-11-10 MED ORDER — SODIUM CHLORIDE 0.9 % IV SOLN
200.0000 mg | Freq: Once | INTRAVENOUS | Status: AC
  Administered 2021-11-10: 200 mg via INTRAVENOUS
  Filled 2021-11-10: qty 200

## 2021-11-10 MED ORDER — IBUPROFEN 100 MG/5ML PO SUSP
600.0000 mg | Freq: Four times a day (QID) | ORAL | Status: DC | PRN
  Administered 2021-11-10: 600 mg
  Filled 2021-11-10 (×2): qty 30

## 2021-11-10 MED ORDER — FUROSEMIDE 10 MG/ML IJ SOLN
40.0000 mg | Freq: Once | INTRAMUSCULAR | Status: AC
  Administered 2021-11-10: 40 mg via INTRAVENOUS
  Filled 2021-11-10: qty 4

## 2021-11-10 MED ORDER — METHYLPREDNISOLONE SODIUM SUCC 40 MG IJ SOLR
40.0000 mg | Freq: Every day | INTRAMUSCULAR | Status: AC
  Administered 2021-11-11 – 2021-11-12 (×2): 40 mg via INTRAVENOUS
  Filled 2021-11-10 (×2): qty 1

## 2021-11-10 MED ORDER — VECURONIUM BROMIDE 10 MG IV SOLR
10.0000 mg | Freq: Once | INTRAVENOUS | Status: AC
  Administered 2021-11-10: 10 mg via INTRAVENOUS
  Filled 2021-11-10: qty 10

## 2021-11-10 MED ORDER — BISACODYL 10 MG RE SUPP
10.0000 mg | Freq: Every day | RECTAL | Status: DC | PRN
  Administered 2021-11-10: 10 mg via RECTAL
  Filled 2021-11-10: qty 1

## 2021-11-10 MED ORDER — SODIUM PHOSPHATES 45 MMOLE/15ML IV SOLN
15.0000 mmol | Freq: Once | INTRAVENOUS | Status: AC
  Administered 2021-11-10: 15 mmol via INTRAVENOUS
  Filled 2021-11-10: qty 5

## 2021-11-10 MED ORDER — HEPARIN (PORCINE) 25000 UT/250ML-% IV SOLN
1400.0000 [IU]/h | INTRAVENOUS | Status: DC
  Administered 2021-11-10: 1250 [IU]/h via INTRAVENOUS
  Filled 2021-11-10: qty 250

## 2021-11-10 MED ORDER — FUROSEMIDE 10 MG/ML IJ SOLN
40.0000 mg | Freq: Two times a day (BID) | INTRAMUSCULAR | Status: DC
Start: 2021-11-10 — End: 2021-11-12
  Administered 2021-11-10 – 2021-11-11 (×3): 40 mg via INTRAVENOUS
  Filled 2021-11-10 (×4): qty 4

## 2021-11-10 MED ORDER — SODIUM CHLORIDE 0.9 % IV SOLN
1.0000 g | Freq: Three times a day (TID) | INTRAVENOUS | Status: DC
  Administered 2021-11-10 – 2021-11-11 (×3): 1 g via INTRAVENOUS
  Filled 2021-11-10 (×2): qty 20
  Filled 2021-11-10: qty 1
  Filled 2021-11-10: qty 20

## 2021-11-10 MED ORDER — VANCOMYCIN HCL 1500 MG/300ML IV SOLN
1500.0000 mg | Freq: Once | INTRAVENOUS | Status: AC
  Administered 2021-11-10: 1500 mg via INTRAVENOUS
  Filled 2021-11-10: qty 300

## 2021-11-10 MED ORDER — VANCOMYCIN HCL 750 MG/150ML IV SOLN
750.0000 mg | Freq: Two times a day (BID) | INTRAVENOUS | Status: DC
  Administered 2021-11-11: 750 mg via INTRAVENOUS
  Filled 2021-11-10: qty 150

## 2021-11-10 MED ORDER — VECURONIUM BOLUS VIA INFUSION
0.0800 mg/kg | Freq: Once | INTRAVENOUS | Status: AC
  Administered 2021-11-10: 7.6 mg via INTRAVENOUS
  Filled 2021-11-10: qty 8

## 2021-11-10 MED ORDER — METOLAZONE 5 MG PO TABS
5.0000 mg | ORAL_TABLET | Freq: Once | ORAL | Status: AC
  Administered 2021-11-10: 5 mg
  Filled 2021-11-10: qty 1

## 2021-11-10 MED ORDER — HEPARIN BOLUS VIA INFUSION
4000.0000 [IU] | Freq: Once | INTRAVENOUS | Status: AC
  Administered 2021-11-10: 4000 [IU] via INTRAVENOUS
  Filled 2021-11-10: qty 4000

## 2021-11-10 MED ORDER — DEXTROSE 5 % IV SOLN: INTRAVENOUS | Status: AC

## 2021-11-10 MED ORDER — ARTIFICIAL TEARS OPHTHALMIC OINT
1.0000 "application " | TOPICAL_OINTMENT | Freq: Three times a day (TID) | OPHTHALMIC | Status: DC
  Administered 2021-11-10 – 2021-11-12 (×6): 1 via OPHTHALMIC
  Filled 2021-11-10 (×2): qty 3.5

## 2021-11-10 MED ORDER — METHYLPREDNISOLONE SODIUM SUCC 40 MG IJ SOLR
20.0000 mg | Freq: Every day | INTRAMUSCULAR | Status: DC
Start: 2021-11-13 — End: 2021-11-12

## 2021-11-10 MED ORDER — NOREPINEPHRINE 4 MG/250ML-% IV SOLN
0.0000 ug/min | INTRAVENOUS | Status: DC
  Administered 2021-11-10: 2 ug/min via INTRAVENOUS
  Administered 2021-11-10: 14 ug/min via INTRAVENOUS
  Administered 2021-11-10: 18 ug/min via INTRAVENOUS
  Administered 2021-11-11: 25 ug/min via INTRAVENOUS
  Administered 2021-11-11: 20 ug/min via INTRAVENOUS
  Administered 2021-11-11: 24 ug/min via INTRAVENOUS
  Administered 2021-11-11: 22 ug/min via INTRAVENOUS
  Filled 2021-11-10 (×7): qty 250

## 2021-11-10 NOTE — Consult Note (Addendum)
ANTICOAGULATION CONSULT NOTE - Consult ? ?Pharmacy Consult for Eliquis > Heparin gtt conversion ?Indication: atrial fibrillation ? ?No Known Allergies ? ?Patient Measurements: ?Height: 5\' 7"  (170.2 cm) ?Weight: 94.4 kg (208 lb 1.8 oz) ?IBW/kg (Calculated) : 61.6 ?Heparin Dosing Weight: 82.2kg ? ?Vital Signs: ?Temp: 100.6 ?F (38.1 ?C) (05/08 1600) ?Temp Source: Oral (05/08 1325) ?BP: 121/79 (05/08 1700) ?Pulse Rate: 118 (05/08 1700) ? ?Labs: ?Recent Labs  ?  11/08/21 ?01/08/22 11/09/21 ?01/09/22 11/10/21 ?01/10/22 11/10/21 ?1630  ?HGB 9.0* 7.8* 6.9*  7.0*  --   ?HCT 30.1* 26.6* 24.6*  24.4*  --   ?PLT 283 308 355  347  --   ?CREATININE 0.59 0.56 0.67 1.06*  ?TROPONINIHS 138*  --   --   --   ? ? ?Estimated Creatinine Clearance: 64.9 mL/min (A) (by C-G formula based on SCr of 1.06 mg/dL (H)). ? ? ?Medications:  ?NKDA; Heparin Dosing Weight: 82.2kg ?PTA: None ?Inpatient: Eliquis 5mg  BID (4/28-5/08 0900) > Heparin gtt ? ?Assessment: ?[name] w/ h/o HFpEF, COPD, and HTN admitted with acute on chronic hypoxic and hypercapnic respiratory failure secondary to COPDE and parainfluenza who is being evaluated for Afib with RVR. Pharmacy has been consulted for conversion of Eliquis started this admission to heparin gtt. ? ?5/08 - PCCM: "Patient with left pleural effusion. Will hold eliquis and start heparin drip in anticipation of thoracentesis to drain left pleural effusion" ? ?Date Time aPTT/HL Rate/Comment ?     ? ?Baseline Labs: ?aPTT - pending ?INR - pending ?Hgb - 16.1>13.6>10.9>10.2 ?Plts - 279>300>208>252 ? ?Goal of Therapy:  ?Heparin level 0.3-0.7 units/ml ?aPTT 66-102 seconds ?Monitor platelets by anticoagulation protocol: Yes ?  ?Plan:  ?Pt's last dose of Eliquis 5mg  was 5/08 0900; CrCl ~89ml/min. Will reduce initial bolus given recent DOAC & indicated for Afib. Anticipate DOAC lab interference, check HL with first interval aPTT. ? ?Give 4000 units bolus x1; then start heparin infusion at 1250 units/hr ?Check aPTT/Anti-Xa level  in 6 hours and daily once consecutively therapeutic.  ?Titrate by aPTT's until lab correlation is noted, then titrate by anti-xa alone. ?Continue to monitor H&H and platelets daily while on heparin gtt. ? ? Sangeeta Youse ?11/10/2021,5:42 PM ? ? ?

## 2021-11-10 NOTE — Progress Notes (Signed)
? ?Date of Admission:  10/29/2021    ? ? ?ID: Virginia Norman is a 61 y.o. female  ?Principal Problem: ?  Acute respiratory failure with hypoxia and hypercapnia (HCC) ?Active Problems: ?  Benign essential HTN ?  COPD exacerbation ([place]) ?  Chronic diastolic CHF (congestive heart failure) ([place]) ?  Paroxysmal atrial fibrillation (HCC) ? ? ? ?Subjective: ?Denies any. ?Has been spiking high-grade fevers ? ? ?Medications:  ? apixaban  5 mg Per Tube BID  ? chlorhexidine gluconate (MEDLINE KIT)  15 mL Mouth Rinse BID  ? Chlorhexidine Gluconate Cloth  6 each Topical Daily  ? docusate  100 mg Per Tube BID  ? free water  30 mL Per Tube Q4H  ? insulin aspart  0-20 Units Subcutaneous Q4H  ? ipratropium-albuterol  3 mL Nebulization Q6H  ? mouth rinse  15 mL Mouth Rinse 10 times per day  ? [START ON 11/13/2021] methylPREDNISolone (SOLU-MEDROL) injection  20 mg Intravenous Daily  ? [START ON 11/11/2021] methylPREDNISolone (SOLU-MEDROL) injection  40 mg Intravenous Daily  ? pantoprazole sodium  40 mg Per Tube Daily  ? polyethylene glycol  17 g Oral BID  ? senna  2 tablet Per Tube BID  ? sodium chloride flush  10-40 mL Intracatheter Q12H  ? ? ?Objective: ?Vital signs in last 24 hours: ?Temp:  [99.8 ?F (37.7 ?C)-103.1 ?F (39.5 ?C)] 102.1 ?F (38.9 ?C) (05/08 1224) ?Pulse Rate:  [101-133] 120 (05/08 1224) ?Resp:  [5-26] 23 (05/08 1224) ?BP: (83-146)/(51-93) 105/55 (05/08 1200) ?SpO2:  [86 %-96 %] 91 % (05/08 1335) ?Arterial Line BP: (96-171)/(48-79) 133/59 (05/08 1224) ?FiO2 (%):  [85 %-100 %] 100 % (05/08 1335) ?Weight:  [94.4 kg] 94.4 kg (05/08 0500) ? ?LDA ?Foley 11/03/2021 until 11/10/2021 ?Had to be reinserted on 11/10/2021 ?Central lines: CVC triple-lumen placed on 11/04/2021 ?Arterial line placed on 11/04/2021 left radial ?Other catheters ? ?PHYSICAL EXAM:  ?General: Intubated, sedated, 3 pressors ?Head: Normocephalic, without obvious abnormality, atraumatic. ?Eyes: Conjunctivae clear, anicteric sclerae. Pupils are equal ?EN cannot be  examined ?Neck:  symmetrical, no adenopathy, thyroid: non tender ?no carotid bruit and no JVD. ?Back: No decub ?Lungs: Bilateral air entry crepitations both sides right more than left ?Heart: Tachycardia. ?Abdomen: Soft, non-tender,not distended. Bowel sounds normal. No masses ?Extremities: No edema ?Skin: No rashes or lesions. Or bruising ?Lymph: Cervical, supraclavicular normal. ?Neurologic: Cannot be assessed ? ?Lab Results ?Recent Labs  ?  11/09/21 ?0452 11/10/21 ?6387  ?WBC 25.9* 38.3*  39.0*  ?HGB 7.8* 6.9*  7.0*  ?HCT 26.6* 24.6*  24.4*  ?NA 143 143  ?K 4.7 4.9  ?CL 96* 97*  ?CO2 41* 41*  ?BUN 58* 70*  ?CREATININE 0.56 0.67  ? ?Liver Panel ?Recent Labs  ?  11/09/21 ?0452 11/10/21 ?5643  ?PROT 5.2* 4.8*  ?ALBUMIN 2.6* 2.5*  ?AST 47* 58*  ?ALT 61* 70*  ?ALKPHOS 41 35*  ?BILITOT 0.7 0.9  ? ? ?Microbiology: ?Respiratory panel PCR showed on 10/31/2021 parainfluenza virus 3 ?BAL culture on 11/07/2021 no growth ? ?ABG: PCO2 104, pH 7.24, P O2 63, HCO3 44.6 ? ?Studies/Results: ?DG Abd 1 View ? ?Result Date: 11/10/2021 ?CLINICAL DATA:  Abdominal distention. EXAM: ABDOMEN - 1 VIEW COMPARISON:  None Available. FINDINGS: The bowel gas pattern is nonobstructive with mild-to-moderate stool retention large intestine. NGT is in place with the tip projected at the body of the stomach angled slightly left. No radio-opaque calculi or other significant radiographic abnormality are seen, but there is limited image detail in the  abdomen due to the patient not being able to lie flat. IMPRESSION: 1. NGT tip in the body of stomach. 2. Constipation with no acute radiographic findings.  Limited exam. Electronically Signed   By: Telford Nab M.D.   On: 11/10/2021 05:32  ? ?DG Chest Port 1 View ? ?Result Date: 11/09/2021 ?CLINICAL DATA:  Respiratory failure.  History of CHF. EXAM: PORTABLE CHEST 1 VIEW COMPARISON:  Chest radiograph dated 11/08/2021. FINDINGS: Endotracheal tube with tip approximately 4.5 cm above the carina. Left IJ central  venous line with tip over central SVC. Enteric tube extends below the diaphragm with tip over the gastric air. No significant interval change in bilateral pulmonary opacities compared to the prior radiograph. Small bilateral pleural effusions. No pneumothorax. Stable cardiac silhouette. Atherosclerotic calcification of the aorta. No acute osseous pathology. Degenerative changes spine. IMPRESSION: 1. No significant interval change in the bilateral pulmonary opacities compared to the prior radiograph. 2. Small bilateral pleural effusions. Electronically Signed   By: Anner Crete M.D.   On: 11/09/2021 19:20  ? ?US Abdomen Limited RUQ (LIVER/GB) ? ?Result Date: 11/10/2021 ?CLINICAL DATA:  61 year old female with abnormal LFTs. EXAM: ULTRASOUND ABDOMEN LIMITED RIGHT UPPER QUADRANT COMPARISON:  CTA chest 07/17/2021. FINDINGS: Gallbladder: No gallstones or wall thickening visualized. No sonographic Percell Miller sign could be evaluated as the patient is intubated. Common bile duct: Diameter: 5 mm, normal. Liver: Liver echogenicity is within normal limits. Lobulated liver contour appears similar to that in January [place] no definite liver capsule nodularity. No discrete liver lesion or intrahepatic biliary ductal dilatation. Portal vein is patent on color Doppler imaging with normal direction of blood flow towards the liver. Other: Negative visible right kidney.  No free fluid. IMPRESSION: 1. Ultrasound appearance of the liver is within normal limits. 2. Negative gallbladder. No bile duct dilatation. Electronically Signed   By: Genevie Ann M.D.   On: 11/10/2021 05:57   ? ?Bilateral infiltrates right more than left ?Assessment/Plan: ?61 year old female with history of HFpEF CO, COPD, hypertension was admitted with acute on chronic hypoxia and hypercapnia with respiratory failure.  Her respiratory PCR was positive for parainfluenza 3.  She has been intubated. ? ?Chronic hypoxia/hypercapnic respiratory failure combination of COPD/viral  illness with parainfluenza 3 and CHF. ?Rule out ARDS.  Rule out amiodarone induced hypersensitivity pneumonitis. ?Doubt she has bacterial pneumonia especially with procalcitonin being normal ? ?Tracheal aspirate culture had a saprophytic fungus.  Which is likely a contaminant.  Versus colonization.  Doubt it is there in her lungs because the BAL does not show that mold. ?If needed voriconazole can be used. ? ?Fever with increasing leukocytosis.  Likely because of the lung findings ?We will get blood culture ?UA ?Patient is currently on Unasyn.  We will discontinue that and change to meropenem and vancomycin until cultures are available.  Agree with doxycycline ? ?A-fib with RVR on amiodarone.  Consider discontinuing amiodarone because of hypersensitivity pneumonitis ? ?COPD exacerbation on Solu-Medrol ? ?Severe hypercapnia on ABG ?Discussed the management with the intensivist and the pharmacist. ?

## 2021-11-10 NOTE — Consult Note (Signed)
PHARMACY CONSULT NOTE ? ?Pharmacy Consult for Electrolyte Monitoring and Replacement  ? ?Recent Labs: ?Potassium (mmol/L)  ?Date Value  ?11/10/2021 4.9  ?04/05/2014 4.6  ? ?Magnesium (mg/dL)  ?Date Value  ?11/10/2021 2.8 (H)  ? ?Calcium (mg/dL)  ?Date Value  ?11/10/2021 7.3 (L)  ? ?Calcium, Total (mg/dL)  ?Date Value  ?04/05/2014 8.2 (L)  ? ?Albumin (g/dL)  ?Date Value  ?11/10/2021 2.5 (L)  ? ?Phosphorus (mg/dL)  ?Date Value  ?11/10/2021 2.2 (L)  ? ?Sodium (mmol/L)  ?Date Value  ?11/10/2021 143  ?04/05/2014 141  ? ?Assessment: ?Patient is a 61 y.o. female with medical history including HFpEF, COPD, tobacco use disorder admitted on 10/27/2021 with acute on chronic respiratory failure. Admission complicated by severe metabolic derangements requiring transfer to ICU and ultimately intubation on 5/2. Pharmacy consulted to assist with electrolyte monitoring and replacement as indicated. ? ?MIVF: NS at 75 cc/hr (started for diuretic induced contraction alkalosis) >> stopped 5/3 ?Nutrition: Tube feeds started 5/2 ? ?Goal of Therapy:  ?Electrolytes within normal limits ? ?Plan:  ?--K 4.9, Phos 2.2; IV sodium phosphate 15 mmol x 1 per PCCM ?--Follow-up electrolytes with AM labs tomorrow ? ?Tressie Ellis ?11/10/2021 8:15 AM  ?

## 2021-11-10 NOTE — Progress Notes (Signed)
? ?Progress Note ? ?Patient Name: Virginia Norman ?Date of Encounter: 11/10/2021 ? ?[facility] Cardiologist: Ida Rogue, MD  ? ?Subjective  ? ?Intubated and sedated. Daughter at bedside. Still in afib with suboptimally controlled rates.  ? ?Inpatient Medications  ?  ?Scheduled Meds: ? apixaban  5 mg Per Tube BID  ? chlorhexidine gluconate (MEDLINE KIT)  15 mL Mouth Rinse BID  ? Chlorhexidine Gluconate Cloth  6 each Topical Daily  ? docusate  100 mg Per Tube BID  ? free water  30 mL Per Tube Q4H  ? insulin aspart  0-20 Units Subcutaneous Q4H  ? ipratropium-albuterol  3 mL Nebulization Q6H  ? mouth rinse  15 mL Mouth Rinse 10 times per day  ? [START ON 11/13/2021] methylPREDNISolone (SOLU-MEDROL) injection  20 mg Intravenous Daily  ? [START ON 11/11/2021] methylPREDNISolone (SOLU-MEDROL) injection  40 mg Intravenous Daily  ? pantoprazole sodium  40 mg Per Tube Daily  ? polyethylene glycol  17 g Oral BID  ? senna  2 tablet Per Tube BID  ? sodium chloride flush  10-40 mL Intracatheter Q12H  ? ?Continuous Infusions: ? amiodarone 15 mg/hr (11/10/21 0931)  ? ampicillin-sulbactam (UNASYN) IV Stopped (11/10/21 1610)  ? anidulafungin 200 mg (11/10/21 1024)  ? doxycycline (VIBRAMYCIN) IV 100 mg (11/10/21 1029)  ? feeding supplement (VITAL AF 1.2 CAL) 1,000 mL (11/10/21 0500)  ? fentaNYL infusion INTRAVENOUS 150 mcg/hr (11/10/21 0800)  ? norepinephrine (LEVOPHED) Adult infusion    ? phenylephrine (NEO-SYNEPHRINE) Adult infusion 95 mcg/min (11/10/21 0800)  ? propofol (DIPRIVAN) infusion 40 mcg/kg/min (11/10/21 1030)  ? vasopressin 0.03 Units/min (11/10/21 0800)  ? ?PRN Meds: ?acetaminophen **OR** acetaminophen, bisacodyl, fentaNYL, magnesium hydroxide, metoprolol tartrate, ondansetron **OR** ondansetron (ZOFRAN) IV, sodium chloride flush  ? ?Vital Signs  ?  ?Vitals:  ? 11/10/21 0800 11/10/21 0900 11/10/21 1000 11/10/21 1100  ?BP: (!) 94/51 (!) 96/52 94/65 (!) 91/55  ?Pulse: (!) 114 (!) 108 (!) 108 (!) 103  ?Resp: 20      ?Temp: (!) 101.8 ?F (38.8 ?C)     ?TempSrc: Oral     ?SpO2: 90% (!) 88% 96% (!) 87%  ?Weight:      ?Height:      ? ? ?Intake/Output Summary (Last 24 hours) at 11/10/2021 1205 ?Last data filed at 11/10/2021 0930 ?Gross per 24 hour  ?Intake 3527.47 ml  ?Output 2285 ml  ?Net 1242.47 ml  ? ? ?  11/10/2021  ?  5:00 AM 11/09/2021  ?  5:00 AM 11/08/2021  ?  5:00 AM  ?Last 3 Weights  ?Weight (lbs) 208 lb 1.8 oz 205 lb 11 oz 200 lb 9.9 oz  ?Weight (kg) 94.4 kg 93.3 kg 91 kg  ?   ? ?Telemetry  ?  ?Afib HR around 100, up to 110 - Personally Reviewed ? ?ECG  ?  ?No new - Personally Reviewed ? ?Physical Exam  ? ?GEN: No acute distress.   ?Neck: No JVD ?Cardiac: Irreg Irreg, no murmurs, rubs, or gallops.  ?Respiratory: wheezing, course breath sounds ?GI: Soft, nontender, non-distended  ?MS: No edema; No deformity. ?Neuro:  Nonfocal  ?Psych: Normal affect  ? ?Labs  ?  ?High Sensitivity Troponin:   ?Recent Labs  ?Lab 10/27/2021 ?1904 10/20/2021 ?2118 11/08/21 ?9604  ?TROPONINIHS 18* 19* 138*  ?   ?Chemistry ?Recent Labs  ?Lab 11/04/21 ?5409 11/05/21 ?8119 11/08/21 ?1478 11/08/21 ?2132 11/09/21 ?2956 11/10/21 ?2130  ?NA 139   < > 138  --  143 143  ?K 4.5   < >  4.6  --  4.7 4.9  ?CL 90*   < > 93*  --  96* 97*  ?CO2 43*   < > 41*  --  41* 41*  ?GLUCOSE 158*   < > 194*  --  183* 168*  ?BUN 43*   < > 61*  --  58* 70*  ?CREATININE 1.04*   < > 0.59  --  0.56 0.67  ?CALCIUM 8.1*   < > 7.9*  --  7.4* 7.3*  ?MG 2.5*   < >  --  2.7* 2.8* 2.8*  ?PROT  --   --   --   --  5.2* 4.8*  ?ALBUMIN 3.0*  --   --   --  2.6* 2.5*  ?AST  --   --   --   --  47* 58*  ?ALT  --   --   --   --  61* 70*  ?ALKPHOS  --   --   --   --  61 35*  ?BILITOT  --   --   --   --  0.7 0.9  ?GFRNONAA >60   < > >60  --  >60 >60  ?ANIONGAP 6   < > 4*  --  6 5  ? < > = values in this interval not displayed.  ?  ?Lipids  ?Recent Labs  ?Lab 11/08/21 ?9326  ?TRIG 142  ?  ?Hematology ?Recent Labs  ?Lab 11/08/21 ?7124 11/09/21 ?5809 11/10/21 ?9833  ?WBC 21.7* 25.9* 38.3*  39.0*  ?RBC 3.14*  2.65* 2.38*  2.37*  ?HGB 9.0* 7.8* 6.9*  7.0*  ?HCT 30.1* 26.6* 24.6*  24.4*  ?MCV 95.9 100.4* 103.4*  103.0*  ?MCH 28.7 29.4 29.0  29.5  ?MCHC 29.9* 29.3* 28.0*  28.7*  ?RDW 14.0 14.7 15.0  14.9  ?PLT 283 308 355  347  ? ?Thyroid  ?Recent Labs  ?Lab 11/04/21 ?0611  ?TSH 0.851  ?FREET4 0.57*  ?  ?BNP ?Recent Labs  ?Lab 11/10/21 ?0953  ?BNP 1,203.4*  ?  ?DDimer No results for input(s): DDIMER in the last 168 hours.  ? ?Radiology  ?  ?DG Abd 1 View ? ?Result Date: 11/10/2021 ?CLINICAL DATA:  Abdominal distention. EXAM: ABDOMEN - 1 VIEW COMPARISON:  None Available. FINDINGS: The bowel gas pattern is nonobstructive with mild-to-moderate stool retention large intestine. NGT is in place with the tip projected at the body of the stomach angled slightly left. No radio-opaque calculi or other significant radiographic abnormality are seen, but there is limited image detail in the abdomen due to the patient not being able to lie flat. IMPRESSION: 1. NGT tip in the body of stomach. 2. Constipation with no acute radiographic findings.  Limited exam. Electronically Signed   By: Telford Nab M.D.   On: 11/10/2021 05:32  ? ?DG Chest Port 1 View ? ?Result Date: 11/09/2021 ?CLINICAL DATA:  Respiratory failure.  History of CHF. EXAM: PORTABLE CHEST 1 VIEW COMPARISON:  Chest radiograph dated 11/08/2021. FINDINGS: Endotracheal tube with tip approximately 4.5 cm above the carina. Left IJ central venous line with tip over central SVC. Enteric tube extends below the diaphragm with tip over the gastric air. No significant interval change in bilateral pulmonary opacities compared to the prior radiograph. Small bilateral pleural effusions. No pneumothorax. Stable cardiac silhouette. Atherosclerotic calcification of the aorta. No acute osseous pathology. Degenerative changes spine. IMPRESSION: 1. No significant interval change in the bilateral pulmonary opacities compared to the prior radiograph. 2. Small bilateral  pleural effusions.  Electronically Signed   By: Anner Crete M.D.   On: 11/09/2021 19:20  ? ?US Abdomen Limited RUQ (LIVER/GB) ? ?Result Date: 11/10/2021 ?CLINICAL DATA:  61 year old female with abnormal LFTs. EXAM: ULTRASOUND ABDOMEN LIMITED RIGHT UPPER QUADRANT COMPARISON:  CTA chest 07/17/2021. FINDINGS: Gallbladder: No gallstones or wall thickening visualized. No sonographic Percell Miller sign could be evaluated as the patient is intubated. Common bile duct: Diameter: 5 mm, normal. Liver: Liver echogenicity is within normal limits. Lobulated liver contour appears similar to that in [place] no definite liver capsule nodularity. No discrete liver lesion or intrahepatic biliary ductal dilatation. Portal vein is patent on color Doppler imaging with normal direction of blood flow towards the liver. Other: Negative visible right kidney.  No free fluid. IMPRESSION: 1. Ultrasound appearance of the liver is within normal limits. 2. Negative gallbladder. No bile duct dilatation. Electronically Signed   By: Genevie Ann M.D.   On: 11/10/2021 05:57   ? ?Cardiac Studies  ? ?Echocardiogram ? 1. Left ventricular ejection fraction, by estimation, is 50 to 55%. The  ?left ventricle has low normal function. The left ventricle has no regional  ?wall motion abnormalities. Left ventricular diastolic parameters are  ?consistent with Grade I diastolic  ?dysfunction (impaired relaxation).  ? 2. Right ventricular systolic function is normal. The right ventricular  ?size is normal.  ? 3. The mitral valve is normal in structure. Trivial mitral valve  ?regurgitation. No evidence of mitral stenosis.  ? 4. The aortic valve is normal in structure. Aortic valve regurgitation is  ?not visualized. Aortic valve sclerosis is present, with no evidence of  ?aortic valve stenosis.  ? 5. The inferior vena cava is normal in size with greater than 50%  ?respiratory variability, suggesting right atrial pressure of 3 mmHg.  ? ?Patient Profile  ?   ?61 y.o. female with history  of HFpEF, COPD, and HTN admitted with acute on chronic hypoxic and hypercapnic respiratory failure secondary to COPD exacerbation and parainfluenza who we are seeing for Afib with RVR. ? ?Assessment & Plan  ?  ?Paroxysmal

## 2021-11-10 NOTE — Progress Notes (Signed)
PCCM Update: ? ?Patient with left pleural effusion. Will hold eliquis and start heparin drip in anticipation of thoracentesis to drain left pleural effusion.  ? ?Melody Comas, MD ?[facility] ?Office: [phone] ? ? ?See Amion for personal pager ?PCCM on call pager [phone] until 7pm. ?Please call Elink 7p-7a. [phone] ? ?

## 2021-11-10 NOTE — IPAL (Signed)
?  Interdisciplinary Goals of Care Family Meeting ? ? ?Date carried out: 11/10/2021 ? ?Location of the meeting: Bedside ? ?Member's involved: Physician and Family Member or next of kin ? ?Durable Power of Insurance risk surveyor: Husband ? ?Discussion: We discussed goals of care for [facility] .  I informed patient's husband, Virginia Norman, at the bedside about her progressive decline and that if she were to suffer cardiac arrest that she would not survive that event given her decline in lung function and ARDS. He expressed understanding and would like time to consider a code status change.  ? ?Code status: Full Code ? ?Disposition: Continue current acute care ? ?Time spent for the meeting: 15 minutes ? ? ? ?Martina Sinner, MD ? ?11/10/2021, 6:53 PM ? ? ?

## 2021-11-10 NOTE — IPAL (Signed)
?  Interdisciplinary Goals of Care Family Meeting ? ? ?Date carried out:: 11/10/2021 ? ?Location of the meeting: Bedside ? ?Member's involved: Physician, Bedside Registered Nurse, and Family Member or next of kin ? ?Durable Power of Attorney or acting medical decision maker: Virginia Norman (husband), Daughter and Grandaughter  ? ?Discussion: We discussed goals of care for [name] .  Virginia Norman and family had a chance to consider earlier discussion with the day team and they have decided to transition code status to no CPR or code blue. Continue vent support for now. Orders entered  ? ?Code status: Limited Code or DNR with short term intubation ? ?Disposition: Continue current acute care ? ? ?Time spent for the meeting: 5 mins ? ?Marshell Garfinkel MD ?[facility] ?11/10/2021, 7:57 PM  ?

## 2021-11-10 NOTE — Progress Notes (Signed)
Nutrition Follow Up Note  ? ?DOCUMENTATION CODES:  ? ?Not applicable ? ?INTERVENTION:  ? ?Continue Vital 1.2@65ml /hr continuous  ? ?Propofol: 16.3 ml/hr- provides 430kcal/day ? ?Free water flushes 15m q4 hours to maintain tube patency  ? ?Regimen provides 2302kcal/day, 117g/day protein and 14455mday of free water.  ? ?NUTRITION DIAGNOSIS:  ? ?Inadequate oral intake related to inability to eat (pt sedated and ventilated) as evidenced by NPO status ? ?GOAL:  ? ?Provide needs based on ASPEN/SCCM guidelines ?-met  ? ?MONITOR:  ? ?Vent status, Labs, Weight trends, TF tolerance, Skin, I & O's ? ?ASSESSMENT:  ? ?[age]/o female with h/o CAD, Afib, CHF, COPD and HTN who is admitted with COPD exacerbation, parainfluenza and Afib with RVR. ? ?Pt remains sedated and ventilated. OGT in place. Pt tolerating tube feeds well at goal rate. Pt is refeeding; electrolytes being replaced. Per chart, pt is up ~29lbs since admission; pt +13.6L on her I & Os. Pt is being diuresed today. No BM noted since 4/27. KUB reports moderate stool burden; MD aware and is providing bowel regimen.  ? ?Medications reviewed and include: colace, insulin, solu-medrol, protonix, senokot, doxycycline, levophed, phenylephrine, propofol, vasopressin  ? ?Labs reviewed: K 4.9 wnl, Cl 97(L), BUN 70(H), P 2.2(L), Mg 2.8(H) ?BNP- 1203.4(H) ?Wbc- 38.3(H), Hgb 6.9(L), Hct 24.6(L) ?Cbgs- 200, 162, 162 x 24 hrs ? ?Patient is currently intubated on ventilator support ?MV: 9.8 L/min ?Temp (24hrs), Avg:101.8 ?F (38.8 ?C), Min:99.8 ?F (37.7 ?C), Max:103.1 ?F (39.5 ?C) ? ?Propofol: 16.3 ml/hr- provides 430kcal/day ? ?MAP- >6559m  ? ?UOP- 2000m59m ?Diet Order:   ?Diet Order   ? ? None  ? ?  ? ?EDUCATION NEEDS:  ? ?No education needs have been identified at this time ? ?Skin:  Skin Assessment: Reviewed RN Assessment ? ?Last BM:  4/27- type 5 ? ?Height:  ? ?Ht Readings from Last 1 Encounters:  ?10/07/2021 5' 7"  (1.702 m)  ? ? ?Weight:  ? ?Wt Readings from Last 1 Encounters:   ?11/10/21 94.4 kg  ? ? ?Ideal Body Weight:  61.36 kg ? ?BMI:  Body mass index is 32.6 kg/m?. ? ?Estimated Nutritional Needs:  ? ?Kcal:  2040kcal/day ? ?Protein:  110-125g/day ? ?Fluid:  1.9-2.2L/day ? ?CaseKoleen Distance RD, LDN ?Please refer to AMION for RD and/or RD on-call/weekend/after hours pager ? ?

## 2021-11-10 NOTE — Progress Notes (Addendum)
? ?NAME:  Virginia Norman, MRN:  [id number], DOB:  1960-10-23, LOS: 13 ?ADMISSION DATE:  10/08/2021, CONSULTATION DATE:  11/03/21 ?REFERRING MD:  Cipriano Bunker, MD CHIEF COMPLAINT:  Respiratory Failure  ? ?History of Present Illness:  ?Is a 61 year old woman with a medical history of CHF with preserved ejection fraction, COPD and ongoing tobacco abuse, who was admitted on 29 October 2021 with acute on chronic respiratory failure with hypoxia and hypercarbia.  She has been managed in the progressive care unit.  She presented with a working diagnosis of decompensation CHF.  She received diuretics until 4/30.  She was transferred to the intensive care unit due to need for noninvasive ventilation and encephalopathy likely due to her respiratory failure.  She was noted to have severe metabolic derangements to include very low chloride, very high bicarbonate and high PaCO2.  She has significant hemoconcentration on CBC.  We are asked to assist in her management. ? ?Pertinent  Medical History  ?COPD ?CHF with preserved EF ?CAD ?HTN ? ?Significant Hospital Events: ?Including procedures, antibiotic start and stop dates in addition to other pertinent events   ?11/02/2021 transferred to ICU due to severe hypercapnia and severe metabolic derangements ?11/03/2021 requiring AVAPS, stopped all diuretics, Diamox, sodium chloride infusion ?11/03/2021 patient required intubation in the evening CT head negative for acute CNS process ?11/04/2021 on mechanical ventilation ?5/3 remains on vent plan for weaning trial ?5/4 remains on vent severe COPD ?5/5 failure to wean from vent, failed SAT/SBT ?5/5 s/p bronch bloody mucus plugs aspirated-cultures sent ?5/6 severe Hypoxia unable to wean, husband updated ?5/7 severe Hypoxia ? ?Interim History / Subjective:  ? ?Had episode of desaturations overnight.  ?Vent changed overnight, PEEP from 12 to 8, remains on FiO2 85% ?Husband at bedside, all questions answered ? ?Objective   ?Blood pressure (!) 94/51,  pulse (!) 114, temperature (!) 101.8 ?F (38.8 ?C), temperature source Oral, resp. rate 20, height 5\' 7"  (1.702 m), weight 94.4 kg, SpO2 90 %. ?CVP:  [13 mmHg-23 mmHg] 17 mmHg  ?Vent Mode: PRVC ?FiO2 (%):  [70 %-85 %] 85 % ?Set Rate:  [12 bmp-20 bmp] 20 bmp ?Vt Set:  [450 mL-500 mL] 500 mL ?PEEP:  [8 cmH20-12 cmH20] 8 cmH20 ?Plateau Pressure:  [28 cmH20-29 cmH20] 28 cmH20  ? ?Intake/Output Summary (Last 24 hours) at 11/10/2021 0931 ?Last data filed at 11/10/2021 0930 ?Gross per 24 hour  ?Intake 4048.52 ml  ?Output 2285 ml  ?Net 1763.52 ml  ? ?Filed Weights  ? 11/08/21 0500 11/09/21 0500 11/10/21 0500  ?Weight: 91 kg 93.3 kg 94.4 kg  ? ? ?Examination: ?General: critically ill woman, intubated, sedated ?HENT: [place]/AT, moist mucous membranes, ET tube in place ?Lungs: course diminished breath sounds, no wheezing.  ?Cardiovascular: tachycardic, irregularly irregular, no murmurs ?Abdomen: soft, non-distended, BS+ ?Extremities: cool to touch, trace edema ?Neuro: sedated, PERRL ?GU: no foley ? ?Resolved Hospital Problem list   ? ? ?Assessment & Plan:  ? ?Acute Hypoxemic and Hypercapnic respiratory Failure ?ARDS, P/F ratio 104 5/8 ?Parainfluenza 3 Viral Pneumonia ?Possible Fungal Pneumonia, Mold on respiratory culture 5/2 ?Hx of COPD with Exacerbation ?- continue Mechanical Ventilator support ?- Wean Fio2 and PEEP as tolerated ?- VAP/VENT bundle implementation ?- Wean PEEP & FiO2 as tolerated, maintain SpO2 > 88% ?- Head of bed elevated 30 degrees, VAP protocol in place ?- Plateau pressures less than 30 cm H20  ?- Ensure adequate pulmonary hygiene  ?- Propofol + fentanyl for sedation ?- Will diurese with lasix + metolazone today ?-  Taper solumedrol over next few days ?- Start doxycycline for atypical pneumonia coverage and micafungin for fungal coverage.  ?- Stop amiodarone incase it is contributing to lung toxicity ? ?Multifactorial Shock ?Atrial Fibrillation w/ RVR ?Concern for septic shock due to pneumonia with possible  cardiogenic component ?- Start levophed, wean neosynephrine off. Continue vasopressin ?- Check limited Echo ?- Check BNP ?- Cardiology following ?- Will stop amiodarone drip to do concern for lung toxicity ?- Will consider digoxin dosing or discuss cardioversion with Cardiology ? ?Acute Metabolic Encephalopathy ?In setting of sepsis and respiratory failure ?- continue sedation for above ?- Rass goal -2 to -3 ?- Will obtain head imaging if safe to travel ? ?Hypophosphatemia ?- replete PRN ? ?Nutrition ?- Continue tube feeds per dietician team ? ?Best Practice (right click and "Reselect all SmartList Selections" daily)  ? ?Diet/type: tubefeeds ?DVT prophylaxis: DOAC ?GI prophylaxis: PPI ?Lines: Central line, Arterial Line, and yes and it is still needed ?Foley:  N/A ?Code Status:  full code ?Last date of multidisciplinary goals of care discussion [5/8, updated patient's husband Fayrene FearingJames at the bedside] ? ?Labs   ?CBC: ?Recent Labs  ?Lab 11/04/21 ?0611 11/06/21 ?[id number] 11/07/21 ?[id number] 11/08/21 ?[id number] 11/09/21 ?[id number] 11/10/21 ?[id number]  ?WBC 21.5* 15.8* 16.6* 21.7* 25.9* 39.0*  ?NEUTROABS 18.0*  --   --   --   --   --   ?HGB 13.6 10.9* 10.2* 9.0* 7.8* 7.0*  ?HCT 45.1 36.2 33.2* 30.1* 26.6* 24.4*  ?MCV 96.6 94.8 94.6 95.9 100.4* 103.0*  ?PLT 300 208 252 283 308 347  ? ? ?Basic Metabolic Panel: ?Recent Labs  ?Lab 11/05/21 ?0635 11/06/21 ?[id number] 11/07/21 ?[id number] 11/08/21 ?[id number] 11/08/21 ?2132 11/09/21 ?[id number] 11/10/21 ?[id number]  ?NA 134* 135 136 138  --  143 143  ?K 4.1 4.4 4.7 4.6  --  4.7 4.9  ?CL 91* 93* 91* 93*  --  96* 97*  ?CO2 38* 39* 37* 41*  --  41* 41*  ?GLUCOSE 169* 144* 202* 194*  --  183* 168*  ?BUN 38* 34* 52* 61*  --  58* 70*  ?CREATININE 0.59 0.60 0.76 0.59  --  0.56 0.67  ?CALCIUM 7.7* 7.2* 7.9* 7.9*  --  7.4* 7.3*  ?MG 2.4 2.0  --   --  2.7* 2.8* 2.8*  ?PHOS 1.5* 2.8 2.7  --  1.9* 3.9 2.2*  ? ?GFR: ?Estimated Creatinine Clearance: 86 mL/min (by C-G formula based on SCr of 0.67 mg/dL). ?Recent Labs  ?Lab 11/07/21 ?[id number]  11/08/21 ?[id number] 11/09/21 ?[id number] 11/10/21 ?[id number]  ?PROCALCITON  --   --  0.18 0.19  ?WBC 16.6* 21.7* 25.9* 39.0*  ?LATICACIDVEN  --  1.2  --   --   ? ? ?Liver Function Tests: ?Recent Labs  ?Lab 11/04/21 ?[id number] 11/09/21 ?0452 11/10/21 ?[id number]  ?AST  --  47* 58*  ?ALT  --  61* 70*  ?ALKPHOS  --  41 35*  ?BILITOT  --  0.7 0.9  ?PROT  --  5.2* 4.8*  ?ALBUMIN 3.0* 2.6* 2.5*  ? ?No results for input(s): LIPASE, AMYLASE in the last 168 hours. ?No results for input(s): AMMONIA in the last 168 hours. ? ?ABG ?   ?Component Value Date/Time  ? PHART 7.3 (L) 11/10/2021 0505  ? PCO2ART 88 (HH) 11/10/2021 0505  ? PO2ART 67 (L) 11/10/2021 0505  ? HCO3 43.3 (H) 11/10/2021 0505  ? O2SAT 95 11/10/2021 0505  ?  ? ?Coagulation Profile: ?No results for input(s): INR, PROTIME in the last 168 hours. ? ?  Cardiac Enzymes: ?No results for input(s): CKTOTAL, CKMB, CKMBINDEX, TROPONINI in the last 168 hours. ? ?HbA1C: ?Hgb A1c MFr Bld  ?Date/Time Value Ref Range Status  ?07/17/2021 06:23 AM 5.8 (H) 4.8 - 5.6 % Final  ?  Comment:  ?  (NOTE) ?Pre diabetes:          5.7%-6.4% ? ?Diabetes:              >6.4% ? ?Glycemic control for   <7.0% ?adults with diabetes ?  ?10/03/2019 04:45 AM 6.3 (H) 4.8 - 5.6 % Final  ?  Comment:  ?  (NOTE) ?Pre diabetes:          5.7%-6.4% ?Diabetes:              >6.4% ?Glycemic control for   <7.0% ?adults with diabetes ?  ? ? ?CBG: ?Recent Labs  ?Lab 11/09/21 ?1536 11/09/21 ?1935 11/09/21 ?2308 11/10/21 ?0867 11/10/21 ?6195  ?GLUCAP 216* 173* 182* 162* 162*  ?  ? ?Critical care time: 50 minutes  ? ?Melody Comas, MD ?[facility] ?Office: [phone] ? ? ?See Amion for personal pager ?PCCM on call pager [phone] until 7pm. ?Please call Elink 7p-7a. [phone] ? ? ?  ?

## 2021-11-10 NOTE — Progress Notes (Signed)
eLink Physician-Brief Progress Note ?Patient Name: Virginia Norman ?DOB: 07/25/1960 ?MRN: [id number] ? ? ?Date of Service ? 11/10/2021  ?HPI/Events of Note ? Patient is paralyzed on the ventilator while receiving enteral nutrition.  ?eICU Interventions ? Okay to continue enteral nutrition.  ? ? ? ?  ? ?[name] ?11/10/2021, 9:45 PM ?

## 2021-11-10 NOTE — TOC Progression Note (Signed)
Transition of Care (TOC) - Progression Note  ? ? ?Patient Details  ?Name: Virginia Norman ?MRN: [id number] ?Date of Birth: 03-Mar-1961 ? ?Transition of Care (TOC) CM/SW Contact  ?Allayne Butcher, RN ?Phone Number: ?11/10/2021, 11:35 AM ? ?Clinical Narrative:    ?Patient remains in the ICU on the ventilator, sedated, requiring pressors to maintain hemodynamic stability.  Not a candidate for trach at this time due to instability.   ? ? ?Expected Discharge Plan: Home w Home Health Services ?Barriers to Discharge: Continued Medical Work up ? ?Expected Discharge Plan and Services ?Expected Discharge Plan: Home w Home Health Services ?  ?Discharge Planning Services: CM Consult ?  ?Living arrangements for the past 2 months: Single Family Home ?                ?  ?  ?  ?  ?  ?  ?  ?  ?  ?  ? ? ?Social Determinants of Health (SDOH) Interventions ?  ? ?Readmission Risk Interventions ?   ? View : No data to display.  ?  ?  ?  ? ? ?

## 2021-11-10 NOTE — Progress Notes (Signed)
Pharmacy Antibiotic Note ? ?Virginia Norman is a 61 y.o. female admitted on 11/01/2021 with pneumonia.  Pharmacy has been consulted for vancomycin and meropenem dosing. Patient with increasing need for vasopressors.  Respiratory virus panel on 4/26 detected parainfluenza 3 virus.  Tracheal aspirate from 5/2 with possible mold (? Colonizer) sent for identification.  Bronch performed 5/5 but culture revealed normal flora.  Plan to re-bronch 5/8. ID consulted 5/8 ? ?Today, 11/10/2021 ?Day 7 antibiotics, Day 5 ampicillin/sulbactam ?WBC 16.6 - trending up on steroids ?+ fevers ?Remains vent-dependent ?Norepinephrine added to phenylephrine and vasopressin ?Beta-D-glucan pending ?Amiodarone gtt stopped ?Plan: ?Vancomycin 1500mg  IV x 1 then 750mg  IV q12h for predicted AUC = 502 ?Goal AUC 400-600 ?Used SCr = 0.8mg /dl and Vd ?Meropenem 1gm IV q8h ?Follow renal function and cultures ?Check vancomycin levels if vancomycin continued > 4-5 days or sooner per renal function ? ?Height: 5\' 7"  (170.2 cm) ?Weight: 94.4 kg (208 lb 1.8 oz) ?IBW/kg (Calculated) : 61.6 ? ?Temp (24hrs), Avg:102.1 ?F (38.9 ?C), Min:100.6 ?F (38.1 ?C), Max:103.1 ?F (39.5 ?C) ? ?Recent Labs  ?Lab 11/06/21 ?2.02 11/07/21 ?01/06/22 11/08/21 ?01/07/22 11/09/21 ?01/08/22 11/10/21 ?01/09/22  ?WBC 15.8* 16.6* 21.7* 25.9* 38.3*  39.0*  ?CREATININE 0.60 0.76 0.59 0.56 0.67  ?LATICACIDVEN  --   --  1.2  --   --   ?  ?Estimated Creatinine Clearance: 86 mL/min (by C-G formula based on SCr of 0.67 mg/dL).   ? ?No Known Allergies ? ?Antimicrobials this admission: ?5/2 Vanc + Cefepime >> 5/4 ?5/4 Unasyn >> 5/8 ?5/8 Anidulafungin x 1 ?5/8 Doxycycline >> ? ?Dose adjustments this admission: ? ? ?Microbiology results: ?5/8 blood cx: ?5/5 respiratory cx: Normal flora ?5/2 Resp cx: rare mold ?4/30 PCR: negative ? ?Thank you for allowing pharmacy to be a part of this patient?s care. ? ?7/8, PharmD, BCPS, BCIDP ?Work Cell: [phone] ?11/10/2021 4:15 PM ? ? ? ?

## 2021-11-11 ENCOUNTER — Ambulatory Visit: Payer: Self-pay | Admitting: Family

## 2021-11-11 ENCOUNTER — Inpatient Hospital Stay: Payer: Self-pay

## 2021-11-11 DIAGNOSIS — Z7189 Other specified counseling: Secondary | ICD-10-CM

## 2021-11-11 DIAGNOSIS — I4891 Unspecified atrial fibrillation: Secondary | ICD-10-CM

## 2021-11-11 LAB — BASIC METABOLIC PANEL
Anion gap: 7 (ref 5–15)
Anion gap: 11 (ref 5–15)
BUN: 105 mg/dL — ABNORMAL HIGH (ref 8–23)
BUN: 112 mg/dL — ABNORMAL HIGH (ref 8–23)
CO2: 38 mmol/L — ABNORMAL HIGH (ref 22–32)
CO2: 30 mmol/L (ref 22–32)
Calcium: 7.2 mg/dL — ABNORMAL LOW (ref 8.9–10.3)
Calcium: 6.6 mg/dL — ABNORMAL LOW (ref 8.9–10.3)
Chloride: 93 mmol/L — ABNORMAL LOW (ref 98–111)
Chloride: 95 mmol/L — ABNORMAL LOW (ref 98–111)
Creatinine, Ser: 1.67 mg/dL — ABNORMAL HIGH (ref 0.44–1.00)
Creatinine, Ser: 2.81 mg/dL — ABNORMAL HIGH (ref 0.44–1.00)
GFR, Estimated: 34 mL/min — ABNORMAL LOW (ref >60)
GFR, Estimated: 18 mL/min — ABNORMAL LOW (ref >60)
Glucose, Bld: 143 mg/dL — ABNORMAL HIGH (ref 70–99)
Glucose, Bld: 148 mg/dL — ABNORMAL HIGH (ref 70–99)
Potassium: 5.5 mmol/L — ABNORMAL HIGH (ref 3.5–5.1)
Potassium: 6.5 mmol/L (ref 3.5–5.1)
Sodium: 138 mmol/L (ref 135–145)
Sodium: 136 mmol/L (ref 135–145)

## 2021-11-11 LAB — BLOOD GAS, ARTERIAL
Acid-Base Excess: 6.4 mmol/L — ABNORMAL HIGH (ref 0.0–2.0)
Bicarbonate: 37.3 mmol/L — ABNORMAL HIGH (ref 20.0–28.0)
FIO2: 100 %
MECHVT: 420 mL
O2 Saturation: 97.7 %
PEEP: 10 cmH2O
Patient temperature: 37
RATE: 26 resp/min
pCO2 arterial: 89 mmHg (ref 32–48)
pH, Arterial: 7.23 — ABNORMAL LOW (ref 7.35–7.45)
pO2, Arterial: 80 mmHg — ABNORMAL LOW (ref 83–108)

## 2021-11-11 LAB — PREPARE RBC (CROSSMATCH)

## 2021-11-11 LAB — HEPARIN LEVEL (UNFRACTIONATED)
Heparin Unfractionated: >1.1 IU/mL — ABNORMAL HIGH (ref 0.30–0.70)
Heparin Unfractionated: >1.1 IU/mL — ABNORMAL HIGH (ref 0.30–0.70)

## 2021-11-11 LAB — COMPREHENSIVE METABOLIC PANEL
ALT: 513 U/L — ABNORMAL HIGH (ref 0–44)
AST: 526 U/L — ABNORMAL HIGH (ref 15–41)
Albumin: 2.4 g/dL — ABNORMAL LOW (ref 3.5–5.0)
Alkaline Phosphatase: 37 U/L — ABNORMAL LOW (ref 38–126)
Anion gap: 11 (ref 5–15)
BUN: 116 mg/dL — ABNORMAL HIGH (ref 8–23)
CO2: 35 mmol/L — ABNORMAL HIGH (ref 22–32)
Calcium: 7.2 mg/dL — ABNORMAL LOW (ref 8.9–10.3)
Chloride: 91 mmol/L — ABNORMAL LOW (ref 98–111)
Creatinine, Ser: 2.48 mg/dL — ABNORMAL HIGH (ref 0.44–1.00)
GFR, Estimated: 21 mL/min — ABNORMAL LOW (ref >60)
Glucose, Bld: 192 mg/dL — ABNORMAL HIGH (ref 70–99)
Potassium: 6.4 mmol/L (ref 3.5–5.1)
Sodium: 137 mmol/L (ref 135–145)
Total Bilirubin: 2.1 mg/dL — ABNORMAL HIGH (ref 0.3–1.2)
Total Protein: 4.6 g/dL — ABNORMAL LOW (ref 6.5–8.1)

## 2021-11-11 LAB — CBC
HCT: 20.5 % — ABNORMAL LOW (ref 36.0–46.0)
Hemoglobin: 6.2 g/dL — ABNORMAL LOW (ref 12.0–15.0)
MCH: 30.2 pg (ref 26.0–34.0)
MCHC: 30.2 g/dL (ref 30.0–36.0)
MCV: 100 fL (ref 80.0–100.0)
Platelets: 336 10*3/uL (ref 150–400)
RBC: 2.05 MIL/uL — ABNORMAL LOW (ref 3.87–5.11)
RDW: 15.1 % (ref 11.5–15.5)
WBC: 56.7 10*3/uL (ref 4.0–10.5)
nRBC: 11.5 % — ABNORMAL HIGH (ref 0.0–0.2)

## 2021-11-11 LAB — ANA W/REFLEX IF POSITIVE: Anti Nuclear Antibody (ANA): NEGATIVE

## 2021-11-11 LAB — GLUCOSE, CAPILLARY
Glucose-Capillary: 141 mg/dL — ABNORMAL HIGH (ref 70–99)
Glucose-Capillary: 147 mg/dL — ABNORMAL HIGH (ref 70–99)
Glucose-Capillary: 149 mg/dL — ABNORMAL HIGH (ref 70–99)
Glucose-Capillary: 152 mg/dL — ABNORMAL HIGH (ref 70–99)
Glucose-Capillary: 156 mg/dL — ABNORMAL HIGH (ref 70–99)
Glucose-Capillary: 181 mg/dL — ABNORMAL HIGH (ref 70–99)

## 2021-11-11 LAB — APTT
aPTT: 150 seconds — ABNORMAL HIGH (ref 24–36)
aPTT: 193 seconds (ref 24–36)
aPTT: 63 seconds — ABNORMAL HIGH (ref 24–36)

## 2021-11-11 LAB — RHEUMATOID FACTOR: Rheumatoid fact SerPl-aCnc: <10 IU/mL (ref <14.0)

## 2021-11-11 LAB — HEMOGLOBIN: Hemoglobin: 6.7 g/dL — ABNORMAL LOW (ref 12.0–15.0)

## 2021-11-11 LAB — PROCALCITONIN: Procalcitonin: 0.87 ng/mL

## 2021-11-11 LAB — FUNGITELL, SERUM: Fungitell Result: <31 pg/mL (ref <80)

## 2021-11-11 LAB — PHOSPHORUS: Phosphorus: 4.3 mg/dL (ref 2.5–4.6)

## 2021-11-11 LAB — ABO/RH: ABO/RH(D): O POS

## 2021-11-11 LAB — TRIGLYCERIDES: Triglycerides: 207 mg/dL — ABNORMAL HIGH (ref <150)

## 2021-11-11 MED ORDER — SODIUM CHLORIDE 0.9% IV SOLUTION: Freq: Once | INTRAVENOUS | Status: DC

## 2021-11-11 MED ORDER — SODIUM ZIRCONIUM CYCLOSILICATE 5 G PO PACK
5.0000 g | PACK | Freq: Once | ORAL | Status: AC
  Administered 2021-11-11: 5 g via ORAL
  Filled 2021-11-11: qty 1

## 2021-11-11 MED ORDER — DEXTROSE 50 % IV SOLN
1.0000 | Freq: Once | INTRAVENOUS | Status: AC
  Administered 2021-11-12: 50 mL via INTRAVENOUS
  Filled 2021-11-11: qty 50

## 2021-11-11 MED ORDER — SODIUM CHLORIDE 0.9 % IV SOLN
1.0000 g | Freq: Two times a day (BID) | INTRAVENOUS | Status: DC
  Administered 2021-11-11 – 2021-11-12 (×2): 1 g via INTRAVENOUS
  Filled 2021-11-11: qty 20
  Filled 2021-11-11: qty 1

## 2021-11-11 MED ORDER — VANCOMYCIN VARIABLE DOSE PER UNSTABLE RENAL FUNCTION (PHARMACIST DOSING)
Status: DC
Start: 2021-11-11 — End: 2021-11-12

## 2021-11-11 MED ORDER — HEPARIN (PORCINE) 25000 UT/250ML-% IV SOLN
1300.0000 [IU]/h | INTRAVENOUS | Status: DC
  Administered 2021-11-11: 1300 [IU]/h via INTRAVENOUS
  Filled 2021-11-11: qty 250

## 2021-11-11 MED ORDER — SODIUM ZIRCONIUM CYCLOSILICATE 5 G PO PACK
10.0000 g | PACK | Freq: Three times a day (TID) | ORAL | Status: DC
  Administered 2021-11-12: 10 g
  Filled 2021-11-11: qty 2

## 2021-11-11 MED ORDER — INSULIN ASPART 100 UNIT/ML IV SOLN
10.0000 [IU] | Freq: Once | INTRAVENOUS | Status: AC
  Administered 2021-11-11: 10 [IU] via INTRAVENOUS
  Filled 2021-11-11 (×2): qty 0.1

## 2021-11-11 MED ORDER — HEPARIN (PORCINE) 25000 UT/250ML-% IV SOLN
1100.0000 [IU]/h | INTRAVENOUS | Status: DC
Start: 2021-11-11 — End: 2021-11-12

## 2021-11-11 MED ORDER — HEPARIN BOLUS VIA INFUSION
1200.0000 [IU] | Freq: Once | INTRAVENOUS | Status: AC
  Administered 2021-11-11: 1200 [IU] via INTRAVENOUS
  Filled 2021-11-11: qty 1200

## 2021-11-11 MED ORDER — INSULIN ASPART 100 UNIT/ML IV SOLN
5.0000 [IU] | Freq: Once | INTRAVENOUS | Status: AC
  Administered 2021-11-12: 5 [IU] via INTRAVENOUS
  Filled 2021-11-11 (×2): qty 0.05

## 2021-11-11 MED ORDER — DEXTROSE 50 % IV SOLN
1.0000 | Freq: Once | INTRAVENOUS | Status: AC
  Administered 2021-11-11: 50 mL via INTRAVENOUS
  Filled 2021-11-11: qty 50

## 2021-11-11 MED ORDER — NOREPINEPHRINE 16 MG/250ML-% IV SOLN
0.0000 ug/min | INTRAVENOUS | Status: DC
  Administered 2021-11-11 – 2021-11-12 (×3): 40 ug/min via INTRAVENOUS
  Filled 2021-11-11 (×3): qty 250

## 2021-11-11 MED ORDER — SODIUM CHLORIDE 0.9% IV SOLUTION: Freq: Once | INTRAVENOUS | Status: AC

## 2021-11-11 MED ORDER — ALBUTEROL SULFATE (2.5 MG/3ML) 0.083% IN NEBU
10.0000 mg | INHALATION_SOLUTION | Freq: Once | RESPIRATORY_TRACT | Status: AC
  Administered 2021-11-12: 10 mg via RESPIRATORY_TRACT
  Filled 2021-11-11: qty 12

## 2021-11-11 MED ORDER — SORBITOL 70 % SOLN
300.0000 mL | TOPICAL_OIL | Freq: Once | ORAL | Status: AC
  Administered 2021-11-11: 300 mL via RECTAL
  Filled 2021-11-11: qty 90

## 2021-11-11 MED ORDER — BISACODYL 10 MG RE SUPP
10.0000 mg | Freq: Once | RECTAL | Status: AC
Start: 2021-11-11 — End: 2021-11-11
  Administered 2021-11-11: 10 mg via RECTAL
  Filled 2021-11-11: qty 1

## 2021-11-11 NOTE — Consult Note (Addendum)
ANTICOAGULATION CONSULT NOTE - Consult ? ?Pharmacy Consult for Eliquis > Heparin gtt conversion ?Indication: atrial fibrillation ? ?No Known Allergies ? ?Patient Measurements: ?Height: 5\' 7"  (170.2 cm) ?Weight: 98.1 kg (216 lb 4.3 oz) ?IBW/kg (Calculated) : 61.6 ?Heparin Dosing Weight: 82.2kg ? ?Vital Signs: ?Temp: 102.9 ?F (39.4 ?C) (05/09 1543) ?BP: 105/68 (05/09 1600) ?Pulse Rate: 152 (05/09 1600) ? ?Labs: ?Recent Labs  ?  11/09/21 ?0452 11/10/21 ?01/10/22 11/10/21 ?1630 11/10/21 ?1755 11/10/21 ?2013 11/10/21 ?2343 11/11/21 ?0447 11/11/21 ?0720 11/11/21 ?1348  ?HGB 7.8* 6.9*  7.0*  --   --   --   --  6.2*  --  6.7*  ?HCT 26.6* 24.6*  24.4*  --   --   --   --  20.5*  --   --   ?PLT 308 355  347  --   --   --   --  336  --   --   ?APTT  --   --   --  29  --  63*  --  150*  --   ?LABPROT  --   --   --  19.0*  --   --   --   --   --   ?INR  --   --   --  1.6*  --   --   --   --   --   ?HEPARINUNFRC  --   --   --   --   --  >1.10*  --  >1.10*  --   ?CREATININE 0.56 0.67 1.06*  --   --   --  1.67*  --   --   ?TROPONINIHS  --   --   --  237* 280*  --   --   --   --   ? ? ? ?Estimated Creatinine Clearance: 42 mL/min (A) (by C-G formula based on SCr of 1.67 mg/dL (H)). ? ? ?Medications:  ?NKDA; Heparin Dosing Weight: 82.2kg ?PTA: None ?Inpatient: Eliquis 5mg  BID (4/28-5/08 0900) > Heparin gtt ? ?Assessment: ?[name] w/ h/o HFpEF, COPD, and HTN admitted with acute on chronic hypoxic and hypercapnic respiratory failure secondary to COPDE and parainfluenza who is being evaluated for Afib with RVR. Pharmacy has been consulted for conversion of Eliquis started this admission to heparin gtt. ? ?5/08 - PCCM: "Patient with left pleural effusion. Will hold eliquis and start heparin drip in anticipation of thoracentesis to drain left pleural effusion" ? ?Date Time aPTT/HL Rate/Comment ?5/08  2343 63s / >1.1 Subthera aPTT; 1250 > 1400 un/hr ?5/09 0720 150s / >1.1 Supratherapeutic aPTT ?5/09 1607 193   Supratherapeutic ?Baseline  Labs: ?aPTT - 29s ?INR - 1.6 (on eliquis) ?Hgb - 16.1>13.6>10.9>10.2 ?Plts - 279>300>208>252 ? ?Goal of Therapy:  ?Heparin level 0.3-0.7 units/ml ?aPTT 66-102 seconds ?Monitor platelets by anticoagulation protocol: Yes ?  ?Plan:  ?DOAC lab interference still present, last aPTT subthera and dose was inc'd ~2un/k/h, but now supratherapeutic at >150s. Will hold x1 hr and reduce to 1300 un/hr based on prior response. ? ?Results: ?5/9 @ 1607: aPTT = 193 ?Will hold heparin gtt x1 hr (1730-1830); then resume at reduced rate of 1100 units/hr.  ?HL and aPTT do not correlate so will continue to use aPTT to dose heparin. ?Will recheck HL and aPTT 6 hrs after rate change.  ? ? ?Idora Brosious Rodriguez-Guzman PharmD, BCPS ?11/11/2021 5:27 PM ? ? ? ?

## 2021-11-11 NOTE — Progress Notes (Signed)
eLink Physician-Brief Progress Note ?Patient Name: Virginia Norman ?DOB: December 22, 1960 ?MRN: [id number] ? ? ?Date of Service ? 11/11/2021  ?HPI/Events of Note ? Repeat K+ is 6.5.  ?eICU Interventions ? Hyperkalemia treatment protocol ordered.  ? ? ? ?  ? ?Virginia Norman ?11/11/2021, 11:41 PM ?

## 2021-11-11 NOTE — Progress Notes (Signed)
? ?Progress Note ? ?Patient Name: Virginia Norman ?Date of Encounter: 11/11/2021 ? ?[facility] Cardiologist: Ida Rogue, MD  ? ?Subjective  ? ?Patient remains critically ill, intubated, sedated, and on multiple pressors. ? ?Inpatient Medications  ?  ?Scheduled Meds: ? sodium chloride   Intravenous Once  ? artificial tears  1 application. Both Eyes Q8H  ? chlorhexidine gluconate (MEDLINE KIT)  15 mL Mouth Rinse BID  ? Chlorhexidine Gluconate Cloth  6 each Topical Daily  ? docusate  100 mg Per Tube BID  ? free water  30 mL Per Tube Q4H  ? furosemide  40 mg Intravenous Q12H  ? insulin aspart  0-20 Units Subcutaneous Q4H  ? ipratropium-albuterol  3 mL Nebulization Q6H  ? mouth rinse  15 mL Mouth Rinse 10 times per day  ? [START ON 11/13/2021] methylPREDNISolone (SOLU-MEDROL) injection  20 mg Intravenous Daily  ? methylPREDNISolone (SOLU-MEDROL) injection  40 mg Intravenous Daily  ? pantoprazole sodium  40 mg Per Tube Daily  ? polyethylene glycol  17 g Oral BID  ? senna  2 tablet Per Tube BID  ? sodium chloride flush  10-40 mL Intracatheter Q12H  ? vancomycin variable dose per unstable renal function (pharmacist dosing)   Does not apply See admin instructions  ? ?Continuous Infusions: ? doxycycline (VIBRAMYCIN) IV 100 mg (11/11/21 0930)  ? feeding supplement (VITAL AF 1.2 CAL) 1,000 mL (11/10/21 0500)  ? fentaNYL infusion INTRAVENOUS 125 mcg/hr (11/11/21 0846)  ? heparin 1,300 Units/hr (11/11/21 0933)  ? meropenem (MERREM) IV    ? norepinephrine (LEVOPHED) Adult infusion 22 mcg/min (11/11/21 0846)  ? phenylephrine (NEO-SYNEPHRINE) Adult infusion 100 mcg/min (11/11/21 0846)  ? propofol (DIPRIVAN) infusion 35 mcg/kg/min (11/11/21 0903)  ? vasopressin 0.03 Units/min (11/11/21 0846)  ? vecuronium (NORCURON) infusion 12m/100mL (1 mg/mL) 1 mcg/kg/min (11/11/21 0846)  ? ?PRN Meds: ?acetaminophen **OR** acetaminophen, bisacodyl, fentaNYL, magnesium hydroxide, metoprolol tartrate, ondansetron **OR** ondansetron (ZOFRAN)  IV, sodium chloride flush  ? ?Vital Signs  ?  ?Vitals:  ? 11/11/21 0900 11/11/21 0944 11/11/21 0955 11/11/21 1000  ?BP: (!) 89/72     ?Pulse: (!) 124 (!) 134 (!) 128 (!) 122  ?Resp: (!) 26 (!) 26 (!) 26 (!) 26  ?Temp: (!) 101.8 ?F (38.8 ?C) (!) 101.7 ?F (38.7 ?C) (!) 101.7 ?F (38.7 ?C) (!) 101.7 ?F (38.7 ?C)  ?TempSrc:      ?SpO2: 94% 91% 91% 90%  ?Weight:      ?Height:      ? ? ?Intake/Output Summary (Last 24 hours) at 11/11/2021 1029 ?Last data filed at 11/11/2021 0944 ?Gross per 24 hour  ?Intake 4143.52 ml  ?Output 1385 ml  ?Net 2758.52 ml  ? ? ?  11/11/2021  ?  5:00 AM 11/10/2021  ?  5:00 AM 11/09/2021  ?  5:00 AM  ?Last 3 Weights  ?Weight (lbs) 216 lb 4.3 oz 208 lb 1.8 oz 205 lb 11 oz  ?Weight (kg) 98.1 kg 94.4 kg 93.3 kg  ?   ? ?Telemetry  ?  ?Atrial fibrillation with rapid ventricular response, ventricular rates 110-130 bpm - Personally Reviewed ? ?ECG  ?  ?No new tracing ? ?Physical Exam  ? ?GEN: Intubated and sedated. ?Neck: Difficult to assess JVP due to positioning, body habitus, and support devices. ?Cardiac: Tachycardic and irregularly irregular rhythm without murmurs. ?Respiratory: Coarse breath sounds anteriorly. ?GI: Soft, nontender, non-distended  ?MS: 1-2+ pitting edema in both legs. ?Neuro: Intubated and sedated ?Psych: Unable to assess as the patient is intubated  and sedated ? ?Labs  ?  ?High Sensitivity Troponin:   ?Recent Labs  ?Lab 10/31/2021 ?1904 10/30/2021 ?2118 11/08/21 ?6222 11/10/21 ?1755 11/10/21 ?2013  ?TROPONINIHS 18* 19* 138* 237* 280*  ?   ?Chemistry ?Recent Labs  ?Lab 11/08/21 ?2132 11/09/21 ?0452 11/10/21 ?9798 11/10/21 ?1630 11/11/21 ?0447  ?NA  --  143 143 140 138  ?K  --  4.7 4.9 5.3* 5.5*  ?CL  --  96* 97* 94* 93*  ?CO2  --  41* 41* 42* 38*  ?GLUCOSE  --  183* 168* 215* 143*  ?BUN  --  58* 70* 88* 105*  ?CREATININE  --  0.56 0.67 1.06* 1.67*  ?CALCIUM  --  7.4* 7.3* 7.4* 7.2*  ?MG 2.7* 2.8* 2.8*  --   --   ?PROT  --  5.2* 4.8*  --   --   ?ALBUMIN  --  2.6* 2.5*  --   --   ?AST  --  47* 58*   --   --   ?ALT  --  61* 70*  --   --   ?ALKPHOS  --  41 35*  --   --   ?BILITOT  --  0.7 0.9  --   --   ?GFRNONAA  --  >60 >60 59* 34*  ?ANIONGAP  --  6 5 4* 7  ?  ?Lipids  ?Recent Labs  ?Lab 11/11/21 ?0446  ?TRIG 207*  ?  ?Hematology ?Recent Labs  ?Lab 11/09/21 ?0452 11/10/21 ?9211 11/11/21 ?0447  ?WBC 25.9* 38.3*  39.0* 56.7*  ?RBC 2.65* 2.38*  2.37* 2.05*  ?HGB 7.8* 6.9*  7.0* 6.2*  ?HCT 26.6* 24.6*  24.4* 20.5*  ?MCV 100.4* 103.4*  103.0* 100.0  ?MCH 29.4 29.0  29.5 30.2  ?MCHC 29.3* 28.0*  28.7* 30.2  ?RDW 14.7 15.0  14.9 15.1  ?PLT 308 355  347 336  ? ?Thyroid No results for input(s): TSH, FREET4 in the last 168 hours.  ?BNP ?Recent Labs  ?Lab 11/10/21 ?0953  ?BNP 1,203.4*  ?  ?DDimer No results for input(s): DDIMER in the last 168 hours.  ? ?Radiology  ?  ?DG Abd 1 View ? ?Result Date: 11/10/2021 ?CLINICAL DATA:  Abdominal distention. EXAM: ABDOMEN - 1 VIEW COMPARISON:  None Available. FINDINGS: The bowel gas pattern is nonobstructive with mild-to-moderate stool retention large intestine. NGT is in place with the tip projected at the body of the stomach angled slightly left. No radio-opaque calculi or other significant radiographic abnormality are seen, but there is limited image detail in the abdomen due to the patient not being able to lie flat. IMPRESSION: 1. NGT tip in the body of stomach. 2. Constipation with no acute radiographic findings.  Limited exam. Electronically Signed   By: Telford Nab M.D.   On: 11/10/2021 05:32  ? ?DG Chest Port 1 View ? ?Result Date: 11/11/2021 ?CLINICAL DATA:  Former smoker respiratory failure EXAM: PORTABLE CHEST 1 VIEW COMPARISON:  Chest x-ray dated Nov 09, 2021 FINDINGS: Cardiac and mediastinal contours are unchanged. ETT projects over the midthoracic trachea. Left IJ line is at the junction of the brachiocephalic and SVC. Enteric tube partially seen coursing below the diaphragm. Bilateral heterogeneous pulmonary opacities, appear worsened increased in the left  hemithorax. Small bilateral pleural effusions. No evidence of pneumothorax. IMPRESSION: 1. Stable support devices. 2. Bilateral heterogeneous pulmonary opacities, appear worsened increased in the left hemithorax. 3. Small bilateral pleural effusions. Electronically Signed   By: Yetta Glassman M.D.   On:  11/11/2021 08:09  ? ?DG Chest Port 1 View ? ?Result Date: 11/09/2021 ?CLINICAL DATA:  Respiratory failure.  History of CHF. EXAM: PORTABLE CHEST 1 VIEW COMPARISON:  Chest radiograph dated 11/08/2021. FINDINGS: Endotracheal tube with tip approximately 4.5 cm above the carina. Left IJ central venous line with tip over central SVC. Enteric tube extends below the diaphragm with tip over the gastric air. No significant interval change in bilateral pulmonary opacities compared to the prior radiograph. Small bilateral pleural effusions. No pneumothorax. Stable cardiac silhouette. Atherosclerotic calcification of the aorta. No acute osseous pathology. Degenerative changes spine. IMPRESSION: 1. No significant interval change in the bilateral pulmonary opacities compared to the prior radiograph. 2. Small bilateral pleural effusions. Electronically Signed   By: Anner Crete M.D.   On: 11/09/2021 19:20  ? ?ECHOCARDIOGRAM LIMITED ? ?Result Date: 11/10/2021 ?   ECHOCARDIOGRAM LIMITED REPORT   Patient Name:   SILVANA HOLECEK Date of Exam: 11/10/2021 Medical Rec #:  [id number]      Height:       67.0 in Accession #:    [id number]     Weight:       208.1 lb Date of Birth:  Nov 29, 1960     BSA:          2.057 m? Patient Age:    61 years       BP:           102/72 mmHg Patient Gender: F              HR:           103 bpm. Exam Location:  [facility] Procedure: 2D Echo, Cardiac Doppler and Color Doppler Indications:     Shock  History:         Patient has prior history of Echocardiogram examinations. CHF,                  CAD, COPD, Arrythmias:Atrial Fibrillation; Risk Factors:Former                  Smoker and Hypertension.  Sonographer:      Rosalia Hammers Referring Phys:  [id number] Freddi Starr Diagnosing Phys: Kathlyn Sacramento MD  Sonographer Comments: Suboptimal parasternal window and echo performed with patient supine and on artificial respirator. Im

## 2021-11-11 NOTE — Consult Note (Signed)
PHARMACY CONSULT NOTE ? ?Pharmacy Consult for Electrolyte Monitoring and Replacement  ? ?Recent Labs: ?Potassium (mmol/L)  ?Date Value  ?11/11/2021 5.5 (H)  ?04/05/2014 4.6  ? ?Magnesium (mg/dL)  ?Date Value  ?11/10/2021 2.8 (H)  ? ?Calcium (mg/dL)  ?Date Value  ?11/11/2021 7.2 (L)  ? ?Calcium, Total (mg/dL)  ?Date Value  ?04/05/2014 8.2 (L)  ? ?Albumin (g/dL)  ?Date Value  ?11/10/2021 2.5 (L)  ? ?Phosphorus (mg/dL)  ?Date Value  ?11/11/2021 4.3  ? ?Sodium (mmol/L)  ?Date Value  ?11/11/2021 138  ?04/05/2014 141  ? ?Assessment: ?Patient is a 61 y.o. female with medical history including HFpEF, COPD, tobacco use disorder admitted on 11/01/2021 with acute on chronic respiratory failure. Admission complicated by severe metabolic derangements requiring transfer to ICU and ultimately intubation on 5/2. Pharmacy consulted to assist with electrolyte monitoring and replacement as indicated. ? ?MIVF: NS at 75 cc/hr (started for diuretic induced contraction alkalosis) >> stopped 5/3 ?Nutrition: Tube feeds started 5/2 ? ?Goal of Therapy:  ?Electrolytes within normal limits ? ?Plan:  ?Doubling of creatinine: Scr 0.67>1.06>1.67; BUN 70>88>105; Holding next vanc dose and will assess by level. ?K+ 4.9>5.5, Phos 2.2>4.3 in response to IV NaPhos 15 mmol x1 yesterday. ?No further repletion today. ?--Follow-up electrolytes with AM labs tomorrow ? ?Virginia Norman ?11/11/2021 8:19 AM  ?

## 2021-11-11 NOTE — Progress Notes (Signed)
eLink Physician-Brief Progress Note ?Patient Name: Virginia Norman ?DOB: 03-29-1961 ?MRN: [id number] ? ? ?Date of Service ? 11/11/2021  ?HPI/Events of Note ? K+ 6.4 recently and treated via hyperkalemia treatment protocol, saturation in the 70's on FiO2 of 100 %.  ?eICU Interventions ? BMP ordered to follow up K+, Dr Vaughan Browner will address oxygenation issues / goals of care.  ? ? ? ?  ? ?[name] ?11/11/2021, 9:59 PM ?

## 2021-11-11 NOTE — Progress Notes (Signed)
Dr. Francine Graven notified patients needs for pressors- increasing, abdomen distended, urine output minimal. Patient is starting to weep and she is developing blisters to the back of her legs. ET suctioning pink tinged blood. No orders to repeat potassium after Lokelma or transfusion? MD into speak with family. Awaiting orders.  ?

## 2021-11-11 NOTE — Progress Notes (Signed)
? ?Date of Admission:  10/27/2021    ? ? ?ID: [name] is a 61 y.o. female  ?Principal Problem: ?  Acute respiratory failure with hypoxia and hypercapnia (HCC) ?Active Problems: ?  Benign essential HTN ?  COPD exacerbation ([place]) ?  Chronic diastolic CHF (congestive heart failure) ([place]) ?  Paroxysmal atrial fibrillation (HCC) ? ? ? ?Subjective: ?Patient is not doing well ?Remains intubated ?Fevers persist  ?has not had a bowel movement in many days. ? ? ?Medications:  ? sodium chloride   Intravenous Once  ? artificial tears  1 application. Both Eyes Q8H  ? chlorhexidine gluconate (MEDLINE KIT)  15 mL Mouth Rinse BID  ? Chlorhexidine Gluconate Cloth  6 each Topical Daily  ? docusate  100 mg Per Tube BID  ? free water  30 mL Per Tube Q4H  ? furosemide  40 mg Intravenous Q12H  ? insulin aspart  0-20 Units Subcutaneous Q4H  ? ipratropium-albuterol  3 mL Nebulization Q6H  ? mouth rinse  15 mL Mouth Rinse 10 times per day  ? [START ON 11/13/2021] methylPREDNISolone (SOLU-MEDROL) injection  20 mg Intravenous Daily  ? methylPREDNISolone (SOLU-MEDROL) injection  40 mg Intravenous Daily  ? pantoprazole sodium  40 mg Per Tube Daily  ? polyethylene glycol  17 g Oral BID  ? senna  2 tablet Per Tube BID  ? sodium chloride flush  10-40 mL Intracatheter Q12H  ? sorbitol, milk of mag, mineral oil, glycerin (SMOG) enema  300 mL Rectal Once  ? vancomycin variable dose per unstable renal function (pharmacist dosing)   Does not apply See admin instructions  ? ? ?Objective: ?Vital signs in last 24 hours: ?Temp:  [100.6 ?F (38.1 ?C)-102.7 ?F (39.3 ?C)] 100.9 ?F (38.3 ?C) (05/09 1331) ?Pulse Rate:  [110-141] 133 (05/09 1331) ?Resp:  [0-26] 26 (05/09 1331) ?BP: (76-127)/(57-88) 109/79 (05/09 1300) ?SpO2:  [80 %-96 %] 86 % (05/09 1331) ?Arterial Line BP: (86-155)/(47-69) 119/64 (05/09 1151) ?FiO2 (%):  [100 %] 100 % (05/09 1124) ?Weight:  [98.1 kg] 98.1 kg (05/09 0500) ? ?LDA ?Foley 11/03/2021 until 11/10/2021 ?Had to be reinserted on  11/10/2021 ?Central lines: CVC triple-lumen placed on 11/04/2021 ?Arterial line placed on 11/04/2021 left radial ?Other catheters ? ?PHYSICAL EXAM:  ?General: Intubated, sedated, 3 pressors ?Lungs: Bilateral air entry crepitations both sides right more than left ?Heart: Tachycardia. ?Abdomen: Soft, non-tender,not distended. Bowel sounds normal. No masses ?Extremities: No edema ?Skin: No rashes or lesions. Or bruising ?Lymph: Cervical, supraclavicular normal. ?Neurologic: Cannot be assessed ? ?Lab Results ?Recent Labs  ?  11/10/21 ?1610 11/10/21 ?1630 11/11/21 ?0447  ?WBC 38.3*  39.0*  --  56.7*  ?HGB 6.9*  7.0*  --  6.2*  ?HCT 24.6*  24.4*  --  20.5*  ?NA 143 140 138  ?K 4.9 5.3* 5.5*  ?CL 97* 94* 93*  ?CO2 41* 42* 38*  ?BUN 70* 88* 105*  ?CREATININE 0.67 1.06* 1.67*  ? ?Liver Panel ?Recent Labs  ?  11/09/21 ?0452 11/10/21 ?9604  ?PROT 5.2* 4.8*  ?ALBUMIN 2.6* 2.5*  ?AST 47* 58*  ?ALT 61* 70*  ?ALKPHOS 41 35*  ?BILITOT 0.7 0.9  ? ? ?Microbiology: ?Respiratory panel PCR showed on 10/11/2021 parainfluenza virus 3 ?BAL culture on 11/07/2021 no growth ? ?ABG: PCO2 104, pH 7.24, P O2 63, HCO3 44.6 ? ?Studies/Results: ?DG Abd 1 View ? ?Result Date: 11/10/2021 ?CLINICAL DATA:  Abdominal distention. EXAM: ABDOMEN - 1 VIEW COMPARISON:  None Available. FINDINGS: The bowel gas pattern is  nonobstructive with mild-to-moderate stool retention large intestine. NGT is in place with the tip projected at the body of the stomach angled slightly left. No radio-opaque calculi or other significant radiographic abnormality are seen, but there is limited image detail in the abdomen due to the patient not being able to lie flat. IMPRESSION: 1. NGT tip in the body of stomach. 2. Constipation with no acute radiographic findings.  Limited exam. Electronically Signed   By: Telford Nab M.D.   On: 11/10/2021 05:32  ? ?DG Chest Port 1 View ? ?Result Date: 11/11/2021 ?CLINICAL DATA:  Former smoker respiratory failure EXAM: PORTABLE CHEST 1 VIEW  COMPARISON:  Chest x-ray dated Nov 09, 2021 FINDINGS: Cardiac and mediastinal contours are unchanged. ETT projects over the midthoracic trachea. Left IJ line is at the junction of the brachiocephalic and SVC. Enteric tube partially seen coursing below the diaphragm. Bilateral heterogeneous pulmonary opacities, appear worsened increased in the left hemithorax. Small bilateral pleural effusions. No evidence of pneumothorax. IMPRESSION: 1. Stable support devices. 2. Bilateral heterogeneous pulmonary opacities, appear worsened increased in the left hemithorax. 3. Small bilateral pleural effusions. Electronically Signed   By: Yetta Glassman M.D.   On: 11/11/2021 08:09  ? ?DG Chest Port 1 View ? ?Result Date: 11/09/2021 ?CLINICAL DATA:  Respiratory failure.  History of CHF. EXAM: PORTABLE CHEST 1 VIEW COMPARISON:  Chest radiograph dated 11/08/2021. FINDINGS: Endotracheal tube with tip approximately 4.5 cm above the carina. Left IJ central venous line with tip over central SVC. Enteric tube extends below the diaphragm with tip over the gastric air. No significant interval change in bilateral pulmonary opacities compared to the prior radiograph. Small bilateral pleural effusions. No pneumothorax. Stable cardiac silhouette. Atherosclerotic calcification of the aorta. No acute osseous pathology. Degenerative changes spine. IMPRESSION: 1. No significant interval change in the bilateral pulmonary opacities compared to the prior radiograph. 2. Small bilateral pleural effusions. Electronically Signed   By: Anner Crete M.D.   On: 11/09/2021 19:20  ? ?ECHOCARDIOGRAM LIMITED ? ?Result Date: 11/10/2021 ?   ECHOCARDIOGRAM LIMITED REPORT   Patient Name:   Virginia Norman Date of Exam: 11/10/2021 Medical Rec #:  [id number]      Height:       67.0 in Accession #:    [id number]     Weight:       208.1 lb Date of Birth:  02-Apr-1961     BSA:          2.057 m? Patient Age:    59 years       BP:           102/72 mmHg Patient Gender: F               HR:           103 bpm. Exam Location:  [facility] Procedure: 2D Echo, Cardiac Doppler and Color Doppler Indications:     Shock  History:         Patient has prior history of Echocardiogram examinations. CHF,                  CAD, COPD, Arrythmias:Atrial Fibrillation; Risk Factors:Former                  Smoker and Hypertension.  Sonographer:     Rosalia Hammers Referring Phys:  [id number] Freddi Starr Diagnosing Phys: Kathlyn Sacramento MD  Sonographer Comments: Suboptimal parasternal window and echo performed with patient supine and on artificial respirator. Image acquisition challenging due to  patient body habitus and Image acquisition challenging due to COPD. IMPRESSIONS  1. Left ventricular ejection fraction, by estimation, is 55 to 60%. The left ventricle has normal function. Left ventricular endocardial border not optimally defined to evaluate regional wall motion. Left ventricular diastolic parameters are indeterminate.  2. Right ventricular systolic function is normal. The right ventricular size is normal. There is severely elevated pulmonary artery systolic pressure. The estimated right ventricular systolic pressure is 00.8 mmHg.  3. Moderate pleural effusion in the left lateral region.  4. The mitral valve is normal in structure. No evidence of mitral valve regurgitation. No evidence of mitral stenosis.  5. The aortic valve is normal in structure. Aortic valve regurgitation is not visualized. No aortic stenosis is present.  6. The inferior vena cava is normal in size with <50% respiratory variability, suggesting right atrial pressure of 8 mmHg.  7. Challenging image quality. FINDINGS  Left Ventricle: Left ventricular ejection fraction, by estimation, is 55 to 60%. The left ventricle has normal function. Left ventricular endocardial border not optimally defined to evaluate regional wall motion. The left ventricular internal cavity size was normal in size. There is no left ventricular hypertrophy. Left ventricular  diastolic parameters are indeterminate. Right Ventricle: The right ventricular size is normal. No increase in right ventricular wall thickness. Right ventricular systolic function is normal. There is severely elevated

## 2021-11-11 NOTE — Consult Note (Signed)
ANTICOAGULATION CONSULT NOTE - Consult ? ?Pharmacy Consult for Eliquis > Heparin gtt conversion ?Indication: atrial fibrillation ? ?No Known Allergies ? ?Patient Measurements: ?Height: 5\' 7"  (170.2 cm) ?Weight: 94.4 kg (208 lb 1.8 oz) ?IBW/kg (Calculated) : 61.6 ?Heparin Dosing Weight: 82.2kg ? ?Vital Signs: ?Temp: 101.7 ?F (38.7 ?C) (05/09 0000) ?Temp Source: Esophageal (05/08 2036) ?BP: 112/67 (05/09 0000) ?Pulse Rate: 111 (05/09 0000) ? ?Labs: ?Recent Labs  ?  11/08/21 ?01/08/22 11/09/21 ?01/09/22 11/10/21 ?01/10/22 11/10/21 ?1630 11/10/21 ?1755 11/10/21 ?2013 11/10/21 ?2343  ?HGB 9.0* 7.8* 6.9*  7.0*  --   --   --   --   ?HCT 30.1* 26.6* 24.6*  24.4*  --   --   --   --   ?PLT 283 308 355  347  --   --   --   --   ?APTT  --   --   --   --  29  --  63*  ?LABPROT  --   --   --   --  19.0*  --   --   ?INR  --   --   --   --  1.6*  --   --   ?HEPARINUNFRC  --   --   --   --   --   --  >1.10*  ?CREATININE 0.59 0.56 0.67 1.06*  --   --   --   ?TROPONINIHS 138*  --   --   --  237* 280*  --   ? ? ? ?Estimated Creatinine Clearance: 64.9 mL/min (A) (by C-G formula based on SCr of 1.06 mg/dL (H)). ? ? ?Medications:  ?NKDA; Heparin Dosing Weight: 82.2kg ?PTA: None ?Inpatient: Eliquis 5mg  BID (4/28-5/08 0900) > Heparin gtt ? ?Assessment: ?60yo F w/ h/o HFpEF, COPD, and HTN admitted with acute on chronic hypoxic and hypercapnic respiratory failure secondary to COPDE and parainfluenza who is being evaluated for Afib with RVR. Pharmacy has been consulted for conversion of Eliquis started this admission to heparin gtt. ? ?5/08 - PCCM: "Patient with left pleural effusion. Will hold eliquis and start heparin drip in anticipation of thoracentesis to drain left pleural effusion" ? ?Date Time aPTT/HL Rate/Comment ?     ? ?Baseline Labs: ?aPTT - pending ?INR - pending ?Hgb - 16.1>13.6>10.9>10.2 ?Plts - 279>300>208>252 ? ?Goal of Therapy:  ?Heparin level 0.3-0.7 units/ml ?aPTT 66-102 seconds ?Monitor platelets by anticoagulation protocol: Yes ?   ?Plan:  ?Pt's last dose of Eliquis 5mg  was 5/08 0900; CrCl ~54ml/min. Will reduce initial bolus given recent DOAC & indicated for Afib. Anticipate DOAC lab interference, check HL with first interval aPTT. ? ?5/8 @ 2343:  aPTT = 63 (subtherapeutic), HL = > 1.10 (elevated) ?Will order heparin 1200 units IV X 1 and increase drip rate to 1400 units/hr.  ?HL and aPTT do not correlate so will continue to use aPTT to dose heparin. ?Will recheck HL and aPTT 6 hrs after rate change.  ? ? ?Olanda Boughner D ?11/11/2021,1:01 AM ? ? ?

## 2021-11-11 NOTE — Progress Notes (Signed)
PCCM note ? ?Hb 6.2. No evidence of active bleed. ?1 unit PRBC ordered ? ?Marshell Garfinkel MD ?[facility] ?11/11/2021, 6:07 AM  ? ? ?

## 2021-11-11 NOTE — Consult Note (Signed)
Consultation Note Date: 11/11/2021   Patient Name: Virginia Norman  DOB: 12/16/1960  MRN: [id number]  Age / Sex: 62 y.o., female  [facility]P: [facility] [place] Referring Physician: Freddi Starr, MD  Reason for Consultation: Establishing goals of care  HPI/Patient Profile: Is a 61 year old woman with a medical history of CHF with preserved ejection fraction, COPD and ongoing tobacco abuse, who was admitted on 29 October 2021 with acute on chronic respiratory failure with hypoxia and hypercarbia.  She has been managed in the progressive care unit.  She presented with a working diagnosis of decompensation CHF.  She received diuretics until 4/30.  She was transferred to the intensive care unit due to need for noninvasive ventilation and encephalopathy likely due to her respiratory failure.  She was noted to have severe metabolic derangements to include very low chloride, very high bicarbonate and high PaCO2.  She has significant hemoconcentration on CBC.  We are asked to assist in her management.    Clinical Assessment and Goals of Care: Notes and labs reviewed. Patient is on ventilator. Met with her husband and 3 children. They have been well updated. They understand she is critical and could die at any point.   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   We discussed her diagnoses, prognosis, GOC, EOL wishes disposition and options.  Created space and opportunity for patient  to explore thoughts and feelings regarding current medical information.   A detailed discussion was had today regarding advanced directives.  Concepts specific to code status, artifical feeding and hydration, IV antibiotics and rehospitalization were discussed.  The difference between an aggressive medical intervention path and a comfort care path was discussed.  Values and goals  of care important to patient and family were attempted to be elicited.  Discussed limitations of medical interventions to prolong quality of life in some situations and discussed the concept of human mortality.  They state they would like everything done at this time. They state if her heart stops they would not want resuscitation.      SUMMARY OF RECOMMENDATIONS   I will f/u Thursday on my return.       Primary Diagnoses: Present on Admission:  Acute respiratory failure with hypoxia and hypercapnia (HCC)  COPD exacerbation (HCC)  Benign essential HTN   I have reviewed the medical record, interviewed the patient and family, and examined the patient. The following aspects are pertinent.  Past Medical History:  Diagnosis Date   CHF (congestive heart failure) (HCC)    COPD (chronic obstructive pulmonary disease) (HCC)    Coronary artery disease    Hypertension    Social History   Socioeconomic History   Marital status: Single    Spouse name: Not on file   Number of children: Not on file   Years of education: Not on file   Highest education level: Not on file  Occupational History   Not on file  Tobacco Use  Smoking status: Former    Packs/day: 0.75    Years: 44.00    Pack years: 33.00    Types: Cigarettes   Smokeless tobacco: Never  Vaping Use   Vaping Use: Never used  Substance and Sexual Activity   Alcohol use: Never   Drug use: Never   Sexual activity: Not on file  Other Topics Concern   Not on file  Social History Narrative   Not on file   Social Determinants of Health   Financial Resource Strain: Not on file  Food Insecurity: Not on file  Transportation Needs: Not on file  Physical Activity: Not on file  Stress: Not on file  Social Connections: Not on file   Family History  Problem Relation Age of Onset   Arrhythmia Mother    Hypertension Mother    Heart disease Mother    Stomach cancer Father    Lupus Sister    Scheduled Meds:  sodium  chloride   Intravenous Once   artificial tears  1 application. Both Eyes Q8H   chlorhexidine gluconate (MEDLINE KIT)  15 mL Mouth Rinse BID   Chlorhexidine Gluconate Cloth  6 each Topical Daily   docusate  100 mg Per Tube BID   free water  30 mL Per Tube Q4H   furosemide  40 mg Intravenous Q12H   insulin aspart  0-20 Units Subcutaneous Q4H   ipratropium-albuterol  3 mL Nebulization Q6H   mouth rinse  15 mL Mouth Rinse 10 times per day   [START ON 11/13/2021] methylPREDNISolone (SOLU-MEDROL) injection  20 mg Intravenous Daily   methylPREDNISolone (SOLU-MEDROL) injection  40 mg Intravenous Daily   pantoprazole sodium  40 mg Per Tube Daily   polyethylene glycol  17 g Oral BID   senna  2 tablet Per Tube BID   sodium chloride flush  10-40 mL Intracatheter Q12H   vancomycin variable dose per unstable renal function (pharmacist dosing)   Does not apply See admin instructions   Continuous Infusions:  doxycycline (VIBRAMYCIN) IV Stopped (11/11/21 1129)   feeding supplement (VITAL AF 1.2 CAL) 1,000 mL (11/11/21 1500)   fentaNYL infusion INTRAVENOUS 125 mcg/hr (11/11/21 1552)   heparin 1,300 Units/hr (11/11/21 1552)   meropenem (MERREM) IV     norepinephrine (LEVOPHED) Adult infusion 25 mcg/min (11/11/21 1552)   phenylephrine (NEO-SYNEPHRINE) Adult infusion 260 mcg/min (11/11/21 1552)   propofol (DIPRIVAN) infusion 35 mcg/kg/min (11/11/21 1552)   vasopressin 0.03 Units/min (11/11/21 1552)   vecuronium (NORCURON) infusion 140m/100mL (1 mg/mL) 1 mcg/kg/min (11/11/21 1552)   PRN Meds:.acetaminophen **OR** acetaminophen, bisacodyl, fentaNYL, magnesium hydroxide, metoprolol tartrate, ondansetron **OR** ondansetron (ZOFRAN) IV, sodium chloride flush Medications Prior to Admission:  Prior to Admission medications   Medication Sig Start Date End Date Taking? Authorizing Provider  albuterol (VENTOLIN HFA) 108 (90 Base) MCG/ACT inhaler Inhale 1-2 puffs into the lungs every 6 (six) hours as needed for  wheezing or shortness of breath. 09/01/21  Yes HDarylene PriceA, FNP  aspirin EC 81 MG tablet Take 81 mg by mouth daily.   Yes [provider]  empagliflozin (JARDIANCE) 10 MG TABS tablet Take 1 tablet (10 mg total) by mouth daily before breakfast. 07/30/21  Yes Hackney, Tina A, FNP  mometasone-formoterol (DULERA) 100-5 MCG/ACT AERO Inhale 2 puffs into the lungs in the morning and at bedtime. 07/22/21  Yes CDessa Phi DO  Multiple Vitamins-Minerals (MULTIVITAMIN WITH MINERALS) tablet Take 1 tablet by mouth daily.   Yes [provider]  potassium chloride SA (KLOR-CON M)  20 MEQ tablet Take 1 tablet (20 mEq total) by mouth daily. 09/02/21  Yes Darylene Price A, FNP  tiotropium (SPIRIVA) 18 MCG inhalation capsule Place 18 mcg into inhaler and inhale daily.   Yes [provider]  hydrochlorothiazide (HYDRODIURIL) 12.5 MG tablet Take 1 tablet (12.5 mg total) by mouth daily. Patient not taking: Reported on 10/29/2021 07/22/21   Dessa Phi, DO  hydrochlorothiazide (MICROZIDE) 12.5 MG capsule Take 12.5 mg by mouth daily. Patient not taking: Reported on 10/29/2021 07/22/21   [provider]   No Known Allergies Review of Systems  Unable to perform ROS  Physical Exam Constitutional:      Comments: Eyes closed.   Pulmonary:     Comments: On ventilator.    Vital Signs: BP 105/68   Pulse (!) 152   Temp (!) 102.9 F (39.4 C)   Resp (!) 26   Ht 5' 7"  (1.702 m)   Wt 98.1 kg   SpO2 (!) 84%   BMI 33.87 kg/m  Pain Scale: CPOT   Pain Score: 0-No pain   SpO2: SpO2: (!) 84 % O2 Device:SpO2: (!) 84 % O2 Flow Rate: .O2 Flow Rate (L/min): 8 L/min  IO: Intake/output summary:  Intake/Output Summary (Last 24 hours) at 11/11/2021 1636 Last data filed at 11/11/2021 1552 Gross per 24 hour  Intake 4710 ml  Output 985 ml  Net 3725 ml    LBM: Last BM Date : 10/30/21 Baseline Weight: Weight: 81.2 kg Most recent weight: Weight: 98.1 kg       Signed by: Asencion Gowda, NP   Please contact Palliative Medicine Team phone at [phone] for questions and concerns.  For individual provider: See Shea Evans

## 2021-11-11 NOTE — Progress Notes (Signed)
Secured message Dr. Erin Fulling- patients hemoglobin after 1 unit 6.7. Temp 103.6 after tylenol given and decreased urine output. Awaiting MD response.  ?

## 2021-11-11 NOTE — Progress Notes (Signed)
?  11/11/21 1300  ?Clinical Encounter Type  ?Visited With Patient and family together  ?Visit Type Initial  ?Referral From Nurse  ?Consult/Referral To Chaplain  ? ?Chaplain responded to nurse page. Chaplain met with husband and grandson. Chaplain provided compassionate presence and reflective listening as husband spoke about wife's situation. He said, "I know it's time to let her go." As Chaplain was visiting more family members came to room including daughter and extended relatives. Chaplain prayed with the family and offered support.  ?

## 2021-11-11 NOTE — Progress Notes (Signed)
Pharmacy Antibiotic Note ? ?Virginia Norman is a 61 y.o. female admitted on Nov 11, 2021 with pneumonia.  Pharmacy has been consulted for vancomycin and meropenem dosing. Patient with increasing need for vasopressors.  Respiratory virus panel on 4/26 detected parainfluenza 3 virus.  Tracheal aspirate from 5/2 with possible mold (suspected contaminant) sent for identification.  Bronch performed 5/5 but culture revealed normal flora. ID consulted 5/8 ? ?Today, 11/11/2021 ?Day 8 antibiotics, Day 1 vancomycin/meropenem/doxycyline ?WBC 56.7- trending up ?Renal: SCr > doubled in 24h ?PCT 0.19 to 0.87 ?+ fevers ?Remains vent-dependent ?Norepinephrine added to phenylephrine and vasopressin ?Beta-D-glucan pending ?Amiodarone gtt stopped ?Plan: ?Stop maintenance dose vancomycin with significant rise in SCr in 24h.  Check random vancomycin level 5/10 with am labs.  Last dose given at 05:18 ?Adjust Meropenem 1gm IV q12h for worsening renal function ?Follow renal function closely and urine output. ?Follow cultures ?Check vancomycin levels if vancomycin continued > 4-5 days or sooner per renal function ? ?Height: 5\' 7"  (170.2 cm) ?Weight: 98.1 kg (216 lb 4.3 oz) ?IBW/kg (Calculated) : 61.6 ? ?Temp (24hrs), Avg:102 ?F (38.9 ?C), Min:100.6 ?F (38.1 ?C), Max:102.7 ?F (39.3 ?C) ? ?Recent Labs  ?Lab 11/07/21 ?01/07/22 11/08/21 ?01/08/22 11/09/21 ?01/09/22 11/10/21 ?01/10/22 11/10/21 ?1630 11/11/21 ?0447  ?WBC 16.6* 21.7* 25.9* 38.3*  39.0*  --  56.7*  ?CREATININE 0.76 0.59 0.56 0.67 1.06* 1.67*  ?LATICACIDVEN  --  1.2  --   --   --   --   ? ?  ?Estimated Creatinine Clearance: 42 mL/min (A) (by C-G formula based on SCr of 1.67 mg/dL (H)).   ? ?No Known Allergies ? ?Antimicrobials this admission: ?5/2 Vanc + Cefepime >> 5/4 ?5/4 Unasyn >> 5/8 ?5/8 Anidulafungin x 1 ?5/8 Doxycycline >> ?5/8 vanco >> ?5/8 meropenem >> ? ?Dose adjustments this admission: ? ? ?Microbiology results: ?5/8 blood cx: ?5/5 respiratory cx: Normal flora ?5/2 Resp cx: rare mold ?4/30  PCR: negative ? ?Thank you for allowing pharmacy to be a part of this patient?s care. ? ?5/30, PharmD, BCPS, BCIDP ?Work Cell: [phone] ?11/11/2021 7:58 AM ? ? ? ?

## 2021-11-11 NOTE — Progress Notes (Signed)
During rounds discussed patients tube feeds and being on vecuronium. Per Dr. Francine Graven continue to tube feeds. Continue to assess.  ?

## 2021-11-11 NOTE — Progress Notes (Signed)
? ?NAME:  Virginia Norman, MRN:  [id number], DOB:  03/26/1961, LOS: 14 ?ADMISSION DATE:  10/31/2021, CONSULTATION DATE:  11/03/21 ?REFERRING MD:  Cipriano Bunker, MD CHIEF COMPLAINT:  Respiratory Failure  ? ?History of Present Illness:  ?Is a 61 year old woman with a medical history of CHF with preserved ejection fraction, COPD and ongoing tobacco abuse, who was admitted on 29 October 2021 with acute on chronic respiratory failure with hypoxia and hypercarbia.  She has been managed in the progressive care unit.  She presented with a working diagnosis of decompensation CHF.  She received diuretics until 4/30.  She was transferred to the intensive care unit due to need for noninvasive ventilation and encephalopathy likely due to her respiratory failure.  She was noted to have severe metabolic derangements to include very low chloride, very high bicarbonate and high PaCO2.  She has significant hemoconcentration on CBC.  We are asked to assist in her management. ? ?Pertinent  Medical History  ?COPD ?CHF with preserved EF ?CAD ?HTN ? ?Significant Hospital Events: ?Including procedures, antibiotic start and stop dates in addition to other pertinent events   ?11/02/2021 transferred to ICU due to severe hypercapnia and severe metabolic derangements ?11/03/2021 requiring AVAPS, stopped all diuretics, Diamox, sodium chloride infusion ?11/03/2021 patient required intubation in the evening CT head negative for acute CNS process ?11/04/2021 on mechanical ventilation ?5/3 remains on vent plan for weaning trial ?5/4 remains on vent severe COPD ?5/5 failure to wean from vent, failed SAT/SBT ?5/5 s/p bronch bloody mucus plugs aspirated-cultures sent ?5/6 severe Hypoxia unable to wean, husband updated ?5/7 severe Hypoxia ?5/8 paralytic drip, ARDS worsening ? ?Interim History / Subjective:  ? ?Patient hemoglobin drop to 6.2 g/dL overnight, ordered for 1 unit of PRBCs ?Remains paralyzed ?Code status changed to DNR by family overnight ? ?Objective    ?Blood pressure (!) 102/57, pulse (!) 123, temperature (!) 102.2 ?F (39 ?C), resp. rate (!) 26, height 5\' 7"  (1.702 m), weight 98.1 kg, SpO2 95 %. ?CVP:  [10 mmHg-21 mmHg] 15 mmHg  ?Vent Mode: PRVC ?FiO2 (%):  [85 %-100 %] 100 % ?Set Rate:  [20 bmp-26 bmp] 26 bmp ?Vt Set:  [420 mL-500 mL] 420 mL ?PEEP:  [8 cmH20-10 cmH20] 10 cmH20 ?Plateau Pressure:  [30 cmH20] 30 cmH20  ? ?Intake/Output Summary (Last 24 hours) at 11/11/2021 0752 ?Last data filed at 11/11/2021 01/11/2022 ?Gross per 24 hour  ?Intake 1649 ml  ?Output 1370 ml  ?Net 279 ml  ? ?Filed Weights  ? 11/09/21 0500 11/10/21 0500 11/11/21 0500  ?Weight: 93.3 kg 94.4 kg 98.1 kg  ? ?Examination: ?General: critically ill woman, intubated, sedated ?HENT: [place]/AT, moist mucous membranes, ET tube in place ?Lungs: course diminished breath sounds, no wheezing.  ?Cardiovascular: tachycardic, irregularly irregular, no murmurs ?Abdomen: soft, non-distended, BS+ ?Extremities: cool to touch, trace edema ?Neuro: sedated, PERRL ?GU: no foley ? ?Resolved Hospital Problem list   ? ? ?Assessment & Plan:  ? ?Acute Hypoxemic and Hypercapnic respiratory Failure ?ARDS, P/F ratio 104 5/8 ?Parainfluenza 3 Viral Pneumonia ?Possible Fungal Pneumonia, Mold on respiratory culture 5/2 ?Pleural effusion, left ?Hx of COPD with Exacerbation ?- continue Mechanical Ventilator support ?- Wean Fio2 and PEEP as tolerated ?- VAP/VENT bundle implementation ?- Wean PEEP & FiO2 as tolerated, maintain SpO2 > 88% ?- Head of bed elevated 30 degrees, VAP protocol in place ?- Plateau pressures less than 30 cm H20  ?- Ensure adequate pulmonary hygiene  ?- Propofol + fentanyl for sedation ?- Lasix BID per cardiology. Limited  echo concerning for elevated pulmonary artery pressures ?- Taper solumedrol over next few days ?- Continue doxycycline for atypical pneumonia coverage and micafungin for fungal coverage.  ?- Stop amiodarone incase it is contributing to lung toxicity ? ?Multifactorial Shock ?Atrial Fibrillation w/  RVR ?Concern for septic shock due to pneumonia with possible cardiogenic component ?- Continue levophed and vasopressin ?- Check BNP ?- Cardiology following ?- amiodarone stopped due to concern for lung toxicity ?- Will consider digoxin dosing or discuss cardioversion with Cardiology ? ?Acute Metabolic Encephalopathy ?In setting of sepsis and respiratory failure ?- continue sedation for above ?- Rass goal -2 to -3 ?- Will obtain head imaging if safe to travel ? ?Acute Kidney Injury ?In setting of septic shock and ARDS ?- continue to monitor renal function ?- renal dosing of meds if needed ? ?Nutrition ?- Continue tube feeds per dietician team ? ?Overall very poor prognosis with progressive ARDS and multiorgan failure. Palliative care has been consulted.  ? ?Best Practice (right click and "Reselect all SmartList Selections" daily)  ? ?Diet/type: tubefeeds ?DVT prophylaxis: DOAC ?GI prophylaxis: PPI ?Lines: Central line, Arterial Line, and yes and it is still needed ?Foley:  N/A ?Code Status:  full code ?Last date of multidisciplinary goals of care discussion [5/8, updated patient's husband Fayrene FearingJames at the bedside] ? ?Labs   ?CBC: ?Recent Labs  ?Lab 11/07/21 ?[id number] 11/08/21 ?[id number] 11/09/21 ?[id number] 11/10/21 ?[id number] 11/11/21 ?0447  ?WBC 16.6* 21.7* 25.9* 38.3*  39.0* 56.7*  ?NEUTROABS  --   --   --  28.0*  --   ?HGB 10.2* 9.0* 7.8* 6.9*  7.0* 6.2*  ?HCT 33.2* 30.1* 26.6* 24.6*  24.4* 20.5*  ?MCV 94.6 95.9 100.4* 103.4*  103.0* 100.0  ?PLT 252 283 308 355  347 336  ? ? ?Basic Metabolic Panel: ?Recent Labs  ?Lab 11/05/21 ?0635 11/06/21 ?[id number] 11/07/21 ?[id number] 11/08/21 ?[id number] 11/08/21 ?2132 11/09/21 ?[id number] 11/10/21 ?[id number] 11/10/21 ?1630 11/11/21 ?0447  ?NA 134* 135 136 138  --  143 143 140 138  ?K 4.1 4.4 4.7 4.6  --  4.7 4.9 5.3* 5.5*  ?CL 91* 93* 91* 93*  --  96* 97* 94* 93*  ?CO2 38* 39* 37* 41*  --  41* 41* 42* 38*  ?GLUCOSE 169* 144* 202* 194*  --  183* 168* 215* 143*  ?BUN 38* 34* 52* 61*  --  58* 70* 88* 105*  ?CREATININE  0.59 0.60 0.76 0.59  --  0.56 0.67 1.06* 1.67*  ?CALCIUM 7.7* 7.2* 7.9* 7.9*  --  7.4* 7.3* 7.4* 7.2*  ?MG 2.4 2.0  --   --  2.7* 2.8* 2.8*  --   --   ?PHOS 1.5* 2.8 2.7  --  1.9* 3.9 2.2*  --  4.3  ? ?GFR: ?Estimated Creatinine Clearance: 42 mL/min (A) (by C-G formula based on SCr of 1.67 mg/dL (H)). ?Recent Labs  ?Lab 11/08/21 ?[id number] 11/09/21 ?[id number] 11/10/21 ?[id number] 11/11/21 ?0447  ?PROCALCITON  --  0.18 0.19 0.87  ?WBC 21.7* 25.9* 38.3*  39.0* 56.7*  ?LATICACIDVEN 1.2  --   --   --   ? ? ?Liver Function Tests: ?Recent Labs  ?Lab 11/09/21 ?0452 11/10/21 ?[id number]  ?AST 47* 58*  ?ALT 61* 70*  ?ALKPHOS 41 35*  ?BILITOT 0.7 0.9  ?PROT 5.2* 4.8*  ?ALBUMIN 2.6* 2.5*  ? ?No results for input(s): LIPASE, AMYLASE in the last 168 hours. ?No results for input(s): AMMONIA in the last 168 hours. ? ?ABG ?   ?Component Value Date/Time  ?  PHART 7.24 (L) 11/10/2021 1830  ? PCO2ART 104 (HH) 11/10/2021 1830  ? PO2ART 63 (L) 11/10/2021 1830  ? HCO3 44.6 (H) 11/10/2021 1830  ? O2SAT 92.5 11/10/2021 1830  ?  ? ?Coagulation Profile: ?Recent Labs  ?Lab 11/10/21 ?1755  ?INR 1.6*  ? ? ?Cardiac Enzymes: ?No results for input(s): CKTOTAL, CKMB, CKMBINDEX, TROPONINI in the last 168 hours. ? ?HbA1C: ?Hgb A1c MFr Bld  ?Date/Time Value Ref Range Status  ?07/17/2021 06:23 AM 5.8 (H) 4.8 - 5.6 % Final  ?  Comment:  ?  (NOTE) ?Pre diabetes:          5.7%-6.4% ? ?Diabetes:              >6.4% ? ?Glycemic control for   <7.0% ?adults with diabetes ?  ?10/03/2019 04:45 AM 6.3 (H) 4.8 - 5.6 % Final  ?  Comment:  ?  (NOTE) ?Pre diabetes:          5.7%-6.4% ?Diabetes:              >6.4% ?Glycemic control for   <7.0% ?adults with diabetes ?  ? ? ?CBG: ?Recent Labs  ?Lab 11/10/21 ?1142 11/10/21 ?1632 11/10/21 ?1916 11/10/21 ?2345 11/11/21 ?0435  ?GLUCAP 200* 190* 190* 152* 147*  ?  ? ?Critical care time: 35 minutes  ? ?Melody Comas, MD ?[facility] ?Office: [phone] ? ? ?See Amion for personal pager ?PCCM on call pager [phone]  until 7pm. ?Please call Elink 7p-7a. [phone] ? ? ?  ?

## 2021-11-11 NOTE — Consult Note (Signed)
ANTICOAGULATION CONSULT NOTE - Consult ? ?Pharmacy Consult for Eliquis > Heparin gtt conversion ?Indication: atrial fibrillation ? ?No Known Allergies ? ?Patient Measurements: ?Height: 5\' 7"  (170.2 cm) ?Weight: 98.1 kg (216 lb 4.3 oz) ?IBW/kg (Calculated) : 61.6 ?Heparin Dosing Weight: 82.2kg ? ?Vital Signs: ?Temp: 102.2 ?F (39 ?C) (05/09 0700) ?Temp Source: Esophageal (05/08 2036) ?BP: 102/57 (05/09 0700) ?Pulse Rate: 123 (05/09 0700) ? ?Labs: ?Recent Labs  ?  11/09/21 ?0452 11/10/21 ?01/10/22 11/10/21 ?1630 11/10/21 ?1755 11/10/21 ?2013 11/10/21 ?01/10/22 11/11/21 ?01/11/22 11/11/21 ?0720  ?HGB 7.8* 6.9*  7.0*  --   --   --   --  6.2*  --   ?HCT 26.6* 24.6*  24.4*  --   --   --   --  20.5*  --   ?PLT 308 355  347  --   --   --   --  336  --   ?APTT  --   --   --  29  --  63*  --  150*  ?LABPROT  --   --   --  19.0*  --   --   --   --   ?INR  --   --   --  1.6*  --   --   --   --   ?HEPARINUNFRC  --   --   --   --   --  >1.10*  --  >1.10*  ?CREATININE 0.56 0.67 1.06*  --   --   --  1.67*  --   ?TROPONINIHS  --   --   --  237* 280*  --   --   --   ? ? ? ?Estimated Creatinine Clearance: 42 mL/min (A) (by C-G formula based on SCr of 1.67 mg/dL (H)). ? ? ?Medications:  ?NKDA; Heparin Dosing Weight: 82.2kg ?PTA: None ?Inpatient: Eliquis 5mg  BID (4/28-5/08 0900) > Heparin gtt ? ?Assessment: ?[name] w/ h/o HFpEF, COPD, and HTN admitted with acute on chronic hypoxic and hypercapnic respiratory failure secondary to COPDE and parainfluenza who is being evaluated for Afib with RVR. Pharmacy has been consulted for conversion of Eliquis started this admission to heparin gtt. ? ?5/08 - PCCM: "Patient with left pleural effusion. Will hold eliquis and start heparin drip in anticipation of thoracentesis to drain left pleural effusion" ? ?Date Time aPTT/HL Rate/Comment ?5/08  2343 63s / >1.1 Subthera aPTT; 1250 > 1400 un/hr ?5/09 0720 150s / >1.1 Supratherapeutic aPTT ? ?Baseline Labs: ?aPTT - 29s ?INR - 1.6 (on eliquis) ?Hgb -  16.1>13.6>10.9>10.2 ?Plts - 279>300>208>252 ? ?Goal of Therapy:  ?Heparin level 0.3-0.7 units/ml ?aPTT 66-102 seconds ?Monitor platelets by anticoagulation protocol: Yes ?  ?Plan:  ?DOAC lab interference still present, last aPTT subthera and dose was inc'd ~2un/k/h, but now supratherapeutic at >150s. Will hold x1 hr and reduce to 1300 un/hr based on prior response. ? ?5/09 @ 0720:  aPTT = 150s (SUPRAtherapeutic), HL = > 1.10 (elevated) ?Will hold heparin gtt x1 hr 7/09); then resume at reduced rate of 1300 units/hr.  ?HL and aPTT do not correlate so will continue to use aPTT to dose heparin. ?Will recheck HL and aPTT 6 hrs after rate change.  ? ? ?7/09 Jachin Coury ?11/11/2021,8:23 AM ? ? ?

## 2021-11-11 NOTE — Progress Notes (Signed)
Spoke with Dr. Erin Fulling regarding patients oxygen level dropping down to 81% on vent at 100%. Called husband to notify him of patients status. He states that he is on his way. Continue to assess.  ?  ?

## 2021-11-11 NOTE — Progress Notes (Addendum)
PCCM Update: ? ?I spoke with patient's husband and two daughters at the bedside. I updated them on patient's increase in vasopressor support, decline in oxygen, increasing volume overload, declining renal function and decreased urine output. I offered that we can do temporary dialysis but given the progressive lung damage prior to her renal failure that I do not believe dialysis will change the outcome of her surviving this hospitalization. I also expressed the risks of laying her flat to place a dialysis catheter. The family agreed that they do not want to move forward with dialysis due to the risks and lack of benefit. Current APACHE II score of 39, 89.8% predicted death rate. ? ?Melody Comas, MD ?[facility] ?Office: [phone] ? ? ?See Amion for personal pager ?PCCM on call pager [phone] until 7pm. ?Please call Elink 7p-7a. [phone] ? ?

## 2021-11-11 NOTE — Progress Notes (Signed)
Dr. Francine Graven notified of potassium of 6.4. MD will place orders. Continue to assess. ?

## 2021-11-12 DIAGNOSIS — E875 Hyperkalemia: Secondary | ICD-10-CM

## 2021-11-12 LAB — BLOOD CULTURE ID PANEL (REFLEXED) - BCID2

## 2021-11-12 LAB — BPAM RBC
Blood Product Expiration Date: 202306072359
Blood Product Expiration Date: 202306142359
ISSUE DATE / TIME: 202305090941
ISSUE DATE / TIME: 202305091813
Unit Type and Rh: 5100
Unit Type and Rh: 5100

## 2021-11-12 LAB — ANCA PROFILE
Anti-MPO Antibodies: <0.2 units (ref 0.0–0.9)
Anti-PR3 Antibodies: <0.2 units (ref 0.0–0.9)
Atypical P-ANCA titer: <1:20 {titer}
C-ANCA: <1:20 {titer}
P-ANCA: <1:20 {titer}

## 2021-11-12 LAB — CBC
HCT: 22.8 % — ABNORMAL LOW (ref 36.0–46.0)
HCT: 23.1 % — ABNORMAL LOW (ref 36.0–46.0)
Hemoglobin: 7 g/dL — ABNORMAL LOW (ref 12.0–15.0)
Hemoglobin: 7.2 g/dL — ABNORMAL LOW (ref 12.0–15.0)
MCH: 30.4 pg (ref 26.0–34.0)
MCH: 30.6 pg (ref 26.0–34.0)
MCHC: 30.7 g/dL (ref 30.0–36.0)
MCHC: 31.2 g/dL (ref 30.0–36.0)
MCV: 99.1 fL (ref 80.0–100.0)
MCV: 98.3 fL (ref 80.0–100.0)
Platelets: 210 10*3/uL (ref 150–400)
Platelets: 207 10*3/uL (ref 150–400)
RBC: 2.3 MIL/uL — ABNORMAL LOW (ref 3.87–5.11)
RBC: 2.35 MIL/uL — ABNORMAL LOW (ref 3.87–5.11)
RDW: 15.5 % (ref 11.5–15.5)
RDW: 15.5 % (ref 11.5–15.5)
WBC: 56.4 10*3/uL (ref 4.0–10.5)
WBC: 63 10*3/uL (ref 4.0–10.5)
nRBC: 7 % — ABNORMAL HIGH (ref 0.0–0.2)
nRBC: 6.2 % — ABNORMAL HIGH (ref 0.0–0.2)

## 2021-11-12 LAB — BASIC METABOLIC PANEL
Anion gap: 12 (ref 5–15)
Anion gap: 15 (ref 5–15)
BUN: 123 mg/dL — ABNORMAL HIGH (ref 8–23)
BUN: 126 mg/dL — ABNORMAL HIGH (ref 8–23)
CO2: 28 mmol/L (ref 22–32)
CO2: 24 mmol/L (ref 22–32)
Calcium: 6.4 mg/dL — CL (ref 8.9–10.3)
Calcium: 6.5 mg/dL — ABNORMAL LOW (ref 8.9–10.3)
Chloride: 96 mmol/L — ABNORMAL LOW (ref 98–111)
Chloride: 93 mmol/L — ABNORMAL LOW (ref 98–111)
Creatinine, Ser: 3.26 mg/dL — ABNORMAL HIGH (ref 0.44–1.00)
Creatinine, Ser: 3.53 mg/dL — ABNORMAL HIGH (ref 0.44–1.00)
GFR, Estimated: 15 mL/min — ABNORMAL LOW (ref >60)
GFR, Estimated: 14 mL/min — ABNORMAL LOW (ref >60)
Glucose, Bld: 120 mg/dL — ABNORMAL HIGH (ref 70–99)
Glucose, Bld: 106 mg/dL — ABNORMAL HIGH (ref 70–99)
Potassium: >7.5 mmol/L (ref 3.5–5.1)
Potassium: >7.5 mmol/L (ref 3.5–5.1)
Sodium: 136 mmol/L (ref 135–145)
Sodium: 132 mmol/L — ABNORMAL LOW (ref 135–145)

## 2021-11-12 LAB — TYPE AND SCREEN
ABO/RH(D): O POS
Antibody Screen: NEGATIVE
Unit division: 0
Unit division: 0

## 2021-11-12 LAB — GLUCOSE, CAPILLARY
Glucose-Capillary: 151 mg/dL — ABNORMAL HIGH (ref 70–99)
Glucose-Capillary: 143 mg/dL — ABNORMAL HIGH (ref 70–99)
Glucose-Capillary: 144 mg/dL — ABNORMAL HIGH (ref 70–99)

## 2021-11-12 LAB — APTT: aPTT: >200 seconds (ref 24–36)

## 2021-11-12 LAB — VANCOMYCIN, RANDOM: Vancomycin Rm: 20

## 2021-11-12 LAB — POTASSIUM: Potassium: >7.5 mmol/L (ref 3.5–5.1)

## 2021-11-12 MED ORDER — FENTANYL CITRATE PF 50 MCG/ML IJ SOSY: 50.0000 ug | PREFILLED_SYRINGE | INTRAMUSCULAR | Status: DC | PRN

## 2021-11-12 MED ORDER — FENTANYL 2500MCG IN NS 250ML (10MCG/ML) PREMIX INFUSION: 0.0000 ug/h | INTRAVENOUS | Status: DC

## 2021-11-12 MED ORDER — ALBUTEROL SULFATE (2.5 MG/3ML) 0.083% IN NEBU
10.0000 mg | INHALATION_SOLUTION | Freq: Once | RESPIRATORY_TRACT | Status: AC
  Administered 2021-11-12: 10 mg via RESPIRATORY_TRACT
  Filled 2021-11-12: qty 12

## 2021-11-12 MED ORDER — SODIUM ZIRCONIUM CYCLOSILICATE 5 G PO PACK
10.0000 g | PACK | Freq: Three times a day (TID) | ORAL | Status: DC
  Administered 2021-11-12: 10 g via ORAL
  Filled 2021-11-12: qty 2

## 2021-11-12 MED ORDER — GLYCOPYRROLATE 1 MG PO TABS
1.0000 mg | ORAL_TABLET | ORAL | Status: DC | PRN
  Filled 2021-11-12: qty 1

## 2021-11-12 MED ORDER — HEPARIN (PORCINE) 25000 UT/250ML-% IV SOLN: 1000.0000 [IU]/h | INTRAVENOUS | Status: DC

## 2021-11-12 MED ORDER — POLYVINYL ALCOHOL 1.4 % OP SOLN: 1.0000 [drp] | Freq: Four times a day (QID) | OPHTHALMIC | Status: DC | PRN

## 2021-11-12 MED ORDER — GLYCOPYRROLATE 0.2 MG/ML IJ SOLN: 0.2000 mg | INTRAMUSCULAR | Status: DC | PRN

## 2021-11-12 MED ORDER — DEXTROSE 50 % IV SOLN
1.0000 | Freq: Once | INTRAVENOUS | Status: AC
  Administered 2021-11-12: 50 mL via INTRAVENOUS
  Filled 2021-11-12: qty 50

## 2021-11-12 MED ORDER — DIPHENHYDRAMINE HCL 50 MG/ML IJ SOLN: 25.0000 mg | INTRAMUSCULAR | Status: DC | PRN

## 2021-11-12 MED ORDER — MIDAZOLAM-SODIUM CHLORIDE 100-0.9 MG/100ML-% IV SOLN: 0.0000 mg/h | INTRAVENOUS | Status: DC

## 2021-11-12 MED ORDER — ACETAMINOPHEN 650 MG RE SUPP: 650.0000 mg | Freq: Four times a day (QID) | RECTAL | Status: DC | PRN

## 2021-11-12 MED ORDER — INSULIN ASPART 100 UNIT/ML IV SOLN
5.0000 [IU] | Freq: Once | INTRAVENOUS | Status: AC
  Administered 2021-11-12: 5 [IU] via INTRAVENOUS
  Filled 2021-11-12: qty 0.05

## 2021-11-12 MED ORDER — FENTANYL BOLUS VIA INFUSION
100.0000 ug | INTRAVENOUS | Status: DC | PRN
  Filled 2021-11-12: qty 100

## 2021-11-12 MED ORDER — MIDAZOLAM HCL 2 MG/2ML IJ SOLN: 1.0000 mg | INTRAMUSCULAR | Status: DC | PRN

## 2021-11-12 MED ORDER — ACETAMINOPHEN 325 MG PO TABS
650.0000 mg | ORAL_TABLET | Freq: Four times a day (QID) | ORAL | Status: DC | PRN
Start: 2021-11-12 — End: 2021-11-12

## 2021-11-12 MED ORDER — MIDAZOLAM BOLUS VIA INFUSION (WITHDRAWAL LIFE SUSTAINING TX)
2.0000 mg | INTRAVENOUS | Status: DC | PRN
  Filled 2021-11-12: qty 2

## 2021-11-12 MED ORDER — DEXTROSE 5 % IV SOLN: INTRAVENOUS | Status: DC

## 2021-11-12 MED ORDER — CALCIUM GLUCONATE-NACL 1-0.675 GM/50ML-% IV SOLN
1.0000 g | Freq: Once | INTRAVENOUS | Status: AC
  Administered 2021-11-12: 1000 mg via INTRAVENOUS
  Filled 2021-11-12: qty 50

## 2021-11-13 LAB — ORGANISM IDENTIFICATION, MOLD

## 2021-11-13 LAB — MOLD ORGANISM REFLEX

## 2021-11-14 ENCOUNTER — Ambulatory Visit: Payer: Self-pay | Admitting: Family

## 2021-11-14 LAB — CULTURE, BLOOD (ROUTINE X 2): Special Requests: ADEQUATE

## 2021-11-15 LAB — CULTURE, BLOOD (ROUTINE X 2)
Culture: NO GROWTH
Special Requests: ADEQUATE

## 2021-11-15 LAB — ASPERGILLUS ANTIGEN, BAL/SERUM: Aspergillus Ag, BAL/Serum: 0.39 Index (ref 0.00–0.49)

## 2021-11-20 LAB — CULTURE, RESPIRATORY W GRAM STAIN

## 2021-11-24 LAB — FUNGUS CULTURE WITH STAIN

## 2021-11-24 LAB — FUNGUS CULTURE RESULT

## 2021-11-24 LAB — FUNGAL ORGANISM REFLEX

## 2021-12-04 NOTE — Consult Note (Signed)
PHARMACY CONSULT NOTE ? ?Pharmacy Consult for Electrolyte Monitoring and Replacement  ? ?Recent Labs: ?Potassium (mmol/L)  ?Date Value  ?11/17/2021 >7.5 (HH)  ?04/05/2014 4.6  ? ?Magnesium (mg/dL)  ?Date Value  ?11/10/2021 2.8 (H)  ? ?Calcium (mg/dL)  ?Date Value  ?11/08/2021 6.4 (LL)  ? ?Calcium, Total (mg/dL)  ?Date Value  ?04/05/2014 8.2 (L)  ? ?Albumin (g/dL)  ?Date Value  ?11/11/2021 2.4 (L)  ? ?Phosphorus (mg/dL)  ?Date Value  ?11/11/2021 4.3  ? ?Sodium (mmol/L)  ?Date Value  ?11/17/2021 136  ?04/05/2014 141  ? ?Assessment: ?Patient is a 61 y.o. female with medical history including HFpEF, COPD, tobacco use disorder admitted on 2021/11/07 with acute on chronic respiratory failure. Admission complicated by severe metabolic derangements requiring transfer to ICU and ultimately intubation on 5/2. Pharmacy consulted to assist with electrolyte monitoring and replacement as indicated. ? ?MIVF: KVO ?Nutrition: Tube feeds 89ml/h ? ?Goal of Therapy:  ?Electrolytes within normal limits ? ?Plan:  ?Doubling of creatinine: Scr 0.67>1.06>1.67>3.26; BUN 88>105>123; Holding next vanc dose and will assess by level. ?K+ 5.5>6.5>7.5, Phos 2.2>4.3. Despite receipt of Lasix 40mg  IV BID, lokelma 10g TID (SMOG enema, BM confirmed 5/10), calcium gluc, albuterol, ins/dex ?No further repletion today. ?CTM via q4H potassium checks and daily BMP in AM labs. ? ? Virginia Norman ?11/09/2021 9:02 AM  ?

## 2021-12-04 NOTE — Progress Notes (Signed)
PHARMACY - PHYSICIAN COMMUNICATION ?CRITICAL VALUE ALERT - BLOOD CULTURE IDENTIFICATION (BCID) ? ?Virginia Norman is an 61 y.o. female who presented to [facility] on 11/27/21 with a chief complaint of PNA  ? ?Assessment:  Staph Epi growing 1 of 4 bottles (anaerobic) , MecA  (include suspected source if known) ? ?Name of physician (or Provider) Contacted:  Praveen Mannam  ? ?Current antibiotics: Vanc, Doxycycline, meropenem  ? ?Changes to prescribed antibiotics recommended:  ?Will continue pt on current regimen, evening MD wants to defer to day shift to change abx.  ? ?Results for orders placed or performed during the hospital encounter of Nov 27, 2021  ?Blood Culture ID Panel (Reflexed) (Collected: 11/10/2021  3:47 PM)  ?Result Value Ref Range  ? Enterococcus faecalis NOT DETECTED NOT DETECTED  ? Enterococcus Faecium NOT DETECTED NOT DETECTED  ? Listeria monocytogenes NOT DETECTED NOT DETECTED  ? Staphylococcus species DETECTED (A) NOT DETECTED  ? Staphylococcus aureus (BCID) NOT DETECTED NOT DETECTED  ? Staphylococcus epidermidis DETECTED (A) NOT DETECTED  ? Staphylococcus lugdunensis NOT DETECTED NOT DETECTED  ? Streptococcus species NOT DETECTED NOT DETECTED  ? Streptococcus agalactiae NOT DETECTED NOT DETECTED  ? Streptococcus pneumoniae NOT DETECTED NOT DETECTED  ? Streptococcus pyogenes NOT DETECTED NOT DETECTED  ? A.calcoaceticus-baumannii NOT DETECTED NOT DETECTED  ? Bacteroides fragilis NOT DETECTED NOT DETECTED  ? Enterobacterales NOT DETECTED NOT DETECTED  ? Enterobacter cloacae complex NOT DETECTED NOT DETECTED  ? Escherichia coli NOT DETECTED NOT DETECTED  ? Klebsiella aerogenes NOT DETECTED NOT DETECTED  ? Klebsiella oxytoca NOT DETECTED NOT DETECTED  ? Klebsiella pneumoniae NOT DETECTED NOT DETECTED  ? Proteus species NOT DETECTED NOT DETECTED  ? Salmonella species NOT DETECTED NOT DETECTED  ? Serratia marcescens NOT DETECTED NOT DETECTED  ? Haemophilus influenzae NOT DETECTED NOT DETECTED  ? Neisseria  meningitidis NOT DETECTED NOT DETECTED  ? Pseudomonas aeruginosa NOT DETECTED NOT DETECTED  ? Stenotrophomonas maltophilia NOT DETECTED NOT DETECTED  ? Candida albicans NOT DETECTED NOT DETECTED  ? Candida auris NOT DETECTED NOT DETECTED  ? Candida glabrata NOT DETECTED NOT DETECTED  ? Candida krusei NOT DETECTED NOT DETECTED  ? Candida parapsilosis NOT DETECTED NOT DETECTED  ? Candida tropicalis NOT DETECTED NOT DETECTED  ? Cryptococcus neoformans/gattii NOT DETECTED NOT DETECTED  ? Methicillin resistance mecA/C DETECTED (A) NOT DETECTED  ? ? ?[name] D ?11/23/2021  6:22 AM ? ?

## 2021-12-04 NOTE — Consult Note (Signed)
ANTICOAGULATION CONSULT NOTE - Consult ? ?Pharmacy Consult for Eliquis > Heparin gtt conversion ?Indication: atrial fibrillation ? ?No Known Allergies ? ?Patient Measurements: ?Height: 5' 7.01" (170.2 cm) ?Weight: 98.1 kg (216 lb 4.3 oz) ?IBW/kg (Calculated) : 61.62 ?Heparin Dosing Weight: 82.2kg ? ?Vital Signs: ?Temp: 101.8 ?F (38.8 ?C) (05/10 0145) ?Temp Source: Esophageal (05/10 0000) ?BP: 71/13 (05/10 0145) ?Pulse Rate: 114 (05/10 0145) ? ?Labs: ?Recent Labs  ?  11/10/21 ?3976 11/10/21 ?1630 11/10/21 ?1755 11/10/21 ?2013 11/10/21 ?2343 11/11/21 ?0447 11/11/21 ?0720 11/11/21 ?1348 11/11/21 ?1601 11/11/21 ?1711 11/11/21 ?2215 13-Nov-2021 ?0112  ?HGB 6.9*  7.0*  --   --   --   --  6.2*  --  6.7*  --   --   --  7.0*  ?HCT 24.6*  24.4*  --   --   --   --  20.5*  --   --   --   --   --  22.8*  ?PLT 355  347  --   --   --   --  336  --   --   --   --   --  210  ?APTT  --    < > 29  --  63*  --  150*  --  193*  --   --  >200*  ?LABPROT  --   --  19.0*  --   --   --   --   --   --   --   --   --   ?INR  --   --  1.6*  --   --   --   --   --   --   --   --   --   ?HEPARINUNFRC  --   --   --   --  >1.10*  --  >1.10*  --   --   --   --   --   ?CREATININE 0.67   < >  --   --   --  1.67*  --   --   --  2.48* 2.81*  --   ?TROPONINIHS  --   --  237* 280*  --   --   --   --   --   --   --   --   ? < > = values in this interval not displayed.  ? ? ? ?Estimated Creatinine Clearance: 25 mL/min (A) (by C-G formula based on SCr of 2.81 mg/dL (H)). ? ? ?Medications:  ?NKDA; Heparin Dosing Weight: 82.2kg ?PTA: None ?Inpatient: Eliquis 5mg  BID (4/28-5/08 0900) > Heparin gtt ? ?Assessment: ?[name] w/ h/o HFpEF, COPD, and HTN admitted with acute on chronic hypoxic and hypercapnic respiratory failure secondary to COPDE and parainfluenza who is being evaluated for Afib with RVR. Pharmacy has been consulted for conversion of Eliquis started this admission to heparin gtt. ? ?5/08 - PCCM: "Patient with left pleural effusion. Will hold eliquis  and start heparin drip in anticipation of thoracentesis to drain left pleural effusion" ? ?Date Time aPTT/HL Rate/Comment ?5/08  2343 63s / >1.1 Subthera aPTT; 1250 > 1400 un/hr ?5/09 0720 150s / >1.1 Supratherapeutic aPTT ?5/09 1607 193   Supratherapeutic ?5/10    0112    >200               Supratherapeutic  ?Baseline Labs: ?aPTT - 29s ?INR - 1.6 (on eliquis) ?Hgb - 16.1>13.6>10.9>10.2 ?Plts - 279>300>208>252 ? ?Goal of Therapy:  ?Heparin  level 0.3-0.7 units/ml ?aPTT 66-102 seconds ?Monitor platelets by anticoagulation protocol: Yes ?  ?Plan:  ?DOAC lab interference still present, last aPTT subthera and dose was inc'd ~2un/k/h, but now supratherapeutic at >150s. Will hold x1 hr and reduce to 1300 un/hr based on prior response. ? ?Results: ?5/10: aPTT @ 0112 = > 200 ?Will hold heparin drip for 1 hr and restart @ 1000 units/hr.  ?Will recheck aPTT and HL 8 hrs after rate change.  ? ?Jimmye Wisnieski D ?11-18-2021 3:16 AM ? ? ? ?

## 2021-12-04 NOTE — Progress Notes (Signed)
eLink Physician-Brief Progress Note ?Patient Name: Virginia Norman ?DOB: [birth year] ?MRN: [id number] ? ? ?Date of Service ? 11-21-2021  ?HPI/Events of Note ? K+ resulted at 7.5 despite prior aggressive treatment per hyperkalemia treatment protocol, lab is reporting possible hemolysis affecting the results.  ?eICU Interventions ? Will re-send BMP, in the meantime will treat for hyperkalemia per protocol.  ? ? ? ?  ? ?Migdalia Dk ?Nov 21, 2021, 5:28 AM ?

## 2021-12-04 NOTE — Progress Notes (Signed)
? ?NAME:  Murlene Revell, MRN:  [id number], DOB:  Jul 21, 1960, LOS: 15 ?ADMISSION DATE:  11/06/21, CONSULTATION DATE:  11/03/21 ?REFERRING MD:  Cipriano Bunker, MD CHIEF COMPLAINT:  Respiratory Failure  ? ?History of Present Illness:  ?Is a 61 year old woman with a medical history of CHF with preserved ejection fraction, COPD and ongoing tobacco abuse, who was admitted on 29 October 2021 with acute on chronic respiratory failure with hypoxia and hypercarbia.  She has been managed in the progressive care unit.  She presented with a working diagnosis of decompensation CHF.  She received diuretics until 4/30.  She was transferred to the intensive care unit due to need for noninvasive ventilation and encephalopathy likely due to her respiratory failure.  She was noted to have severe metabolic derangements to include very low chloride, very high bicarbonate and high PaCO2.  She has significant hemoconcentration on CBC.  We are asked to assist in her management. ? ?Pertinent  Medical History  ?COPD ?CHF with preserved EF ?CAD ?HTN ? ?Significant Hospital Events: ?Including procedures, antibiotic start and stop dates in addition to other pertinent events   ?11/02/2021 transferred to ICU due to severe hypercapnia and severe metabolic derangements ?11/03/2021 requiring AVAPS, stopped all diuretics, Diamox, sodium chloride infusion ?11/03/2021 patient required intubation in the evening CT head negative for acute CNS process ?11/04/2021 on mechanical ventilation ?5/3 remains on vent plan for weaning trial ?5/4 remains on vent severe COPD ?5/5 failure to wean from vent, failed SAT/SBT ?5/5 s/p bronch bloody mucus plugs aspirated-cultures sent ?5/6 severe Hypoxia unable to wean, husband updated ?5/7 severe Hypoxia ?5/8 paralytic drip, ARDS worsening ? ?Interim History / Subjective:  ? ?Patient continues to decline. ? ?Family has decided to transition to comfort care. ? ?Objective   ?Blood pressure (!) 66/35, pulse 74, temperature (!)  101.7 ?F (38.7 ?C), resp. rate (!) 26, height 5' 7.01" (1.702 m), weight 106.8 kg, SpO2 (!) 72 %. ?CVP:  [13 mmHg-26 mmHg] 13 mmHg  ?Vent Mode: PRVC ?FiO2 (%):  [100 %] 100 % ?Set Rate:  [26 bmp] 26 bmp ?Vt Set:  [420 mL] 420 mL ?PEEP:  [10 cmH20] 10 cmH20  ? ?Intake/Output Summary (Last 24 hours) at 11/30/2021 0659 ?Last data filed at 11/23/2021 9528 ?Gross per 24 hour  ?Intake 10604.36 ml  ?Output 395 ml  ?Net 10209.36 ml  ? ?Filed Weights  ? 11/10/21 0500 11/11/21 0500 11/04/2021 0500  ?Weight: 94.4 kg 98.1 kg 106.8 kg  ? ?Examination: ?General: critically ill woman, intubated, sedated ?HENT: [place]/AT, moist mucous membranes, ET tube in place ?Lungs: course diminished breath sounds, no wheezing.  ?Cardiovascular: tachycardic, irregularly irregular, no murmurs ?Abdomen: soft, non-distended, BS+ ?Extremities: cool to touch, trace edema ?Neuro: sedated, PERRL ?GU: no foley ? ?Resolved Hospital Problem list   ? ? ?Assessment & Plan:  ? 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 ?Plan: ?Discussed goals of care with family this morning and we will plan to transition to comfort care and peaceful withdrawal of care. Orders have been placed. Nurse updated at the bedside of plan. ? ?Best Practice (right click and "Reselect all SmartList Selections" daily)  ? ?Diet/type: tubefeeds ?DVT prophylaxis: DOAC ?GI prophylaxis: PPI ?Lines: Central line, Arterial Line, and yes and it is  still needed ?Foley:  N/A ?Code Status:  full code ?Last date of multidisciplinary goals of care discussion [5/10, updated patient's husband Fayrene FearingJames at the bedside] ? ?Labs   ?CBC: ?Recent Labs  ?Lab 11/09/21 ?0452  11/10/21 ?[id number] 11/11/21 ?0447 11/11/21 ?1348 08-16-21 ?0112 08-16-21 ?0436  ?WBC 25.9* 38.3*  39.0* 56.7*  --  56.4* 63.0*  ?NEUTROABS  --  28.0*  --   --   --   --   ?HGB 7.8* 6.9*  7.0* 6.2* 6.7* 7.0* 7.2*  ?HCT 26.6* 24.6*  24.4* 20.5*  --  22.8* 23.1*  ?MCV 100.4* 103.4*  103.0* 100.0  --  99.1 98.3  ?PLT 308 355  347 336  --  210 207  ? ? ?Basic Metabolic Panel: ?Recent Labs  ?Lab 11/06/21 ?[id number] 11/07/21 ?[id number] 11/08/21 ?[id number] 11/08/21 ?2132 11/09/21 ?[id number] 11/10/21 ?[id number] 11/10/21 ?1630 11/11/21 ?0447 11/11/21 ?1711 11/11/21 ?2215 08-16-21 ?0436 08-16-21 ?[id number]  ?NA 135 136   < >  --  143 143 140 138 137 136  --  136  ?K 4.4 4.7   < >  --  4.7 4.9 5.3* 5.5* 6.4* 6.5* >7.5* >7.5*  ?CL 93* 91*   < >  --  96* 97* 94* 93* 91* 95*  --  96*  ?CO2 39* 37*   < >  --  41* 41* 42* 38* 35* 30  --  28  ?GLUCOSE 144* 202*   < >  --  183* 168* 215* 143* 192* 148*  --  120*  ?BUN 34* 52*   < >  --  58* 70* 88* 105* 116* 112*  --  123*  ?CREATININE 0.60 0.76   < >  --  0.56 0.67 1.06* 1.67* 2.48* 2.81*  --  3.26*  ?CALCIUM 7.2* 7.9*   < >  --  7.4* 7.3* 7.4* 7.2* 7.2* 6.6*  --  6.4*  ?MG 2.0  --   --  2.7* 2.8* 2.8*  --   --   --   --   --   --   ?PHOS 2.8 2.7  --  1.9* 3.9 2.2*  --  4.3  --   --   --   --   ? < > = values in this interval not displayed.  ? ?GFR: ?Estimated Creatinine Clearance: 22.5 mL/min (A) (by C-G formula based on SCr of 3.26 mg/dL (H)). ?Recent Labs  ?Lab 11/08/21 ?[id number] 11/09/21 ?[id number] 11/10/21 ?[id number] 11/11/21 ?[id number] 08-16-21 ?0112 08-16-21 ?[id number]  ?PROCALCITON  --  0.18 0.19 0.87  --   --   ?WBC 21.7* 25.9* 38.3*  39.0* 56.7* 56.4* 63.0*  ?LATICACIDVEN 1.2  --   --   --   --   --   ? ? ?Liver Function Tests: ?Recent Labs  ?Lab 11/09/21 ?0452 11/10/21 ?[id number] 11/11/21 ?1711  ?AST 47* 58* 526*  ?ALT 61* 70* 513*  ?ALKPHOS 41 35* 37*  ?BILITOT 0.7 0.9 2.1*  ?PROT 5.2* 4.8* 4.6*  ?ALBUMIN 2.6* 2.5* 2.4*  ? ?No results for input(s): LIPASE, AMYLASE in the last 168 hours. ?No results for input(s): AMMONIA in the  last 168 hours. ? ?ABG ?   ?Component Value Date/Time  ? PHART 7.23 (L) 11/11/2021 0857  ? PCO2ART 89 (HH) 11/11/2021 0857  ? PO2ART 80 (L) 11/11/2021 0857  ? HCO3 37.3 (H) 11/11/2021 0857  ? O2SAT 97.7 11/11/2021 0857  ?  ? ?Coagulation Profile: ?Recent Labs  ?Lab 11/10/21 ?1755  ?INR 1.6*  ? ? ?Cardiac Enzymes: ?  No results for input(s): CKTOTAL, CKMB, CKMBINDEX, TROPONINI in the last 168 hours. ? ?HbA1C: ?Hgb A1c MFr Bld  ?Date/Time Value Ref Range Status  ?07/17/2021 06:23 AM 5.8 (H) 4.8 - 5.6 % Final  ?  Comment:  ?  (NOTE) ?Pre diabetes:          5.7%-6.4% ? ?Diabetes:              >6.4% ? ?Glycemic control for   <7.0% ?adults with diabetes ?  ?10/03/2019 04:45 AM 6.3 (H) 4.8 - 5.6 % Final  ?  Comment:  ?  (NOTE) ?Pre diabetes:          5.7%-6.4% ?Diabetes:              >6.4% ?Glycemic control for   <7.0% ?adults with diabetes ?  ? ? ?CBG: ?Recent Labs  ?Lab 11/11/21 ?1546 11/11/21 ?1927 11/11/21 ?2357 Nov 14, 2021 ?0205 2021/11/14 ?8115  ?GLUCAP 181* 152* 149* 151* 143*  ?  ? ?Critical care time: 35 minutes  ? ?Melody Comas, MD ?[facility] ?Office: [phone] ? ? ?See Amion for personal pager ?PCCM on call pager [phone] until 7pm. ?Please call Elink 7p-7a. [phone] ? ? ?  ?

## 2021-12-04 NOTE — Progress Notes (Signed)
Pharmacy Antibiotic Note ? ?Virginia Norman is a 61 y.o. female admitted on 10/22/2021 with pneumonia.  Pharmacy has been consulted for vancomycin and meropenem dosing. Patient with increasing need for vasopressors.  Respiratory virus panel on 4/26 detected parainfluenza 3 virus.  Tracheal aspirate from 5/2 with possible mold (suspected contaminant) sent for identification.  Bronch performed 5/5 but culture revealed normal flora. ID consulted 5/8 ? ?Today, 12/05/2021 ?Day 8 antibiotics, Day 1 vancomycin/meropenem/doxycyline ?WBC 56.7- trending up ?Renal: SCr > doubled in 24h ?PCT 0.19 to 0.87 ?+ fevers ?Remains vent-dependent ?Norepinephrine added to phenylephrine and vasopressin ?Beta-D-glucan pending ?Amiodarone gtt stopped ?Plan: ?Stop maintenance dose vancomycin with significant rise in SCr in 24h.  Check random vancomycin level 5/10 with am labs.  Last dose given at 05:18 ?Adjust Meropenem 1gm IV q12h for worsening renal function ?Follow renal function closely and urine output. ?Follow cultures ?Check vancomycin levels if vancomycin continued > 4-5 days or sooner per renal function ? ?5/10: Vanc random @ 0436 = 20, therapeutic ?Will order Additional Vancomycin 750 mg IV X 1 and recheck vanc level on 5/11 @ 0700.  ? ?Height: 5' 7.01" (170.2 cm) ?Weight: 98.1 kg (216 lb 4.3 oz) ?IBW/kg (Calculated) : 61.62 ? ?Temp (24hrs), Avg:102.2 ?F (39 ?C), Min:100.9 ?F (38.3 ?C), Max:103.6 ?F (39.8 ?C) ? ?Recent Labs  ?Lab 11/08/21 ?9678 11/09/21 ?9381 11/10/21 ?0175 11/10/21 ?1630 11/11/21 ?0447 11/11/21 ?1711 11/11/21 ?2215 12/05/21 ?0112 05-Dec-2021 ?0436  ?WBC 21.7* 25.9* 38.3*  39.0*  --  56.7*  --   --  56.4* 63.0*  ?CREATININE 0.59 0.56 0.67 1.06* 1.67* 2.48* 2.81*  --   --   ?LATICACIDVEN 1.2  --   --   --   --   --   --   --   --   ?VANCORANDOM  --   --   --   --   --   --   --   --  20  ? ?  ?Estimated Creatinine Clearance: 25 mL/min (A) (by C-G formula based on SCr of 2.81 mg/dL (H)).   ? ?No Known  Allergies ? ?Antimicrobials this admission: ?5/2 Vanc + Cefepime >> 5/4 ?5/4 Unasyn >> 5/8 ?5/8 Anidulafungin x 1 ?5/8 Doxycycline >> ?5/8 vanco >> ?5/8 meropenem >> ? ?Dose adjustments this admission: ? ? ?Microbiology results: ?5/8 blood cx: ?5/5 respiratory cx: Normal flora ?5/2 Resp cx: rare mold ?4/30 PCR: negative ? ?Thank you for allowing pharmacy to be a part of this patient?s care. ? ?Juliette Alcide, PharmD, BCPS, BCIDP ?Work Cell: [phone] ?12/05/2021 6:16 AM ? ? ? ?

## 2021-12-04 NOTE — Progress Notes (Signed)
2055- Pt's O2 sats are in the low 80's and have been most of the day. Elink called seeking clarification on goals. Family at bedside said that they do not want any escalation of care. Elink made aware. Awaiting orders/direction.  ? ?2135- All pressors maxed out. Elink made aware. ?

## 2021-12-04 NOTE — Death Summary Note (Signed)
?DEATH SUMMARY  ? ?Patient Details  ?Name: Virginia Norman ?MRN: [id number] ?DOB: Jul 15, 1960 ? ?Admission/Discharge Information  ? ?Admit Date:  02-Nov-2021  ?Date of Death: Date of Death: Nov 17, 2021  ?Time of Death: Time of Death: 1108/10/17  ?Length of Stay: 15  ?Referring Physician: [facility]  ? ?Reason(s) for Hospitalization  ?Respiratory Failure ? ?Diagnoses  ?Preliminary cause of death:  ?Sepsis due to Parainfluenza Pneumonia ? ?Secondary Diagnoses (including complications and co-morbidities):  ?Principal Problem: ?  Acute respiratory failure with hypoxia and hypercapnia (HCC) ?Active Problems: ?  Benign essential HTN ?  COPD exacerbation ([place]) ?  Chronic diastolic CHF (congestive heart failure) ([place]) ?  Paroxysmal atrial fibrillation (HCC) ?Acute Respiratory Distress Syndrome ?Left Pleural Effusion ?Shock, Multifactorial ?Acute Metabolic Encephalopathy ?Acute Renal Failure ?Hyperkalemia ? ?Brief Hospital Course (including significant findings, care, treatment, and services provided and events leading to death)  ?Virginia Norman is a 61 y.o. year old female with history of COPD, severe kyphosis with restrictive defect, RBILD, CHF, paroxysmal atrial fibrillation who was admitted 10/29/21 with acute respiratory failure due to parainfluenza pneumonia and severe metabolic derangements. She was transferred to the ICU on 4/30 and placed on AVAPS. She was later intubated on 5/1. She developed progressive respiratory failure with development of ARDS with dense right lung infiltrates and less dense left lung infiltrates. She underwent bronchoscopy on 5/5 with removal of bloody mucous plug. She was treated with broad spectrum antibiotics and later antifungal coverage without improvement in her respiratory function. She required continuous paralytic drip for ARDS. She was not able to be proned due to tenuous hemodynamic status. Palliative care was consulted on 5/8. She developed acute renal failure with severe  hyperkalemia in setting of ARDS. Discussion was held with family about dialysis and they did not wish to move forward with dialysis given her severe lung disease. Patient passed away on Nov 17, 2021 at 1110.  ? ? ? ?Pertinent Labs and Studies  ?Significant Diagnostic Studies ?DG Chest 1 View ? ?Result Date: 11/08/2021 ?CLINICAL DATA:  Follow-up bilateral airspace disease, ventilator dependent respiratory failure. EXAM: CHEST  1 VIEW COMPARISON:  Portable chest yesterday at 1:07 a.m. FINDINGS: 5:11 a.m., 11/08/2021. Widespread patchy dense bilateral airspace disease continues to be seen on the right-greater-than-left with moderate underlying pleural effusions. The cardiac size is stable. Central vascular distension appears similar. There is aortic atherosclerosis, stable mediastinum. Right IJ central line terminates in the distal SVC, ETT tip remains 4 cm from the carina, NGT tip likely in the body of stomach based on the location of the proximal side-hole. Pneumothorax is not seen. Today there is increasingly dense patchy consolidation in the left upper lung field, slight increased confluent airspace disease in the right upper lung field, with no essential change in the bases. IMPRESSION: 1. Diffuse airspace disease with increasing opacities in the right more so than left upper lobes, no change in aeration of the lower lung zones and moderate pleural effusions. 2. Findings could be due to worsening edema, pneumonia or combination. Cardiac size is stable. Electronically Signed   By: Telford Nab M.D.   On: 11/08/2021 06:56  ? ?DG Chest 1 View ? ?Result Date: 11/07/2021 ?CLINICAL DATA:  Check central line placement EXAM: PORTABLE CHEST 1 VIEW COMPARISON:  11/04/2021 FINDINGS: Cardiac shadow is stable. Endotracheal tube, gastric catheter and left jugular central line are again seen and stable. No pneumothorax is noted. Increasing airspace opacity is noted particularly in the right lung consistent with worsening edema.  Superimposed infiltrate may  present in this fashion as well. No bony abnormality is seen. IMPRESSION: Tubes and lines as described above. Increasing airspace opacity particularly on the right consistent with worsening edema. Superimposed infiltrate could not be totally excluded. Electronically Signed   By: Inez Catalina M.D.   On: 11/07/2021 01:16  ? ?DG Chest 2 View ? ?Result Date: 11/01/2021 ?CLINICAL DATA:  Shortness of breath. Bilateral foot swelling. Patient reports history of CHF, COPD, hypertension and prior smoking history. EXAM: CHEST - 2 VIEW COMPARISON:  Radiograph and CT 07/16/2021 FINDINGS: Upper normal heart size. Unchanged mediastinal contours with aortic atherosclerosis. Previous patchy airspace disease has resolved with minimal residual subsegmental atelectasis at the left lung base. No pulmonary edema. No pleural effusion. No pneumothorax. Chronic changes in the thoracic spine. IMPRESSION: No acute abnormality. Previous patchy airspace disease has essentially resolved with minimal residual subsegmental atelectasis at the left lung base. Electronically Signed   By: Keith Rake M.D.   On: 10/31/2021 20:52  ? ?DG Abd 1 View ? ?Result Date: 11/10/2021 ?CLINICAL DATA:  Abdominal distention. EXAM: ABDOMEN - 1 VIEW COMPARISON:  None Available. FINDINGS: The bowel gas pattern is nonobstructive with mild-to-moderate stool retention large intestine. NGT is in place with the tip projected at the body of the stomach angled slightly left. No radio-opaque calculi or other significant radiographic abnormality are seen, but there is limited image detail in the abdomen due to the patient not being able to lie flat. IMPRESSION: 1. NGT tip in the body of stomach. 2. Constipation with no acute radiographic findings.  Limited exam. Electronically Signed   By: Telford Nab M.D.   On: 11/10/2021 05:32  ? ?CT HEAD WO CONTRAST (5MM) ? ?Result Date: 11/03/2021 ?CLINICAL DATA:  Altered mental status EXAM: CT HEAD WITHOUT  CONTRAST TECHNIQUE: Contiguous axial images were obtained from the base of the skull through the vertex without intravenous contrast. RADIATION DOSE REDUCTION: This exam was performed according to the departmental dose-optimization program which includes automated exposure control, adjustment of the mA and/or kV according to patient size and/or use of iterative reconstruction technique. COMPARISON:  None. FINDINGS: Brain: There is no mass, hemorrhage or extra-axial collection. The size and configuration of the ventricles and extra-axial CSF spaces are normal. The brain parenchyma is normal, without acute or chronic infarction. Dilated perivascular space inferior to the left lentiform nucleus. Vascular: No abnormal hyperdensity of the major intracranial arteries or dural venous sinuses. No intracranial atherosclerosis. Skull: The visualized skull base, calvarium and extracranial soft tissues are normal. Sinuses/Orbits: Right ethmoid opacification. Mastoids are clear. The orbits are normal. IMPRESSION: No acute intracranial abnormality. Electronically Signed   By: Ulyses Jarred M.D.   On: 11/03/2021 22:26  ? ?DG Chest Port 1 View ? ?Result Date: 11/11/2021 ?CLINICAL DATA:  Former smoker respiratory failure EXAM: PORTABLE CHEST 1 VIEW COMPARISON:  Chest x-ray dated Nov 09, 2021 FINDINGS: Cardiac and mediastinal contours are unchanged. ETT projects over the midthoracic trachea. Left IJ line is at the junction of the brachiocephalic and SVC. Enteric tube partially seen coursing below the diaphragm. Bilateral heterogeneous pulmonary opacities, appear worsened increased in the left hemithorax. Small bilateral pleural effusions. No evidence of pneumothorax. IMPRESSION: 1. Stable support devices. 2. Bilateral heterogeneous pulmonary opacities, appear worsened increased in the left hemithorax. 3. Small bilateral pleural effusions. Electronically Signed   By: Yetta Glassman M.D.   On: 11/11/2021 08:09  ? ?DG Chest Port 1  View ? ?Result Date: 11/09/2021 ?CLINICAL DATA:  Respiratory failure.  History of  CHF. EXAM: PORTABLE CHEST 1 VIEW COMPARISON:  Chest radiograph dated 11/08/2021. FINDINGS: Endotracheal tube with tip approximately 4.5 c

## 2021-12-04 NOTE — Progress Notes (Signed)
? ?Progress Note ? ?Patient Name: Virginia Norman ?Date of Encounter: 2021/12/09 ? ?[facility] Cardiologist: Ida Rogue, MD  ? ?Subjective  ? ?Intubated, sedated, family at bedside, goals of care discussions underway.  Patient on multiple pressors, hemodynamically unstable. ? ?Inpatient Medications  ?  ?Scheduled Meds: ? sodium chloride   Intravenous Once  ? artificial tears  1 application. Both Eyes Q8H  ? chlorhexidine gluconate (MEDLINE KIT)  15 mL Mouth Rinse BID  ? Chlorhexidine Gluconate Cloth  6 each Topical Daily  ? docusate  100 mg Per Tube BID  ? free water  30 mL Per Tube Q4H  ? furosemide  40 mg Intravenous Q12H  ? insulin aspart  0-20 Units Subcutaneous Q4H  ? ipratropium-albuterol  3 mL Nebulization Q6H  ? mouth rinse  15 mL Mouth Rinse 10 times per day  ? [START ON 11/13/2021] methylPREDNISolone (SOLU-MEDROL) injection  20 mg Intravenous Daily  ? pantoprazole sodium  40 mg Per Tube Daily  ? polyethylene glycol  17 g Oral BID  ? senna  2 tablet Per Tube BID  ? sodium chloride flush  10-40 mL Intracatheter Q12H  ? sodium zirconium cyclosilicate  10 g Oral TID  ? vancomycin variable dose per unstable renal function (pharmacist dosing)   Does not apply See admin instructions  ? ?Continuous Infusions: ? dextrose    ? doxycycline (VIBRAMYCIN) IV Stopped (2021-12-09 0020)  ? feeding supplement (VITAL AF 1.2 CAL) Stopped (12/09/21 0435)  ? fentaNYL infusion INTRAVENOUS    ? heparin 1,000 Units/hr (12-09-2021 0800)  ? meropenem (MERREM) IV 1 g (12-09-21 1005)  ? midazolam    ? norepinephrine (LEVOPHED) Adult infusion 40 mcg/min (12-09-2021 0810)  ? phenylephrine (NEO-SYNEPHRINE) Adult infusion 400 mcg/min (2021-12-09 0800)  ? propofol (DIPRIVAN) infusion 35 mcg/kg/min (Dec 09, 2021 0800)  ? vasopressin 0.03 Units/min (12-09-2021 0800)  ? vecuronium (NORCURON) infusion 161m/100mL (1 mg/mL) 1 mcg/kg/min (0Jun 06, 20230800)  ? ?PRN Meds: ?acetaminophen **OR** acetaminophen, bisacodyl, diphenhydrAMINE, fentaNYL, fentaNYL  (SUBLIMAZE) injection, glycopyrrolate **OR** glycopyrrolate **OR** glycopyrrolate, magnesium hydroxide, metoprolol tartrate, midazolam, midazolam, ondansetron **OR** ondansetron (ZOFRAN) IV, polyvinyl alcohol, sodium chloride flush  ? ?Vital Signs  ?  ?Vitals:  ? [id number]/06/230614 [id number]  ?BP:    (!) 61/12  ?Pulse:   74 73  ?Resp:   (!) 26 (!) 26  ?Temp:   (!) 100.8 ?F (38.2 ?C) (!) 100.6 ?F (38.1 ?C)  ?TempSrc:    Esophageal  ?SpO2:  (!) 72% (!) 71% (!) 69%  ?Weight: 106.8 kg     ?Height:      ? ? ?Intake/Output Summary (Last 24 hours) at 5Jun 06, [id number] ?Last data filed at [id number] ?Gross per 24 hour  ?Intake 7853.84 ml  ?Output 95 ml  ?Net 7758.84 ml  ? ? ?  52023/06/06 ?  5:00 AM 11/11/2021  ?  5:00 AM 11/10/2021  ?  5:00 AM  ?Last 3 Weights  ?Weight (lbs) 235 lb 7.2 oz 216 lb 4.3 oz 208 lb 1.8 oz  ?Weight (kg) 106.8 kg 98.1 kg 94.4 kg  ?   ? ?Telemetry  ?  ?Sinus rhythm- Personally Reviewed ? ?ECG  ?  ?No new tracing obtained personally Reviewed ? ?Physical Exam  ? ?GEN: Intubated, sedated ?Neck: No JVD ?Cardiac: RRR,  ?Respiratory: Vented breath sounds ?GI: Soft, nontender, distended  ?MS: 1+ edema; No deformity. ?Neuro: Unable to assess ?Psych: Unable to assess ? ?Labs  ?  ?High Sensitivity Troponin:   ?Recent Labs  ?  Lab 10/21/2021 ?1904 10/27/2021 ?2118 11/08/21 ?6144 11/10/21 ?1755 11/10/21 ?2013  ?TROPONINIHS 18* 19* 138* 237* 280*  ?   ?Chemistry ?Recent Labs  ?Lab 11/08/21 ?2132 11/09/21 ?0452 11/10/21 ?3154 11/10/21 ?1630 11/11/21 ?1711 11/11/21 ?2215 11/21/2021 ?0436 Nov 21, 2021 ?0086  ?NA  --  143 143   < > 137 136  --  136  ?K  --  4.7 4.9   < > 6.4* 6.5* >7.5* >7.5*  ?CL  --  96* 97*   < > 91* 95*  --  96*  ?CO2  --  41* 41*   < > 35* 30  --  28  ?GLUCOSE  --  183* 168*   < > 192* 148*  --  120*  ?BUN  --  58* 70*   < > 116* 112*  --  123*  ?CREATININE  --  0.56 0.67   < > 2.48* 2.81*  --  3.26*  ?CALCIUM  --  7.4* 7.3*   < > 7.2* 6.6*  --  6.4*  ?MG 2.7* 2.8* 2.8*  --   --    --   --   --   ?PROT  --  5.2* 4.8*  --  4.6*  --   --   --   ?ALBUMIN  --  2.6* 2.5*  --  2.4*  --   --   --   ?AST  --  47* 58*  --  526*  --   --   --   ?ALT  --  61* 70*  --  513*  --   --   --   ?ALKPHOS  --  41 35*  --  37*  --   --   --   ?BILITOT  --  0.7 0.9  --  2.1*  --   --   --   ?GFRNONAA  --  >60 >60   < > 21* 18*  --  15*  ?ANIONGAP  --  6 5   < > 11 11  --  12  ? < > = values in this interval not displayed.  ?  ?Lipids  ?Recent Labs  ?Lab 11/11/21 ?0446  ?TRIG 207*  ?  ?Hematology ?Recent Labs  ?Lab 11/11/21 ?0447 11/11/21 ?1348 11-21-2021 ?0112 2021-11-21 ?0436  ?WBC 56.7*  --  56.4* 63.0*  ?RBC 2.05*  --  2.30* 2.35*  ?HGB 6.2* 6.7* 7.0* 7.2*  ?HCT 20.5*  --  22.8* 23.1*  ?MCV 100.0  --  99.1 98.3  ?MCH 30.2  --  30.4 30.6  ?MCHC 30.2  --  30.7 31.2  ?RDW 15.1  --  15.5 15.5  ?PLT 336  --  210 207  ? ?Thyroid No results for input(s): TSH, FREET4 in the last 168 hours.  ?BNP ?Recent Labs  ?Lab 11/10/21 ?0953  ?BNP 1,203.4*  ?  ?DDimer No results for input(s): DDIMER in the last 168 hours.  ? ?Radiology  ?  ?DG Chest Port 1 View ? ?Result Date: 11/11/2021 ?CLINICAL DATA:  Former smoker respiratory failure EXAM: PORTABLE CHEST 1 VIEW COMPARISON:  Chest x-ray dated Nov 09, 2021 FINDINGS: Cardiac and mediastinal contours are unchanged. ETT projects over the midthoracic trachea. Left IJ line is at the junction of the brachiocephalic and SVC. Enteric tube partially seen coursing below the diaphragm. Bilateral heterogeneous pulmonary opacities, appear worsened increased in the left hemithorax. Small bilateral pleural effusions. No evidence of pneumothorax. IMPRESSION: 1. Stable support devices. 2. Bilateral heterogeneous pulmonary opacities, appear  worsened increased in the left hemithorax. 3. Small bilateral pleural effusions. Electronically Signed   By: Yetta Glassman M.D.   On: 11/11/2021 08:09  ? ?ECHOCARDIOGRAM LIMITED ? ?Result Date: 11/10/2021 ?   ECHOCARDIOGRAM LIMITED REPORT   Patient Name:   Virginia Norman Date of Exam: 11/10/2021 Medical Rec #:  [id number]      Height:       67.0 in Accession #:    [id number]     Weight:       208.1 lb Date of Birth:  08-Jun-1961     BSA:          2.057 m? Patient Age:    29 years       BP:           102/72 mmHg Patient Gender: F              HR:           103 bpm. Exam Location:  [facility] Procedure: 2D Echo, Cardiac Doppler and Color Doppler Indications:     Shock  History:         Patient has prior history of Echocardiogram examinations. CHF,                  CAD, COPD, Arrythmias:Atrial Fibrillation; Risk Factors:Former                  Smoker and Hypertension.  Sonographer:     Rosalia Hammers Referring Phys:  [id number] Freddi Starr Diagnosing Phys: Kathlyn Sacramento MD  Sonographer Comments: Suboptimal parasternal window and echo performed with patient supine and on artificial respirator. Image acquisition challenging due to patient body habitus and Image acquisition challenging due to COPD. IMPRESSIONS  1. Left ventricular ejection fraction, by estimation, is 55 to 60%. The left ventricle has normal function. Left ventricular endocardial border not optimally defined to evaluate regional wall motion. Left ventricular diastolic parameters are indeterminate.  2. Right ventricular systolic function is normal. The right ventricular size is normal. There is severely elevated pulmonary artery systolic pressure. The estimated right ventricular systolic pressure is 26.8 mmHg.  3. Moderate pleural effusion in the left lateral region.  4. The mitral valve is normal in structure. No evidence of mitral valve regurgitation. No evidence of mitral stenosis.  5. The aortic valve is normal in structure. Aortic valve regurgitation is not visualized. No aortic stenosis is present.  6. The inferior vena cava is normal in size with <50% respiratory variability, suggesting right atrial pressure of 8 mmHg.  7. Challenging image quality. FINDINGS  Left Ventricle: Left ventricular ejection fraction, by  estimation, is 55 to 60%. The left ventricle has normal function. Left ventricular endocardial border not optimally defined to evaluate regional wall motion. The left ventricular internal cavity size was norma

## 2021-12-04 NOTE — Progress Notes (Signed)
39 Spoke with daughter at bedside encouraging family presence as patient status is declining and she is likely to pass today. Daughter confirmed she will call Fayrene Fearing (pt's husband) and sister to be at bedside along with any other relevant family members.  ? ?1000 Family consented to transition to comfort care. Requesting presence of a grandson prior to transition. Francine Graven, MD notified, spoke with family, comfort care orders placed. ? ?1040 Informed family of change in patient's rhythm. Family aware that patient is likely to die within the hour. Daughter and husband at bedside, other family members off the floor. ? ?1104: Monitor reading asystole, medications turned off, family notified. Family members not at bedside encouraged to return. ? ?1110: Confirmed time of death with 2 RN's. Dewald, Md notified. Emotional support provided to family members. ?

## 2021-12-04 NOTE — Progress Notes (Signed)
PCCM note ? ?Patient has increasing shock, AKI with hyperkalemia.  Maxed out on pressors.  Per earlier discussion today family does not want dialysis ?She is currently limited code with no scope for escalation.  I called and informed her husband Fayrene Fearing and he is aware that she may pass away tonight. ? ?Chilton Greathouse MD ?[facility] ?11-17-2021, 12:35 AM  ?

## 2021-12-04 NOTE — Progress Notes (Signed)
eLink Physician-Brief Progress Note ?Patient Name: Virginia Norman ?DOB: [birth year] ?MRN: [id number] ? ? ?Date of Service ? 12-01-2021  ?HPI/Events of Note ? K+ > 7.5 despite back to back hyperkalemia protocol treatments, family had previously rejected dialysis.  ?eICU Interventions ? I signed the patient out to Dr. Melody Comas this morning.  ? ? ? ?  ? ?Migdalia Dk ?2021/12/01, 7:06 AM ?

## 2021-12-04 NOTE — Progress Notes (Signed)
Nutrition Brief Note  Chart reviewed. Pt now transitioning to comfort care.  No further nutrition interventions planned at this time.  Please re-consult as needed.   Lyah Millirons W, RD, LDN, CDCES Registered Dietitian II Certified Diabetes Care and Education Specialist Please refer to [facility] for RD and/or RD on-call/weekend/after hours pager   

## 2021-12-04 DEATH — deceased

## 2021-12-22 LAB — ACID FAST CULTURE WITH REFLEXED SENSITIVITIES (MYCOBACTERIA): Acid Fast Culture: NEGATIVE

## 2023-08-07 IMAGING — US US ABDOMEN LIMITED
1 series · 14 of 25 positions shown · non-contrast
Comparison: CTA chest 07/17/2021.

CLINICAL DATA: 60-year-old female with abnormal LFTs.

EXAM:
ULTRASOUND ABDOMEN LIMITED RIGHT UPPER QUADRANT

[Series 1: us abdomen limited ruq (liver/gb) · 14 of 36 slices shown]
[im 1/36]
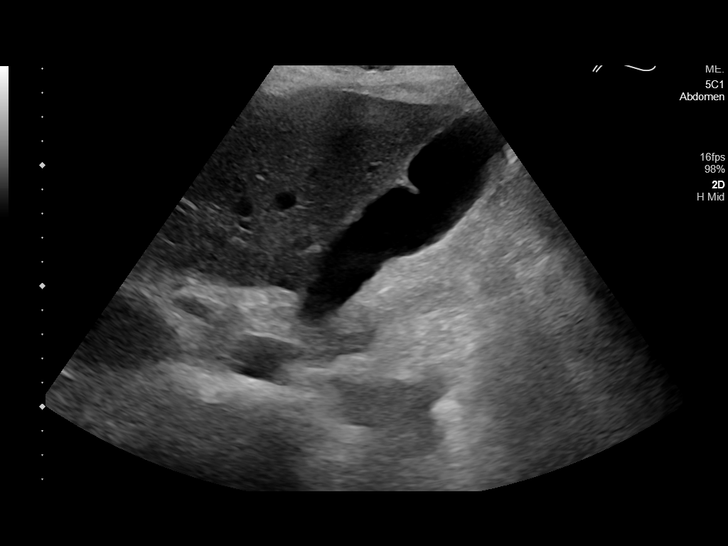
[im 3/36]
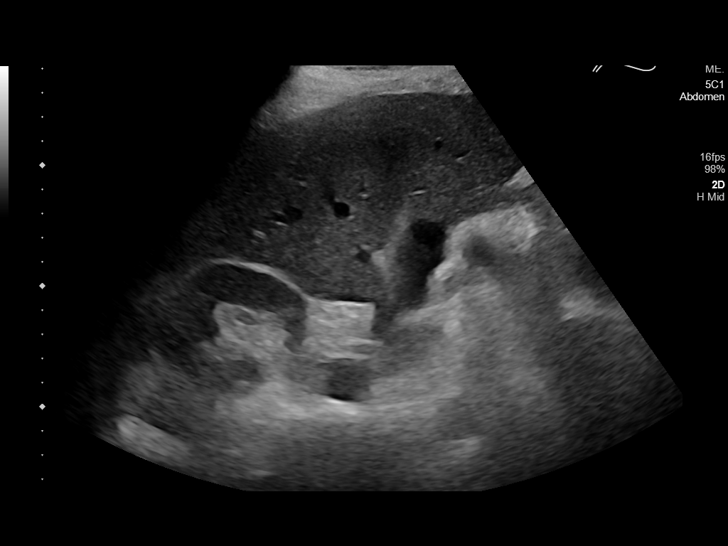
[im 6/36]
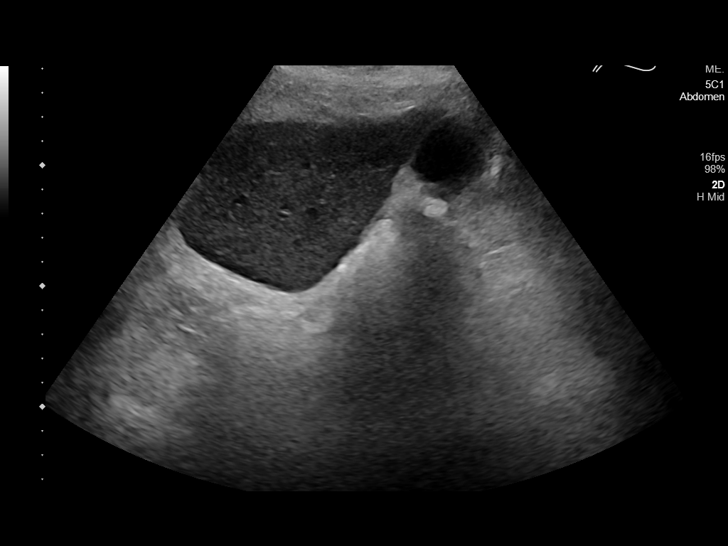
[im 9/36]
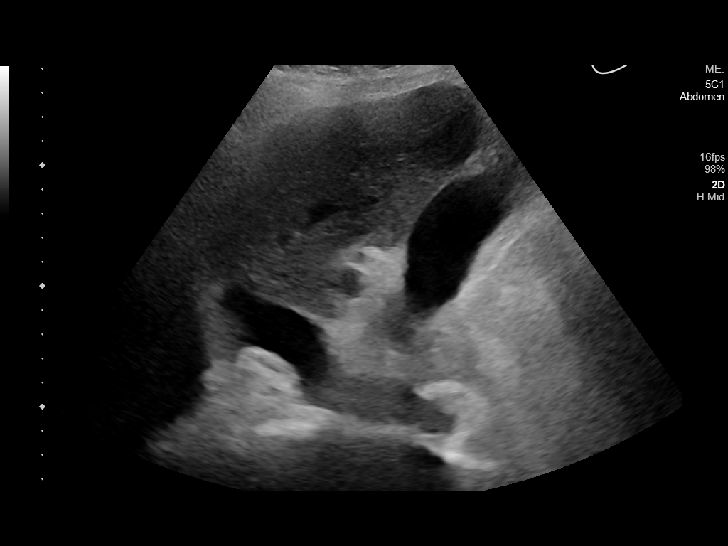
[im 12/36]
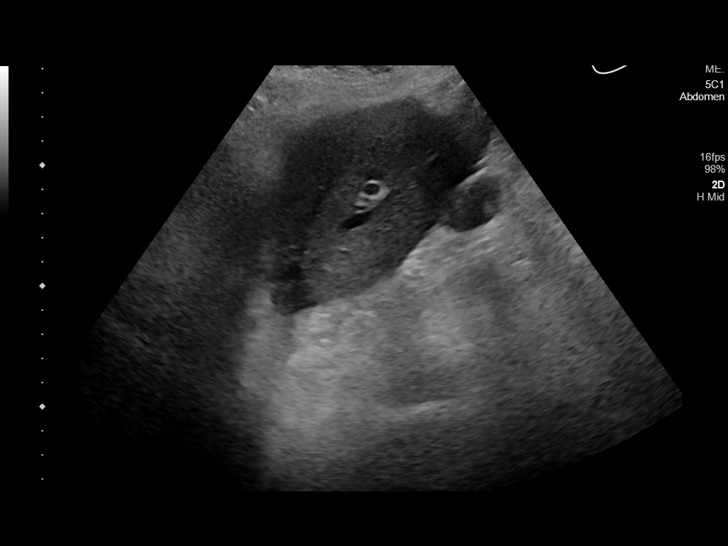
[im 14/36]
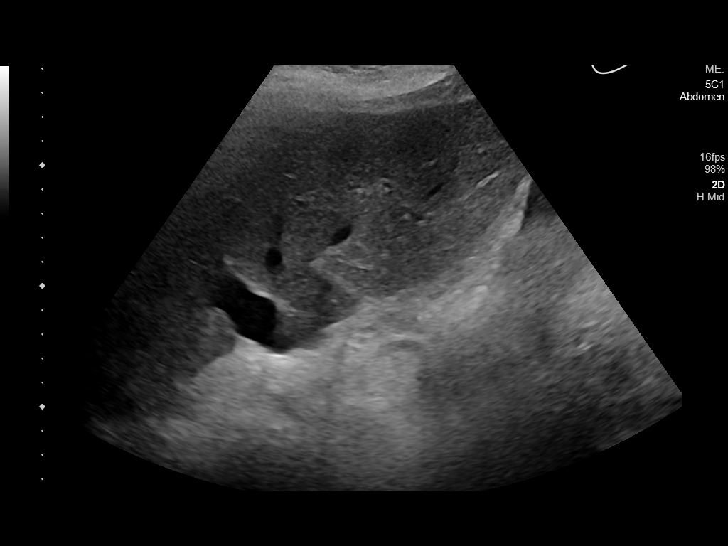
[im 17/36]
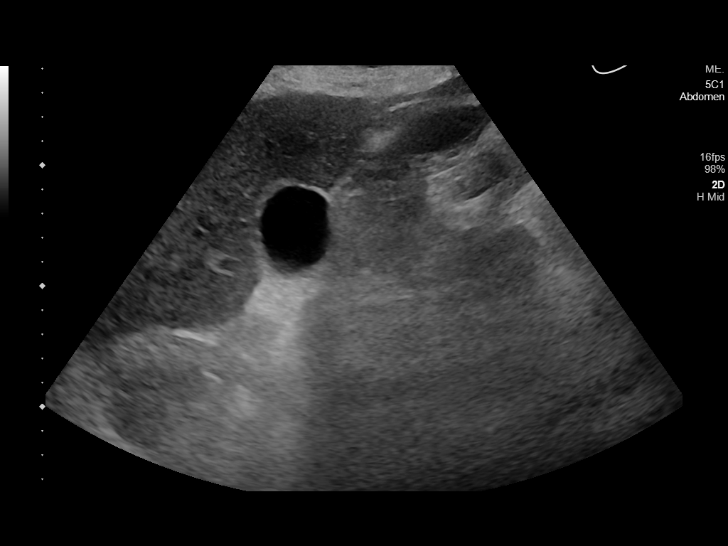
[im 19/36]
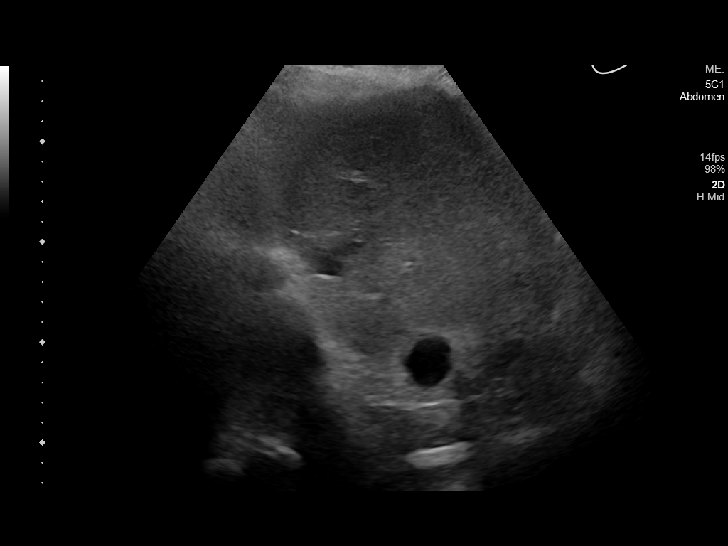
[im 22/36]
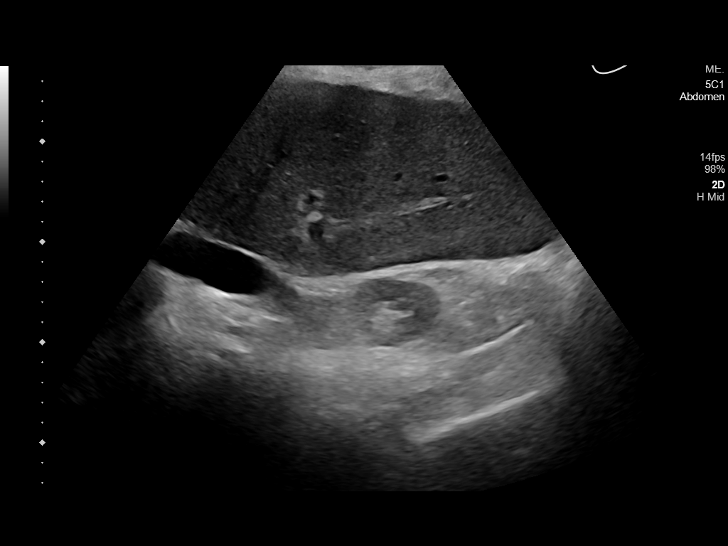
[im 24/36]
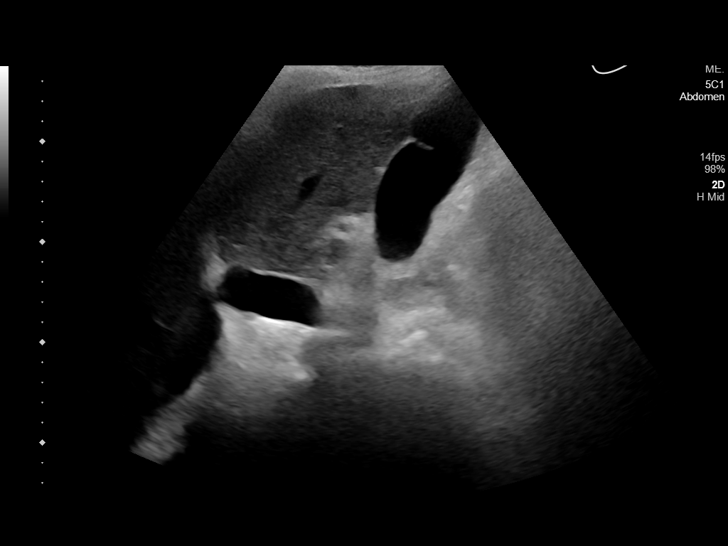
[im 27/36]
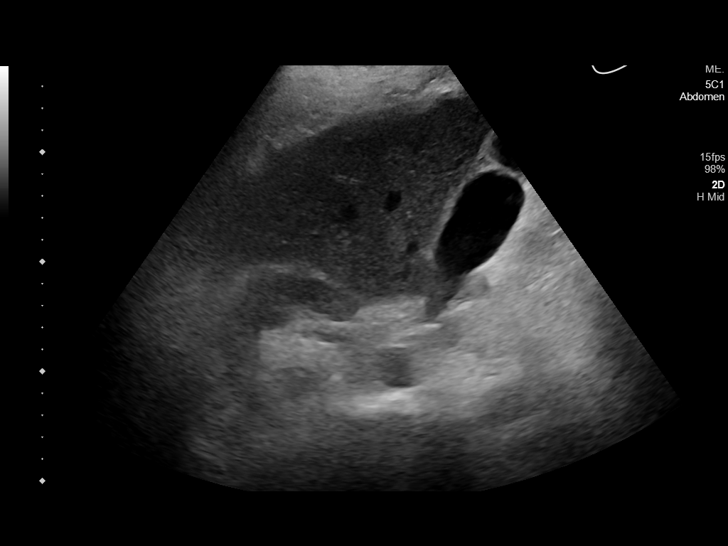
[im 30/36]
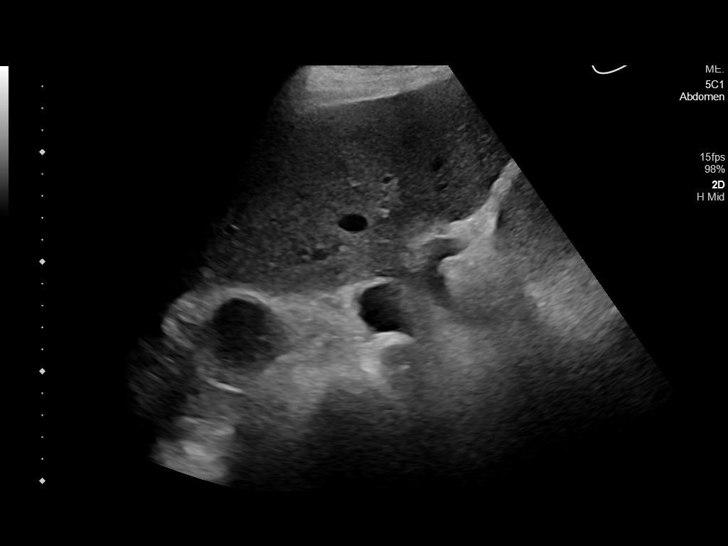
[im 33/36]
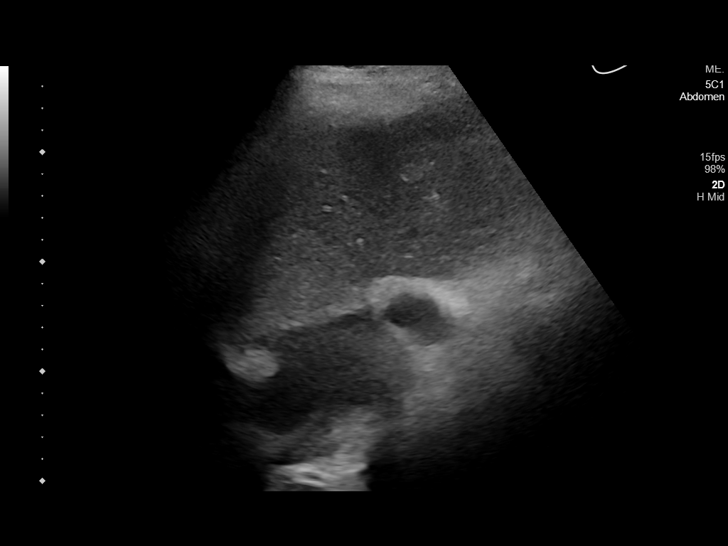
[im 36/36]
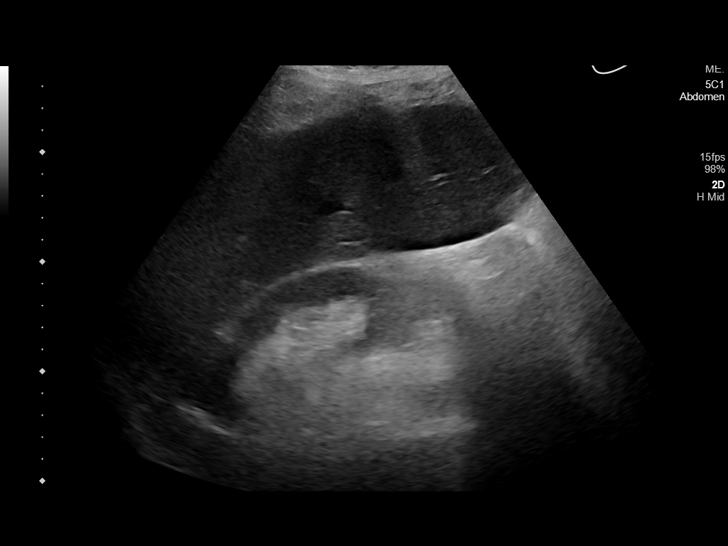

[14 of 25 positions shown; findings below may reference images not displayed]

FINDINGS: Gallbladder:

No gallstones or wall thickening visualized. No sonographic Murphy
sign could be evaluated as the patient is intubated.

Common bile duct:

Diameter: 5 mm, normal.

Liver:

Liver echogenicity is within normal limits. Lobulated liver contour
appears similar to that in [REDACTED] Common no definite liver capsule
nodularity. No discrete liver lesion or intrahepatic biliary ductal
dilatation. Portal vein is patent on color Doppler imaging with
normal direction of blood flow towards the liver.

Other: Negative visible right kidney.  No free fluid.
IMPRESSION: 1. Ultrasound appearance of the liver is within normal limits.
2. Negative gallbladder. No bile duct dilatation.

## 2023-08-08 IMAGING — DX DG CHEST 1V PORT
1 series · 1 of 1 positions shown · non-contrast
Comparison: Chest x-ray dated November 09, 2021

CLINICAL DATA: Former smoker respiratory failure

EXAM:
PORTABLE CHEST 1 VIEW

[chest ap]
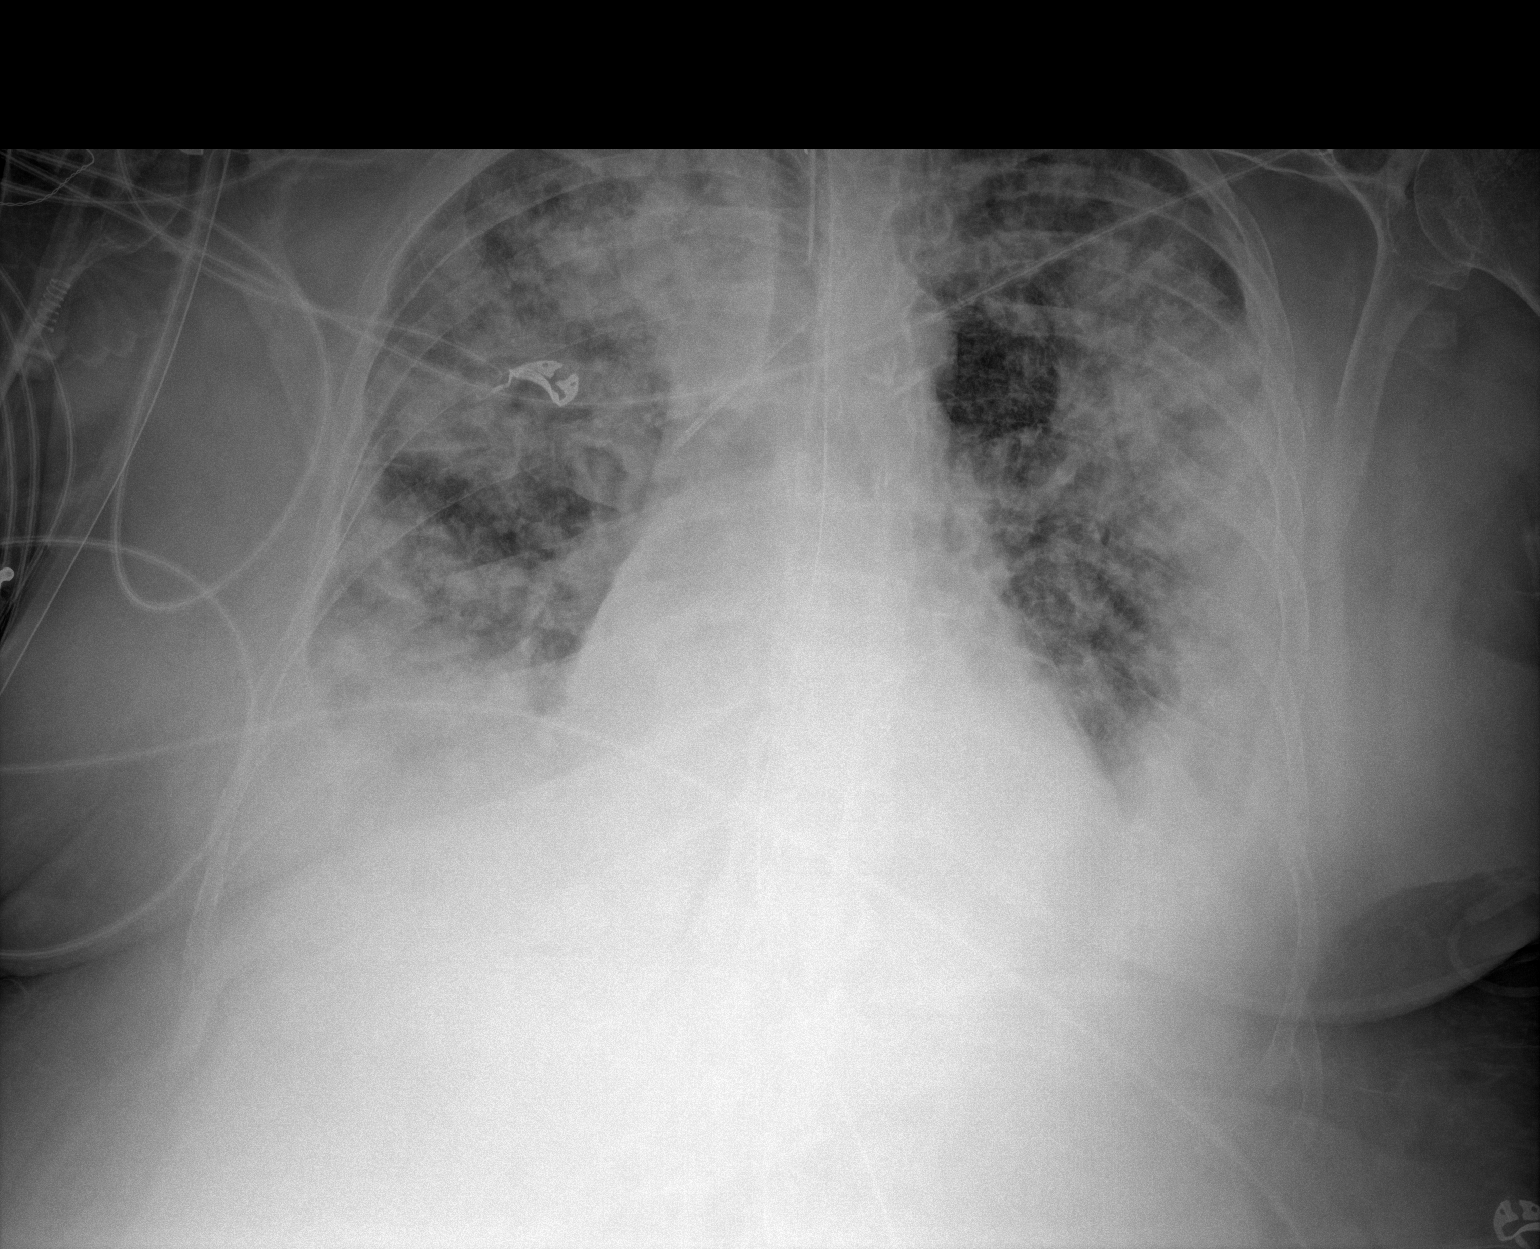

[1 of 1 positions shown; findings below may reference images not displayed]

FINDINGS: Cardiac and mediastinal contours are unchanged. ETT projects over
the midthoracic trachea. Left IJ line is at the junction of the
brachiocephalic and SVC. Enteric tube partially seen coursing below
the diaphragm. Bilateral heterogeneous pulmonary opacities, appear
worsened increased in the left hemithorax. Small bilateral pleural
effusions. No evidence of pneumothorax.
IMPRESSION: 1. Stable support devices.
2. Bilateral heterogeneous pulmonary opacities, appear worsened
increased in the left hemithorax.
3. Small bilateral pleural effusions.
